# Patient Record
Sex: Female | Born: 1950
Health system: Southern US, Community
[De-identification: ages and names within clinical notes are randomized; demographics above are authoritative.]

## PROBLEM LIST (undated history)

## (undated) DIAGNOSIS — G542 Cervical root disorders, not elsewhere classified: Secondary | ICD-10-CM

## (undated) DIAGNOSIS — M940 Chondrocostal junction syndrome [Tietze]: Secondary | ICD-10-CM

## (undated) DIAGNOSIS — M810 Age-related osteoporosis without current pathological fracture: Secondary | ICD-10-CM

## (undated) DIAGNOSIS — M5090 Cervical disc disorder, unspecified, unspecified cervical region: Secondary | ICD-10-CM

## (undated) DIAGNOSIS — K219 Gastro-esophageal reflux disease without esophagitis: Secondary | ICD-10-CM

## (undated) HISTORY — PX: TOOTH EXTRACTION: SUR596

## (undated) HISTORY — DX: Cervical disc disorder, unspecified, unspecified cervical region: M50.90

## (undated) HISTORY — DX: Cervical root disorders, not elsewhere classified: G54.2

## (undated) HISTORY — DX: Chondrocostal junction syndrome (tietze): M94.0

## (undated) HISTORY — DX: Age-related osteoporosis without current pathological fracture: M81.0

## (undated) HISTORY — PX: ESOPHAGOGASTRODUODENOSCOPY (EGD) WITH PROPOFOL: SHX5813

## (undated) HISTORY — PX: GUM SURGERY: SHX658

---

## 2001-10-05 ENCOUNTER — Encounter: Admission: RE | Admit: 2001-10-05 | Discharge: 2001-10-05 | Payer: Self-pay | Admitting: Otolaryngology

## 2001-10-05 ENCOUNTER — Encounter: Payer: Self-pay | Admitting: Otolaryngology

## 2004-11-14 ENCOUNTER — Ambulatory Visit: Payer: Self-pay | Admitting: Gastroenterology

## 2006-01-12 ENCOUNTER — Ambulatory Visit: Payer: Self-pay

## 2007-01-14 ENCOUNTER — Ambulatory Visit: Payer: Self-pay

## 2007-06-23 ENCOUNTER — Emergency Department: Payer: Self-pay | Admitting: Emergency Medicine

## 2007-06-25 ENCOUNTER — Emergency Department: Payer: Self-pay | Admitting: Emergency Medicine

## 2007-10-12 ENCOUNTER — Ambulatory Visit: Payer: Self-pay | Admitting: Family Medicine

## 2009-01-25 ENCOUNTER — Ambulatory Visit: Payer: Self-pay

## 2010-02-26 ENCOUNTER — Ambulatory Visit: Payer: Self-pay | Admitting: Family Medicine

## 2010-04-01 ENCOUNTER — Ambulatory Visit: Payer: Self-pay | Admitting: Gastroenterology

## 2010-07-30 ENCOUNTER — Ambulatory Visit: Payer: Self-pay

## 2012-02-04 ENCOUNTER — Ambulatory Visit: Payer: Self-pay

## 2013-03-24 ENCOUNTER — Ambulatory Visit: Payer: Self-pay | Admitting: Family Medicine

## 2013-12-05 ENCOUNTER — Ambulatory Visit: Payer: Self-pay | Admitting: Family Medicine

## 2014-06-20 ENCOUNTER — Ambulatory Visit: Payer: Self-pay | Admitting: Family Medicine

## 2014-06-30 ENCOUNTER — Ambulatory Visit: Payer: Self-pay | Admitting: Family Medicine

## 2014-08-18 ENCOUNTER — Ambulatory Visit: Payer: Self-pay | Admitting: Family Medicine

## 2015-05-17 ENCOUNTER — Ambulatory Visit: Payer: Self-pay | Admitting: Family Medicine

## 2015-06-04 ENCOUNTER — Ambulatory Visit: Payer: Self-pay | Admitting: Family Medicine

## 2015-06-11 ENCOUNTER — Ambulatory Visit (INDEPENDENT_AMBULATORY_CARE_PROVIDER_SITE_OTHER): Payer: BLUE CROSS/BLUE SHIELD | Admitting: Family Medicine

## 2015-06-11 ENCOUNTER — Encounter: Payer: Self-pay | Admitting: Family Medicine

## 2015-06-11 VITALS — BP 122/78 | HR 86 | Temp 98.0°F | Resp 16 | Ht 68.0 in | Wt 148.1 lb

## 2015-06-11 DIAGNOSIS — M8949 Other hypertrophic osteoarthropathy, multiple sites: Secondary | ICD-10-CM

## 2015-06-11 DIAGNOSIS — M67432 Ganglion, left wrist: Secondary | ICD-10-CM | POA: Diagnosis not present

## 2015-06-11 DIAGNOSIS — M159 Polyosteoarthritis, unspecified: Secondary | ICD-10-CM

## 2015-06-11 DIAGNOSIS — M150 Primary generalized (osteo)arthritis: Secondary | ICD-10-CM | POA: Diagnosis not present

## 2015-06-11 DIAGNOSIS — M71332 Other bursal cyst, left wrist: Secondary | ICD-10-CM

## 2015-06-11 DIAGNOSIS — M71339 Other bursal cyst, unspecified wrist: Secondary | ICD-10-CM | POA: Insufficient documentation

## 2015-06-11 MED ORDER — DICLOFENAC SODIUM 75 MG PO TBEC: 75.0000 mg | DELAYED_RELEASE_TABLET | Freq: Two times a day (BID) | ORAL | Status: DC

## 2015-06-11 NOTE — Patient Instructions (Addendum)
Osteoarthritis Osteoarthritis is a disease that causes soreness and inflammation of a joint. It occurs when the cartilage at the affected joint wears down. Cartilage acts as a cushion, covering the ends of bones where they meet to form a joint. Osteoarthritis is the most common form of arthritis. It often occurs in older people. The joints affected most often by this condition include those in the:  Ends of the fingers.  Thumbs.  Neck.  Lower back.  Knees.  Hips. CAUSES  Over time, the cartilage that covers the ends of bones begins to wear away. This causes bone to rub on bone, producing pain and stiffness in the affected joints.  RISK FACTORS Certain factors can increase your chances of having osteoarthritis, including:  Older age.  Excessive body weight.  Overuse of joints.  Previous joint injury. SIGNS AND SYMPTOMS   Pain, swelling, and stiffness in the joint.  Over time, the joint may lose its normal shape.  Small deposits of bone (osteophytes) may grow on the edges of the joint.  Bits of bone or cartilage can break off and float inside the joint space. This may cause more pain and damage. DIAGNOSIS  Your health care provider will do a physical exam and ask about your symptoms. Various tests may be ordered, such as:  X-rays of the affected joint.  An MRI scan.  Blood tests to rule out other types of arthritis.  Joint fluid tests. This involves using a needle to draw fluid from the joint and examining the fluid under a microscope. TREATMENT  Goals of treatment are to control pain and improve joint function. Treatment plans may include:  A prescribed exercise program that allows for rest and joint relief.  A weight control plan.  Pain relief techniques, such as:  Properly applied heat and cold.  Electric pulses delivered to nerve endings under the skin (transcutaneous electrical nerve stimulation [TENS]).  Massage.  Certain nutritional  supplements.  Medicines to control pain, such as:  Acetaminophen.  Nonsteroidal anti-inflammatory drugs (NSAIDs), such as naproxen.  Narcotic or central-acting agents, such as tramadol.  Corticosteroids. These can be given orally or as an injection.  Surgery to reposition the bones and relieve pain (osteotomy) or to remove loose pieces of bone and cartilage. Joint replacement may be needed in advanced states of osteoarthritis. HOME CARE INSTRUCTIONS   Take medicines only as directed by your health care provider.  Maintain a healthy weight. Follow your health care provider's instructions for weight control. This may include dietary instructions.  Exercise as directed. Your health care provider can recommend specific types of exercise. These may include:  Strengthening exercises. These are done to strengthen the muscles that support joints affected by arthritis. They can be performed with weights or with exercise bands to add resistance.  Aerobic activities. These are exercises, such as brisk walking or low-impact aerobics, that get your heart pumping.  Range-of-motion activities. These keep your joints limber.  Balance and agility exercises. These help you maintain daily living skills.  Rest your affected joints as directed by your health care provider.  Keep all follow-up visits as directed by your health care provider. SEEK MEDICAL CARE IF:   Your skin turns red.  You develop a rash in addition to your joint pain.  You have worsening joint pain.  You have a fever along with joint or muscle aches. SEEK IMMEDIATE MEDICAL CARE IF:  You have a significant loss of weight or appetite.  You have night sweats. FOR MORE  [place] of Arthritis and Musculoskeletal and Skin Diseases: www.niams.SouthExposed.es  [employer] on Aging: http://kim-miller.com/  American College of Rheumatology: www.rheumatology.org Document Released: 11/03/2005 Document Revised:  03/20/2014 Document Reviewed: 07/11/2013 [facility] Patient Information 2015 [place], [place]. This information is not intended to replace advice given to you by your health care provider. Make sure you discuss any questions you have with your health care provider. Ganglion Cyst A ganglion cyst is a noncancerous, fluid-filled lump that occurs near joints or tendons. The ganglion cyst grows out of a joint or the lining of a tendon. It most often develops in the hand or wrist but can also develop in the shoulder, elbow, hip, knee, ankle, or foot. The round or oval ganglion can be pea sized or larger than a grape. Increased activity may enlarge the size of the cyst because more fluid starts to build up.  CAUSES  It is not completely known what causes a ganglion cyst to grow. However, it may be related to:  Inflammation or irritation around the joint.  An injury.  Repetitive movements or overuse.  Arthritis. SYMPTOMS  A lump most often appears in the hand or wrist, but can occur in other areas of the body. Generally, the lump is painless without other symptoms. However, sometimes pain can be felt during activity or when pressure is applied to the lump. The lump may even be tender to the touch. Tingling, pain, numbness, or muscle weakness can occur if the ganglion cyst presses on a nerve. Your grip may be weak and you may have less movement in your joints.  DIAGNOSIS  Ganglion cysts are most often diagnosed based on a physical exam, noting where the cyst is and how it looks. Your caregiver will feel the lump and may shine a light alongside it. If it is a ganglion, a light often shines through it. Your caregiver may order an X-ray, ultrasound, or MRI to rule out other conditions. TREATMENT  Ganglions usually go away on their own without treatment. If pain or other symptoms are involved, treatment may be needed. Treatment is also needed if the ganglion limits your movement or if it gets infected. Treatment  options include:  Wearing a wrist or finger brace or splint.  Taking anti-inflammatory medicine.  Draining fluid from the lump with a needle (aspiration).  Injecting a steroid into the joint.  Surgery to remove the ganglion cyst and its stalk that is attached to the joint or tendon. However, ganglion cysts can grow back. HOME CARE INSTRUCTIONS   Do not press on the ganglion, poke it with a needle, or hit it with a heavy object. You may rub the lump gently and often. Sometimes fluid moves out of the cyst.  Only take medicines as directed by your caregiver.  Wear your brace or splint as directed by your caregiver. SEEK MEDICAL CARE IF:   Your ganglion becomes larger or more painful.  You have increased redness, red streaks, or swelling.  You have pus coming from the lump.  You have weakness or numbness in the affected area. MAKE SURE YOU:   Understand these instructions.  Will watch your condition.  Will get help right away if you are not doing well or get worse. Document Released: 10/31/2000 Document Revised: 07/28/2012 Document Reviewed: 12/28/2007 [facility] Patient Information 2015 [place], [place]. This information is not intended to replace advice given to you by your health care provider. Make sure you discuss any questions you have with your health care provider.

## 2015-06-11 NOTE — Progress Notes (Signed)
Name: Denise Perez   MRN: [id number]    DOB: 1951-08-22   Date:06/11/2015       Progress Note  Subjective  Chief Complaint  Chief Complaint  Patient presents with  . Mass    Pt has lump on her left hand. Noticed 6 weeks ago.    Hand Pain  There was no injury mechanism. The pain is present in the left wrist and left fingers. The quality of the pain is described as aching. The pain does not radiate. The pain is mild. The pain has been fluctuating since the incident. Pertinent negatives include no chest pain or tingling. The symptoms are aggravated by lifting. She has tried nothing for the symptoms. The treatment provided no relief.   there are  and osteophytic changes of the left fifth DIP joint  Synovial cyst  Patient complains of a knot on the dorsum of her left wrist which she usually only notes when she is flexing the wrist. It is not painful. Objective there is a 2 cm synovial cyst on the dorsum of the left wrist. There is no pain to palpation. There is no joint inflammation noted there.  Past Medical History  Diagnosis Date  . Osteoporosis     History  Substance Use Topics  . Smoking status: Former Research scientist (life sciences)  . Smokeless tobacco: Former Systems developer    Quit date: 06/10/1986  . Alcohol Use: No     Current outpatient prescriptions:  .  calcium-vitamin D (OSCAL WITH D) 500-200 MG-UNIT per tablet, Take 1 tablet by mouth., Disp: , Rfl:   No Known Allergies  Review of Systems  Constitutional: Negative for fever, chills and weight loss.  HENT: Negative for congestion, hearing loss, sore throat and tinnitus.   Eyes: Negative for blurred vision, double vision and redness.  Respiratory: Negative for cough, hemoptysis and shortness of breath.   Cardiovascular: Negative for chest pain, palpitations, orthopnea, claudication and leg swelling.  Gastrointestinal: Negative for heartburn, nausea, vomiting, diarrhea, constipation and blood in stool.  Genitourinary: Negative for dysuria,  urgency, frequency and hematuria.  Musculoskeletal: Positive for joint pain. Negative for myalgias, falls and neck pain.       Synovial cyst on the dorsum of the left wrist measuring about 2 cm it is nontender to palpation  Skin: Negative for itching.  Neurological: Negative for dizziness, tingling, tremors, focal weakness, seizures, loss of consciousness, weakness and headaches.  Endo/Heme/Allergies: Does not bruise/bleed easily.  Psychiatric/Behavioral: Negative for depression and substance abuse. The patient is not nervous/anxious and does not have insomnia.      Objective  Filed Vitals:   06/11/15 0846  BP: 122/78  Pulse: 86  Temp: 98 F (36.7 C)  Resp: 16  Height: 5\' 8"  (1.727 m)  Weight: 148 lb 2 oz (67.189 kg)  SpO2: 96%     Physical Exam  Constitutional: She is oriented to person, place, and time and well-developed, well-nourished, and in no distress.  HENT:  Head: Normocephalic.  Eyes: EOM are normal. Pupils are equal, round, and reactive to light.  Neck: Normal range of motion. No thyromegaly present.  Cardiovascular: Normal rate, regular rhythm and normal heart sounds.   No murmur heard. Pulmonary/Chest: Effort normal and breath sounds normal.  Abdominal: Soft. Bowel sounds are normal.  Musculoskeletal: Normal range of motion. She exhibits tenderness. She exhibits no edema.  2 cm synovial cyst on the dorsum of the left wrist and also the left fifth MTP joint shows arthritic changes with hypertrophy and  MILD ANGULATION  Neurological: She is alert and oriented to person, place, and time. No cranial nerve deficit. Gait normal.  Skin: Skin is warm and dry. No rash noted.  Psychiatric: Memory and affect normal.      Assessment & Plan  1. Primary osteoarthritis involving multiple joints HANDOUT  2. Synovial cyst of wrist, left HANDOUT

## 2015-08-13 ENCOUNTER — Encounter: Payer: Self-pay | Admitting: *Deleted

## 2015-08-13 ENCOUNTER — Ambulatory Visit: Payer: BLUE CROSS/BLUE SHIELD | Admitting: Anesthesiology

## 2015-08-13 ENCOUNTER — Encounter
Admission: RE | Disposition: A | Discharge status: Home / Self Care | Payer: Self-pay | Source: Ambulatory Visit | Attending: Gastroenterology

## 2015-08-13 ENCOUNTER — Ambulatory Visit
Admission: RE | Admit: 2015-08-13 | Discharge: 2015-08-13 | Disposition: A | Discharge status: Home / Self Care | Payer: BLUE CROSS/BLUE SHIELD | Source: Ambulatory Visit | Attending: Gastroenterology | Admitting: Gastroenterology

## 2015-08-13 DIAGNOSIS — Z87891 Personal history of nicotine dependence: Secondary | ICD-10-CM | POA: Insufficient documentation

## 2015-08-13 DIAGNOSIS — Z800 Family history of malignant neoplasm of digestive organs: Secondary | ICD-10-CM | POA: Insufficient documentation

## 2015-08-13 DIAGNOSIS — Z1211 Encounter for screening for malignant neoplasm of colon: Secondary | ICD-10-CM | POA: Insufficient documentation

## 2015-08-13 DIAGNOSIS — M810 Age-related osteoporosis without current pathological fracture: Secondary | ICD-10-CM | POA: Diagnosis not present

## 2015-08-13 DIAGNOSIS — Z79899 Other long term (current) drug therapy: Secondary | ICD-10-CM | POA: Diagnosis not present

## 2015-08-13 HISTORY — PX: COLONOSCOPY WITH PROPOFOL: SHX5780

## 2015-08-13 SURGERY — COLONOSCOPY WITH PROPOFOL
Anesthesia: General

## 2015-08-13 MED ORDER — SODIUM CHLORIDE 0.9 % IV SOLN: INTRAVENOUS | Status: DC

## 2015-08-13 MED ORDER — PROPOFOL 10 MG/ML IV BOLUS
INTRAVENOUS | Status: DC | PRN
  Administered 2015-08-13: 80 mg via INTRAVENOUS

## 2015-08-13 MED ORDER — LIDOCAINE HCL (CARDIAC) 20 MG/ML IV SOLN
INTRAVENOUS | Status: DC | PRN
  Administered 2015-08-13: 30 mg via INTRAVENOUS

## 2015-08-13 MED ORDER — PROPOFOL 500 MG/50ML IV EMUL
INTRAVENOUS | Status: DC | PRN
  Administered 2015-08-13: 125 ug/kg/min via INTRAVENOUS

## 2015-08-13 MED ORDER — SODIUM CHLORIDE 0.9 % IV SOLN
INTRAVENOUS | Status: DC
  Administered 2015-08-13: 1000 mL via INTRAVENOUS

## 2015-08-13 NOTE — H&P (Signed)
    Primary Care Physician:  Ashok Norris, MD Primary Gastroenterologist:  Dr. Candace Cruise  Pre-Procedure History & Physical: HPI:  Denise Perez is a 64 y.o. female is here for an colonoscopy.   Past Medical History  Diagnosis Date  . Osteoporosis     Past Surgical History  Procedure Laterality Date  . Gum surgery      Prior to Admission medications   Medication Sig Start Date End Date Taking? Authorizing Provider  calcium-vitamin D (OSCAL WITH D) 500-200 MG-UNIT per tablet Take 1 tablet by mouth.    Historical Provider, MD  diclofenac (VOLTAREN) 75 MG EC tablet Take 1 tablet (75 mg total) by mouth 2 (two) times daily. Patient not taking: Reported on 08/13/2015 06/11/15   Ashok Norris, MD    Allergies as of 07/03/2015  . (No Known Allergies)    Family History  Problem Relation Age of Onset  . Cancer Father     Social History   Social History  . Marital Status: Married    Spouse Name: N/A  . Number of Children: N/A  . Years of Education: N/A   Occupational History  . Not on file.   Social History Main Topics  . Smoking status: Former Research scientist (life sciences)  . Smokeless tobacco: Former Systems developer    Quit date: 06/10/1986  . Alcohol Use: No  . Drug Use: No  . Sexual Activity: Not on file   Other Topics Concern  . Not on file   Social History Narrative    Review of Systems: See HPI, otherwise negative ROS  Physical Exam: BP 127/63 mmHg  Pulse 75  Temp(Src) 97.6 F (36.4 C) (Oral)  Resp 18  Ht 5\' 6"  (1.676 m)  Wt 65.772 kg (145 lb)  BMI 23.41 kg/m2  SpO2 97% General:   Alert,  pleasant and cooperative in NAD Head:  Normocephalic and atraumatic. Neck:  Supple; no masses or thyromegaly. Lungs:  Clear throughout to auscultation.    Heart:  Regular rate and rhythm. Abdomen:  Soft, nontender and nondistended. Normal bowel sounds, without guarding, and without rebound.   Neurologic:  Alert and  oriented x4;  grossly normal neurologically.  Impression/Plan: Denise Perez is here for an colonoscopy to be performed for family hx of colon cancer.  Risks, benefits, limitations, and alternatives regarding  colonoscopy have been reviewed with the patient.  Questions have been answered.  All parties agreeable.   Shealeigh Dunstan, Lupita Dawn, MD  08/13/2015, 7:49 AM

## 2015-08-13 NOTE — Anesthesia Preprocedure Evaluation (Signed)
Anesthesia Evaluation  Patient identified by MRN, date of birth, ID band Patient awake    Reviewed: Allergy & Precautions, H&P , NPO status , Patient's Chart, lab work & pertinent test results, reviewed documented beta blocker date and time   History of Anesthesia Complications Negative for: history of anesthetic complications  Airway Mallampati: II  TM Distance: >3 FB Neck ROM: full    Dental no notable dental hx. (+) Partial Upper, Teeth Intact   Pulmonary neg pulmonary ROS, former smoker,    Pulmonary exam normal breath sounds clear to auscultation       Cardiovascular Exercise Tolerance: Good negative cardio ROS Normal cardiovascular exam Rhythm:regular Rate:Normal     Neuro/Psych negative neurological ROS  negative psych ROS   GI/Hepatic negative GI ROS, Neg liver ROS,   Endo/Other  negative endocrine ROS  Renal/GU negative Renal ROS  negative genitourinary   Musculoskeletal   Abdominal   Peds  Hematology negative hematology ROS (+)   Anesthesia Other Findings Past Medical History:   Osteoporosis                                                 Reproductive/Obstetrics negative OB ROS                             Anesthesia Physical Anesthesia Plan  ASA: II  Anesthesia Plan: General   Post-op Pain Management:    Induction:   Airway Management Planned:   Additional Equipment:   Intra-op Plan:   Post-operative Plan:   Informed Consent: I have reviewed the patients History and Physical, chart, labs and discussed the procedure including the risks, benefits and alternatives for the proposed anesthesia with the patient or authorized representative who has indicated his/her understanding and acceptance.   Dental Advisory Given  Plan Discussed with: Anesthesiologist, CRNA and Surgeon  Anesthesia Plan Comments:         Anesthesia Quick Evaluation

## 2015-08-13 NOTE — Transfer of Care (Signed)
Immediate Anesthesia Transfer of Care Note  Patient: Denise Perez Tine  Procedure(s) Performed: Procedure(s): COLONOSCOPY WITH PROPOFOL (N/A)  Patient Location: Endoscopy Unit  Anesthesia Type:General  Level of Consciousness: sedated  Airway & Oxygen Therapy: Patient Spontanous Breathing  Post-op Assessment: Report given to RN and Post -op Vital signs reviewed and stable  Post vital signs: Reviewed and stable  Last Vitals:  Filed Vitals:   08/13/15 0729  BP: 127/63  Pulse: 75  Temp: 36.4 C  Resp: 18    Complications: No apparent anesthesia complications

## 2015-08-13 NOTE — Op Note (Signed)
[facility] Gastroenterology Patient Name: Denise Perez Tine Procedure Date: 08/13/2015 7:55 AM MRN: [id number] Account #: [id number] Date of Birth: [birth year] Admit Type: Outpatient Age: 64 Room: [facility] ENDO ROOM 4 Gender: Female Note Status: Finalized Procedure:         Colonoscopy Indications:       Screening in patient at increased risk: Family history of                     1st-degree relative with colorectal cancer Providers:         Lupita Dawn. Candace Cruise, MD Referring MD:      Ashok Norris, MD (Referring MD) Medicines:         Monitored Anesthesia Care Complications:     No immediate complications. Procedure:         Pre-Anesthesia Assessment:                    - Prior to the procedure, a History and Physical was                     performed, and patient medications, allergies and                     sensitivities were reviewed. The patient's tolerance of                     previous anesthesia was reviewed.                    - The risks and benefits of the procedure and the sedation                     options and risks were discussed with the patient. All                     questions were answered and informed consent was obtained.                    - After reviewing the risks and benefits, the patient was                     deemed in satisfactory condition to undergo the procedure.                    After obtaining informed consent, the colonoscope was                     passed under direct vision. Throughout the procedure, the                     patient's blood pressure, pulse, and oxygen saturations                     were monitored continuously. The Colonoscope was                     introduced through the anus and advanced to the the cecum,                     identified by appendiceal orifice and ileocecal valve. The                     colonoscopy was performed without difficulty. The patient  tolerated the procedure well. The  quality of the bowel                     preparation was good. Findings:      The colon (entire examined portion) appeared normal. Impression:        - The entire examined colon is normal.                    - No specimens collected. Recommendation:    - Discharge patient to home.                    - Repeat colonoscopy in 5 years for surveillance.                    - The findings and recommendations were discussed with the                     patient. Procedure Code(s): --- Professional ---                    [phone], Colonoscopy, flexible; diagnostic, including                     collection of specimen(s) by brushing or washing, when                     performed (separate procedure) Diagnosis Code(s): --- Professional ---                    Z80.0, Family history of malignant neoplasm of digestive                     organs CPT copyright 2014 American Medical Association. All rights reserved. The codes documented in this report are preliminary and upon coder review may  be revised to meet current compliance requirements. Hulen Luster, MD 08/13/2015 8:27:40 AM This report has been signed electronically. Number of Addenda: 0 Note Initiated On: 08/13/2015 7:55 AM Scope Withdrawal Time: 0 hours 4 minutes 49 seconds  Total Procedure Duration: 0 hours 11 minutes 42 seconds       [facility]

## 2015-08-14 ENCOUNTER — Encounter: Payer: Self-pay | Admitting: Gastroenterology

## 2015-08-15 NOTE — Anesthesia Postprocedure Evaluation (Signed)
  Anesthesia Post-op Note  Patient: Denise Perez  Procedure(s) Performed: Procedure(s): COLONOSCOPY WITH PROPOFOL (N/A)  Anesthesia type:General  Patient location: PACU  Post pain: Pain level controlled  Post assessment: Post-op Vital signs reviewed, Patient's Cardiovascular Status Stable, Respiratory Function Stable, Patent Airway and No signs of Nausea or vomiting  Post vital signs: Reviewed and stable  Last Vitals:  Filed Vitals:   08/13/15 0900  BP: 124/77  Pulse: 54  Temp:   Resp: 16    Level of consciousness: awake, alert  and patient cooperative  Complications: No apparent anesthesia complications

## 2015-10-15 ENCOUNTER — Encounter: Payer: Self-pay | Admitting: Family Medicine

## 2015-10-15 ENCOUNTER — Ambulatory Visit (INDEPENDENT_AMBULATORY_CARE_PROVIDER_SITE_OTHER): Payer: PPO | Admitting: Family Medicine

## 2015-10-15 VITALS — BP 110/60 | HR 96 | Temp 99.0°F | Resp 16 | Ht 66.0 in | Wt 148.6 lb

## 2015-10-15 DIAGNOSIS — T732XXA Exhaustion due to exposure, initial encounter: Secondary | ICD-10-CM

## 2015-10-15 DIAGNOSIS — J0100 Acute maxillary sinusitis, unspecified: Secondary | ICD-10-CM | POA: Diagnosis not present

## 2015-10-15 DIAGNOSIS — J40 Bronchitis, not specified as acute or chronic: Secondary | ICD-10-CM

## 2015-10-15 MED ORDER — AMOXICILLIN-POT CLAVULANATE 875-125 MG PO TABS: 1.0000 | ORAL_TABLET | Freq: Two times a day (BID) | ORAL | Status: DC

## 2015-10-15 NOTE — Progress Notes (Signed)
Name: Denise Perez   MRN: [id number]    DOB: 1951-01-11   Date:10/15/2015       Progress Note  Subjective  Chief Complaint  Chief Complaint  Patient presents with  . URI    cough, congested, weak for 10 days  . Sore Throat    HPI  Bronchitis  Patient presents with a greater than 1 week history of cough productive of purulent sputum. The cough is irritating and keep the patient awake at night. There has no fever or chills.  Over-the-counter meds And completely effective.  Sinusitis  Patient presents with greater than 7 day history of nasal congestion and drainage which is purulent in color. There is tenderness over the sinuses. There has been fever to 99 along with some associated chills on occasion. Usage of over-the-counter medications is not been affected. There is also accompanying cough productive of purulent sputum.  Fatigue  Patient complains of fatigue for the past several days. She describes almost overwhelming fatigue which is present most every day. There is no history of any chest pain or shortness of breath. There is no history of any thyroid disease. On further questioning is discovered the patient has taken over-the-counter cough and cold preparation which contains an bromide containing antihistamine Past Medical History  Diagnosis Date  . Osteoporosis     Social History  Substance Use Topics  . Smoking status: Former Research scientist (life sciences)  . Smokeless tobacco: Former Systems developer    Quit date: 06/10/1986  . Alcohol Use: No     Current outpatient prescriptions:  .  calcium-vitamin D (OSCAL WITH D) 500-200 MG-UNIT per tablet, Take 1 tablet by mouth., Disp: , Rfl:  .  diclofenac (VOLTAREN) 75 MG EC tablet, Take 1 tablet (75 mg total) by mouth 2 (two) times daily. (Patient not taking: Reported on 08/13/2015), Disp: 30 tablet, Rfl: 0  No Known Allergies  Review of Systems  Constitutional: Positive for fever, chills and malaise/fatigue. Negative for weight loss.  HENT: Positive  for congestion. Negative for hearing loss, sore throat and tinnitus.   Eyes: Negative for blurred vision, double vision and redness.  Respiratory: Positive for cough. Negative for hemoptysis, sputum production, shortness of breath and wheezing.   Cardiovascular: Negative for chest pain, palpitations, orthopnea, claudication and leg swelling.  Gastrointestinal: Negative for heartburn, nausea, vomiting, diarrhea, constipation and blood in stool.  Genitourinary: Negative for dysuria, urgency, frequency and hematuria.  Musculoskeletal: Negative for myalgias, back pain, joint pain, falls and neck pain.  Skin: Negative for itching.  Neurological: Negative for dizziness, tingling, tremors, focal weakness, seizures, loss of consciousness, weakness and headaches.  Endo/Heme/Allergies: Does not bruise/bleed easily.  Psychiatric/Behavioral: Negative for depression and substance abuse. The patient is not nervous/anxious and does not have insomnia.        Increased somnolence     Objective  Filed Vitals:   10/15/15 1449  BP: 110/60  Pulse: 96  Temp: 99 F (37.2 C)  TempSrc: Oral  Resp: 16  Height: 5\' 6"  (1.676 m)  Weight: 148 lb 9.6 oz (67.405 kg)  SpO2: 94%     Physical Exam  Constitutional: She is oriented to person, place, and time and well-developed, well-nourished, and in no distress.  HENT:  Nasal turbinates are swollen with purulent discharge and tenderness over the maxillary sinuses  Eyes: EOM are normal. Pupils are equal, round, and reactive to light.  Neck: Normal range of motion. No thyromegaly present.  Cardiovascular: Normal rate, regular rhythm and normal heart sounds.  No murmur heard. Pulmonary/Chest: Effort normal and breath sounds normal.  Musculoskeletal: Normal range of motion. She exhibits no edema.  Neurological: She is alert and oriented to person, place, and time. No cranial nerve deficit. Gait normal.  Skin: Skin is warm and dry. No rash noted.  Psychiatric:  Memory and affect normal.      Assessment & Plan  1. Acute maxillary sinusitis, recurrence not specified Over-the-counter Flonase if she can tolerate  - amoxicillin-clavulanate (AUGMENTIN) 875-125 MG tablet; Take 1 tablet by mouth 2 (two) times daily.  Dispense: 20 tablet; Refill: 0  2. Bronchitis OTC Delsym - amoxicillin-clavulanate (AUGMENTIN) 875-125 MG tablet; Take 1 tablet by mouth 2 (two) times daily.  Dispense: 20 tablet; Refill: 0  3. Fatigue due to exposure, initial encounter Discontinue her current antihistamine as seroma containing antihistamine cough preparations are well known to cause significant sedation if it persists after discontinuation to come back for consideration for CBC and TSH

## 2015-10-16 ENCOUNTER — Ambulatory Visit: Payer: BLUE CROSS/BLUE SHIELD | Admitting: Family Medicine

## 2015-11-06 ENCOUNTER — Ambulatory Visit
Admission: EM | Admit: 2015-11-06 | Discharge: 2015-11-06 | Disposition: A | Discharge status: Home / Self Care | Payer: PPO | Attending: Family Medicine | Admitting: Family Medicine

## 2015-11-06 ENCOUNTER — Encounter: Payer: Self-pay | Admitting: *Deleted

## 2015-11-06 DIAGNOSIS — J069 Acute upper respiratory infection, unspecified: Secondary | ICD-10-CM | POA: Diagnosis not present

## 2015-11-06 MED ORDER — FEXOFENADINE-PSEUDOEPHED ER 60-120 MG PO TB12: 1.0000 | ORAL_TABLET | Freq: Two times a day (BID) | ORAL | Status: DC

## 2015-11-06 NOTE — ED Provider Notes (Signed)
CSN: [id number]     Arrival date & time 11/06/15  [id number] History   First MD Initiated Contact with Patient 11/06/15 1058    Nurses notes were reviewed. Chief Complaint  Patient presents with  . Nasal Congestion  . Cough  . Facial Pain   She is a business congestion cough facial pain. She is started having some symptoms similar to when she was treated with Augmentin recently. She denies any fever and symptoms is only started about a day ago.   (Consider location/radiation/quality/duration/timing/severity/associated sxs/prior Treatment) Patient is a 64 y.o. female presenting with cough.  Cough   Past Medical History  Diagnosis Date  . Osteoporosis    Past Surgical History  Procedure Laterality Date  . Gum surgery    . Colonoscopy with propofol N/A 08/13/2015    Procedure: COLONOSCOPY WITH PROPOFOL;  Surgeon: Hulen Luster, MD;  Location: [facility] ENDOSCOPY;  Service: Gastroenterology;  Laterality: N/A;   Family History  Problem Relation Age of Onset  . Cancer Father    Social History  Substance Use Topics  . Smoking status: Former Research scientist (life sciences)  . Smokeless tobacco: Former Systems developer    Quit date: 06/10/1986  . Alcohol Use: No   OB History    No data available     Review of Systems  Respiratory: Positive for cough.   All other systems reviewed and are negative.   Allergies  Review of patient's allergies indicates no known allergies.  Home Medications   Prior to Admission medications   Medication Sig Start Date End Date Taking? Authorizing Provider  calcium-vitamin D (OSCAL WITH D) 500-200 MG-UNIT per tablet Take 1 tablet by mouth.   Yes Historical Provider, MD  diclofenac (VOLTAREN) 75 MG EC tablet Take 1 tablet (75 mg total) by mouth 2 (two) times daily. Patient not taking: Reported on 08/13/2015 06/11/15   Ashok Norris, MD  fexofenadine-pseudoephedrine (ALLEGRA-D) 60-120 MG 12 hr tablet Take 1 tablet by mouth every 12 (twelve) hours. 11/06/15   Virginia Crews, MD   Meds  Ordered and Administered this Visit  Medications - No data to display  BP 135/55 mmHg  Pulse 76  Temp(Src) 97.9 F (36.6 C) (Oral)  Resp 20  Ht 5\' 6"  (1.676 m)  Wt 145 lb (65.772 kg)  BMI 23.41 kg/m2  SpO2 98% No data found.   Physical Exam  Constitutional: She is oriented to person, place, and time. She appears well-developed.  HENT:  Head: Normocephalic and atraumatic.  Eyes: Conjunctivae are normal. Pupils are equal, round, and reactive to light.  Musculoskeletal: Normal range of motion.  Neurological: She is alert and oriented to person, place, and time.  Skin: Skin is warm.  Psychiatric: She has a normal mood and affect.  Vitals reviewed.   ED Course  Procedures (including critical care time)  Labs Review Labs Reviewed - No data to display  Imaging Review No results found.   Visual Acuity Review  Right Eye Distance:   Left Eye Distance:   Bilateral Distance:    Right Eye Near:   Left Eye Near:    Bilateral Near:         MDM   1. Viral URI    This case with Dr. the who agrees the patient appears to be a viral) presentation. If she is not better in a few days to please return or contact PCP.      Frederich Cha, MD 11/06/15 2125

## 2015-11-06 NOTE — ED Provider Notes (Signed)
CSN: [id number]     Arrival date & time 11/06/15  [id number] History   First MD Initiated Contact with Patient 11/06/15 1058     Chief Complaint  Patient presents with  . Nasal Congestion  . Cough  . Facial Pain   (Consider location/radiation/quality/duration/timing/severity/associated sxs/prior Treatment) HPI   Denise Perez is a 64 y.o. female presenting for 3 days of nasal congestion.  She was recently treated for sinusitis and bronchitis with augmentin about 3 weeks ago.  She was given 10 day course of antibiotics, but reports she was told that she could stop after 5 or 7 days if symptoms resolved.  She took 7 of 10 day course.  She has been feeling well until 3 days ago, when she started getting clear rhinorrhea, intermittent dry cough, postnasal drip, and intermittent bilateral nasal congestion with associated facial pain.  She reports symptoms are much more mild than they were when she was previously treated for sinusitis and bronchitis.  Thinks cough is related to postnasal drip.  Past Medical History  Diagnosis Date  . Osteoporosis    Past Surgical History  Procedure Laterality Date  . Gum surgery    . Colonoscopy with propofol N/A 08/13/2015    Procedure: COLONOSCOPY WITH PROPOFOL;  Surgeon: Hulen Luster, MD;  Location: [facility] ENDOSCOPY;  Service: Gastroenterology;  Laterality: N/A;   Family History  Problem Relation Age of Onset  . Cancer Father    Social History  Substance Use Topics  . Smoking status: Former Research scientist (life sciences)  . Smokeless tobacco: Former Systems developer    Quit date: 06/10/1986  . Alcohol Use: No   OB History    No data available     Review of Systems  Constitutional: Negative for fever, chills, activity change and fatigue.  HENT: Positive for congestion, postnasal drip and rhinorrhea. Negative for ear pain, facial swelling, sore throat and trouble swallowing.   Eyes: Negative for pain, redness and itching.  Respiratory: Positive for cough. Negative for shortness of breath  and wheezing.   Cardiovascular: Negative for chest pain and leg swelling.  All other systems reviewed and are negative.   Allergies  Review of patient's allergies indicates no known allergies.  Home Medications   Prior to Admission medications   Medication Sig Start Date End Date Taking? Authorizing Provider  amoxicillin-clavulanate (AUGMENTIN) 875-125 MG tablet Take 1 tablet by mouth 2 (two) times daily. 10/15/15  Yes Ashok Norris, MD  calcium-vitamin D (OSCAL WITH D) 500-200 MG-UNIT per tablet Take 1 tablet by mouth.   Yes Historical Provider, MD  diclofenac (VOLTAREN) 75 MG EC tablet Take 1 tablet (75 mg total) by mouth 2 (two) times daily. Patient not taking: Reported on 08/13/2015 06/11/15   Ashok Norris, MD   Meds Ordered and Administered this Visit  Medications - No data to display  BP 135/55 mmHg  Pulse 76  Temp(Src) 97.9 F (36.6 C) (Oral)  Resp 20  Ht 5\' 6"  (1.676 m)  Wt 145 lb (65.772 kg)  BMI 23.41 kg/m2  SpO2 98% No data found.   Physical Exam  Constitutional: She is oriented to person, place, and time. She appears well-developed and well-nourished. No distress.  HENT:  Head: Normocephalic and atraumatic.  Mouth/Throat: Oropharynx is clear and moist. No oropharyngeal exudate.  TMs clear b/l Nasal turbinates boggy, no purulent discharge No facial tenderness to palpation  Eyes: Conjunctivae are normal. Pupils are equal, round, and reactive to light. No scleral icterus.  Neck: Normal range of motion.  Neck supple.  Cardiovascular: Normal rate, regular rhythm, normal heart sounds and intact distal pulses.   No murmur heard. Pulmonary/Chest: Effort normal and breath sounds normal. No respiratory distress. She has no wheezes.  Musculoskeletal: She exhibits no edema or tenderness.  Lymphadenopathy:    She has no cervical adenopathy.  Neurological: She is alert and oriented to person, place, and time.  Skin: Skin is warm and dry. No rash noted.  Psychiatric:  She has a normal mood and affect. Her behavior is normal.    ED Course  Procedures (including critical care time)  Labs Review Labs Reviewed - No data to display  Imaging Review No results found.   Visual Acuity Review  Right Eye Distance:   Left Eye Distance:   Bilateral Distance:    Right Eye Near:   Left Eye Near:    Bilateral Near:         MDM   1. Viral URI    Given short duration of symptoms, afebrile, and lack of purulent drainage, doubt acute bacterial sinusitis.  Symptoms more consistent with viral URI.  Explained expected course, return precautions.  Will prescribe Allegra D for decongestant.  Virginia Crews, MD, MPH PGY-2,  [facility] 11/06/2015 11:37 AM   Virginia Crews, MD 11/06/15 [phone]

## 2015-11-06 NOTE — ED Notes (Signed)
Patient started having nasal congestion and sinus pain 1 week ago. Color of mucus in mostly clear with the occasional green yellow coloring. Patient has recently been treated for sinus and chest congestion.

## 2015-11-06 NOTE — Discharge Instructions (Signed)
Upper Respiratory Infection, Adult Most upper respiratory infections (URIs) are a viral infection of the air passages leading to the lungs. A URI affects the nose, throat, and upper air passages. The most common type of URI is nasopharyngitis and is typically referred to as "the common cold." URIs run their course and usually go away on their own. Most of the time, a URI does not require medical attention, but sometimes a bacterial infection in the upper airways can follow a viral infection. This is called a secondary infection. Sinus and middle ear infections are common types of secondary upper respiratory infections. Bacterial pneumonia can also complicate a URI. A URI can worsen asthma and chronic obstructive pulmonary disease (COPD). Sometimes, these complications can require emergency medical care and may be life threatening.  CAUSES Almost all URIs are caused by viruses. A virus is a type of germ and can spread from one person to another.  RISKS FACTORS You may be at risk for a URI if:   You smoke.   You have chronic heart or lung disease.  You have a weakened defense (immune) system.   You are very young or very old.   You have nasal allergies or asthma.  You work in crowded or poorly ventilated areas.  You work in health care facilities or schools. SIGNS AND SYMPTOMS  Symptoms typically develop 2-3 days after you come in contact with a cold virus. Most viral URIs last 7-10 days. However, viral URIs from the influenza virus (flu virus) can last 14-18 days and are typically more severe. Symptoms may include:   Runny or stuffy (congested) nose.   Sneezing.   Cough.   Sore throat.   Headache.   Fatigue.   Fever.   Loss of appetite.   Pain in your forehead, behind your eyes, and over your cheekbones (sinus pain).  Muscle aches.  DIAGNOSIS  Your health care provider may diagnose a URI by:  Physical exam.  Tests to check that your symptoms are not due to  another condition such as:  Strep throat.  Sinusitis.  Pneumonia.  Asthma. TREATMENT  A URI goes away on its own with time. It cannot be cured with medicines, but medicines may be prescribed or recommended to relieve symptoms. Medicines may help:  Reduce your fever.  Reduce your cough.  Relieve nasal congestion. HOME CARE INSTRUCTIONS   Take medicines only as directed by your health care provider.   Gargle warm saltwater or take cough drops to comfort your throat as directed by your health care provider.  Use a warm mist humidifier or inhale steam from a shower to increase air moisture. This may make it easier to breathe.  Drink enough fluid to keep your urine clear or pale yellow.   Eat soups and other clear broths and maintain good nutrition.   Rest as needed.   Return to work when your temperature has returned to normal or as your health care provider advises. You may need to stay home longer to avoid infecting others. You can also use a face mask and careful hand washing to prevent spread of the virus.  Increase the usage of your inhaler if you have asthma.   Do not use any tobacco products, including cigarettes, chewing tobacco, or electronic cigarettes. If you need help quitting, ask your health care provider. PREVENTION  The best way to protect yourself from getting a cold is to practice good hygiene.   Avoid oral or hand contact with people with cold   symptoms.   Wash your hands often if contact occurs.  There is no clear evidence that vitamin C, vitamin E, echinacea, or exercise reduces the chance of developing a cold. However, it is always recommended to get plenty of rest, exercise, and practice good nutrition.  SEEK MEDICAL CARE IF:   You are getting worse rather than better.   Your symptoms are not controlled by medicine.   You have chills.  You have worsening shortness of breath.  You have brown or red mucus.  You have yellow or brown nasal  discharge.  You have pain in your face, especially when you bend forward.  You have a fever.  You have swollen neck glands.  You have pain while swallowing.  You have white areas in the back of your throat. SEEK IMMEDIATE MEDICAL CARE IF:   You have severe or persistent:  Headache.  Ear pain.  Sinus pain.  Chest pain.  You have chronic lung disease and any of the following:  Wheezing.  Prolonged cough.  Coughing up blood.  A change in your usual mucus.  You have a stiff neck.  You have changes in your:  Vision.  Hearing.  Thinking.  Mood. MAKE SURE YOU:   Understand these instructions.  Will watch your condition.  Will get help right away if you are not doing well or get worse.   This information is not intended to replace advice given to you by your health care provider. Make sure you discuss any questions you have with your health care provider.   Document Released: 04/29/2001 Document Revised: 03/20/2015 Document Reviewed: 02/08/2014 Elsevier Interactive Patient Education 2016 [employer] Inc[employer]  

## 2015-11-22 DIAGNOSIS — H43811 Vitreous degeneration, right eye: Secondary | ICD-10-CM | POA: Diagnosis not present

## 2015-11-22 DIAGNOSIS — H00029 Hordeolum internum unspecified eye, unspecified eyelid: Secondary | ICD-10-CM | POA: Diagnosis not present

## 2015-11-28 DIAGNOSIS — Z1151 Encounter for screening for human papillomavirus (HPV): Secondary | ICD-10-CM | POA: Diagnosis not present

## 2015-11-28 DIAGNOSIS — Z01419 Encounter for gynecological examination (general) (routine) without abnormal findings: Secondary | ICD-10-CM | POA: Diagnosis not present

## 2015-11-28 DIAGNOSIS — Z1231 Encounter for screening mammogram for malignant neoplasm of breast: Secondary | ICD-10-CM | POA: Diagnosis not present

## 2015-11-28 LAB — HM MAMMOGRAPHY: HM Mammogram: NEGATIVE

## 2016-01-11 DIAGNOSIS — M818 Other osteoporosis without current pathological fracture: Secondary | ICD-10-CM | POA: Diagnosis not present

## 2016-01-11 DIAGNOSIS — E782 Mixed hyperlipidemia: Secondary | ICD-10-CM | POA: Diagnosis not present

## 2016-01-11 DIAGNOSIS — Z6824 Body mass index (BMI) 24.0-24.9, adult: Secondary | ICD-10-CM | POA: Diagnosis not present

## 2016-01-11 DIAGNOSIS — Z87891 Personal history of nicotine dependence: Secondary | ICD-10-CM | POA: Diagnosis not present

## 2016-01-11 DIAGNOSIS — M1990 Unspecified osteoarthritis, unspecified site: Secondary | ICD-10-CM | POA: Diagnosis not present

## 2016-01-11 DIAGNOSIS — Z0000 Encounter for general adult medical examination without abnormal findings: Secondary | ICD-10-CM | POA: Diagnosis not present

## 2016-01-28 ENCOUNTER — Encounter: Payer: Self-pay | Admitting: Family Medicine

## 2016-03-21 DIAGNOSIS — H43812 Vitreous degeneration, left eye: Secondary | ICD-10-CM | POA: Diagnosis not present

## 2016-04-22 DIAGNOSIS — H43812 Vitreous degeneration, left eye: Secondary | ICD-10-CM | POA: Diagnosis not present

## 2016-05-28 ENCOUNTER — Encounter: Payer: Self-pay | Admitting: Family Medicine

## 2016-05-28 ENCOUNTER — Ambulatory Visit (INDEPENDENT_AMBULATORY_CARE_PROVIDER_SITE_OTHER): Payer: PPO | Admitting: Family Medicine

## 2016-05-28 VITALS — BP 108/76 | HR 69 | Temp 97.5°F | Resp 16 | Ht 66.0 in | Wt 147.1 lb

## 2016-05-28 DIAGNOSIS — Z1159 Encounter for screening for other viral diseases: Secondary | ICD-10-CM | POA: Diagnosis not present

## 2016-05-28 DIAGNOSIS — R21 Rash and other nonspecific skin eruption: Secondary | ICD-10-CM | POA: Diagnosis not present

## 2016-05-28 DIAGNOSIS — M8949 Other hypertrophic osteoarthropathy, multiple sites: Secondary | ICD-10-CM

## 2016-05-28 DIAGNOSIS — Z23 Encounter for immunization: Secondary | ICD-10-CM | POA: Diagnosis not present

## 2016-05-28 DIAGNOSIS — M150 Primary generalized (osteo)arthritis: Secondary | ICD-10-CM

## 2016-05-28 DIAGNOSIS — M810 Age-related osteoporosis without current pathological fracture: Secondary | ICD-10-CM | POA: Diagnosis not present

## 2016-05-28 DIAGNOSIS — M159 Polyosteoarthritis, unspecified: Secondary | ICD-10-CM

## 2016-05-28 DIAGNOSIS — M5090 Cervical disc disorder, unspecified, unspecified cervical region: Secondary | ICD-10-CM | POA: Insufficient documentation

## 2016-05-28 DIAGNOSIS — E785 Hyperlipidemia, unspecified: Secondary | ICD-10-CM

## 2016-05-28 LAB — LIPID PANEL
Cholesterol: 211 mg/dL — ABNORMAL HIGH (ref 125–200)
HDL: 85 mg/dL (ref >=46)
LDL CALC: 113 mg/dL (ref <130)
TRIGLYCERIDES: 66 mg/dL (ref <150)
Total CHOL/HDL Ratio: 2.5 Ratio (ref <=5.0)
VLDL: 13 mg/dL (ref <30)

## 2016-05-28 MED ORDER — TRIAMCINOLONE ACETONIDE 0.1 % EX CREA: 1.0000 "application " | TOPICAL_CREAM | Freq: Two times a day (BID) | CUTANEOUS | Status: DC

## 2016-05-28 MED ORDER — CALCIUM CARBONATE-VITAMIN D 500-200 MG-UNIT PO TABS
1.0000 | ORAL_TABLET | Freq: Two times a day (BID) | ORAL | Status: DC
Start: 2016-05-28 — End: 2017-02-27

## 2016-05-28 NOTE — Progress Notes (Signed)
Name: Denise Perez   MRN: [id number]    DOB: Jan 22, 1951   Date:05/28/2016       Progress Note  Subjective  Chief Complaint  Chief Complaint  Patient presents with  . Follow-up    Health Screening from Work on 01/11/16-what foods to eat to help bring down Cholesterol     HPI  Osteoporosis: she was diagnosed in 2015 she states she read about the therapy and refuses to have any type of medication. She has been exercising, and increase calcium in her diet, she does not want to repeat studies but is willing to have vitamin D checked. History of fractures - multiple toe fracture, ankle fracture, wrist, knee and left arm  Dyslipidemia: done at a health screening in January, reviewed labs with her and we will recheck labs. She denies chest pain or decrease in exercise tolerance.   OA: both 5th fingers, not taking medication, pain is aching and intermittent. No effusion or erythema   Patient Active Problem List   Diagnosis Date Noted  . Dyslipidemia 05/28/2016  . Osteoporosis 05/28/2016  . Cervical disc disease 05/28/2016  . Primary osteoarthritis involving multiple joints 06/11/2015  . Synovial cyst of wrist 06/11/2015    Past Surgical History  Procedure Laterality Date  . Gum surgery    . Colonoscopy with propofol N/A 08/13/2015    Procedure: COLONOSCOPY WITH PROPOFOL;  Surgeon: Hulen Luster, MD;  Location: [facility] ENDOSCOPY;  Service: Gastroenterology;  Laterality: N/A;    Family History  Problem Relation Age of Onset  . Cancer Father     Colon    Social History   Social History  . Marital Status: Married    Spouse Name: N/A  . Number of Children: N/A  . Years of Education: N/A   Occupational History  . Not on file.   Social History Main Topics  . Smoking status: Former Smoker -- 15 years    Types: Cigarettes    Start date: 11/17/1970    Quit date: 11/17/1985  . Smokeless tobacco: Never Used  . Alcohol Use: No  . Drug Use: No  . Sexual Activity:    Partners: Male    Other Topics Concern  . Not on file   Social History Narrative     Current outpatient prescriptions:  .  calcium-vitamin D (OSCAL WITH D) 500-200 MG-UNIT tablet, Take 1 tablet by mouth 2 (two) times daily., Disp: 60 tablet, Rfl: 0  No Known Allergies   ROS  Constitutional: Negative for fever or weight change.  Respiratory: positive  for a productive cough in am's ( used to smoke but quit 30 years ago ) no shortness of breath.   Cardiovascular: Negative for chest pain or palpitations.  Gastrointestinal: Negative for abdominal pain, no bowel changes.  Musculoskeletal: Negative for gait problem or joint swelling.  Skin: positive  for rash - right hand, slightly itchy for months.  Neurological: Negative for dizziness or headache.  No other specific complaints in a complete review of systems (except as listed in HPI above).  Objective  Filed Vitals:   05/28/16 0832  BP: 108/76  Pulse: 69  Temp: 97.5 F (36.4 C)  TempSrc: Oral  Resp: 16  Height: 5\' 6"  (1.676 m)  Weight: 147 lb 1.6 oz (66.724 kg)  SpO2: 96%    Body mass index is 23.75 kg/(m^2).  Physical Exam  Constitutional: Patient appears well-developed and well-nourished. No distress.  HEENT: head atraumatic, normocephalic, pupils equal and reactive to light,neck supple,  throat within normal limits Cardiovascular: Normal rate, regular rhythm and normal heart sounds.  No murmur heard. No BLE edema. Pulmonary/Chest: Effort normal and breath sounds normal. No respiratory distress. Abdominal: Soft.  There is no tenderness. Psychiatric: Patient has a normal mood and affect. behavior is normal. Judgment and thought content normal. Skin: dry red, scaly rash on right palmar hand  PHQ2/9: Depression screen [facility] 2/9 05/28/2016 10/15/2015 06/11/2015 06/11/2015  Decreased Interest 0 0 0 0  Down, Depressed, Hopeless 0 0 0 0  PHQ - 2 Score 0 0 0 0     Fall Risk: Fall Risk  05/28/2016 10/15/2015 06/11/2015 06/11/2015  Falls in  the past year? No No Yes No  Number falls in past yr: - - 1 -  Injury with Fall? - - No -     Functional Status Survey: Is the patient deaf or have difficulty hearing?: No Does the patient have difficulty seeing, even when wearing glasses/contacts?: No Does the patient have difficulty concentrating, remembering, or making decisions?: No Does the patient have difficulty walking or climbing stairs?: No Does the patient have difficulty dressing or bathing?: No Does the patient have difficulty doing errands alone such as visiting a doctor's office or shopping?: No    Assessment & Plan  1. Primary osteoarthritis involving multiple joints  Doing well at this time  2. Dyslipidemia  - Lipid panel  3. Osteoporosis  - calcium-vitamin D (OSCAL WITH D) 500-200 MG-UNIT tablet; Take 1 tablet by mouth 2 (two) times daily.  Dispense: 60 tablet; Refill: 0 - Vitamin D (25 hydroxy) - TSH - Comp panel   4. Need for hepatitis C screening test  - Hepatitis C antibody  5. Need for pneumococcal vaccination  - Pneumococcal polysaccharide vaccine 23-valent greater than or equal to 2yo subcutaneous/IM   6. Rash  Right hand, we will try topical steroid , return if no resolution  - triamcinolone cream (KENALOG) 0.1 %; Apply 1 application topically 2 (two) times daily.  Dispense: 30 g; Refill: 0

## 2016-05-29 LAB — VITAMIN D 25 HYDROXY (VIT D DEFICIENCY, FRACTURES): Vit D, 25-Hydroxy: 28 ng/mL — ABNORMAL LOW (ref 30–100)

## 2016-05-29 LAB — HEPATITIS C ANTIBODY: HCV AB: NEGATIVE

## 2016-05-29 LAB — TSH: TSH: 0.69 mIU/L

## 2016-05-30 LAB — COMPREHENSIVE METABOLIC PANEL
ALK PHOS: 91 U/L (ref 33–130)
ALT: 18 U/L (ref 6–29)
AST: 24 U/L (ref 10–35)
Albumin: 4 g/dL (ref 3.6–5.1)
BILIRUBIN TOTAL: 0.4 mg/dL (ref 0.2–1.2)
BUN: 11 mg/dL (ref 7–25)
CHLORIDE: 105 mmol/L (ref 98–110)
CO2: 24 mmol/L (ref 20–31)
CREATININE: 0.92 mg/dL (ref 0.50–0.99)
Calcium: 8.9 mg/dL (ref 8.6–10.4)
GLUCOSE: 83 mg/dL (ref 65–99)
POTASSIUM: 4.7 mmol/L (ref 3.5–5.3)
SODIUM: 143 mmol/L (ref 135–146)
Total Protein: 6.1 g/dL (ref 6.1–8.1)

## 2016-11-27 DIAGNOSIS — H04123 Dry eye syndrome of bilateral lacrimal glands: Secondary | ICD-10-CM | POA: Diagnosis not present

## 2016-11-27 DIAGNOSIS — H43813 Vitreous degeneration, bilateral: Secondary | ICD-10-CM | POA: Diagnosis not present

## 2017-02-27 ENCOUNTER — Ambulatory Visit: Payer: PPO | Admitting: Family Medicine

## 2017-02-27 ENCOUNTER — Encounter: Payer: Self-pay | Admitting: Family Medicine

## 2017-02-27 ENCOUNTER — Ambulatory Visit (INDEPENDENT_AMBULATORY_CARE_PROVIDER_SITE_OTHER): Payer: PPO | Admitting: Family Medicine

## 2017-02-27 VITALS — BP 120/70 | HR 87 | Temp 98.6°F | Resp 16 | Ht 66.0 in | Wt 147.1 lb

## 2017-02-27 DIAGNOSIS — Z0000 Encounter for general adult medical examination without abnormal findings: Secondary | ICD-10-CM

## 2017-02-27 MED ORDER — CALCIUM CARBONATE-VITAMIN D 500-200 MG-UNIT PO TABS: 1.0000 | ORAL_TABLET | Freq: Two times a day (BID) | ORAL | 0 refills | Status: DC

## 2017-02-27 NOTE — Patient Instructions (Addendum)
Preventive Care 65 Years and Older, Female Preventive care refers to lifestyle choices and visits with your health care provider that can promote health and wellness. What does preventive care include?  A yearly physical exam. This is also called an annual well check.  Dental exams once or twice a year.  Routine eye exams. Ask your health care provider how often you should have your eyes checked.  Personal lifestyle choices, including:  Daily care of your teeth and gums.  Regular physical activity.  Eating a healthy diet.  Avoiding tobacco and drug use.  Limiting alcohol use.  Practicing safe sex.  Taking low-dose aspirin every day.  Taking vitamin and mineral supplements as recommended by your health care provider. What happens during an annual well check? The services and screenings done by your health care provider during your annual well check will depend on your age, overall health, lifestyle risk factors, and family history of disease. Counseling  Your health care provider may ask you questions about your:  Alcohol use.  Tobacco use.  Drug use.  Emotional well-being.  Home and relationship well-being.  Sexual activity.  Eating habits.  History of falls.  Memory and ability to understand (cognition).  Work and work environment.  Reproductive health. Screening  You may have the following tests or measurements:  Height, weight, and BMI.  Blood pressure.  Lipid and cholesterol levels. These may be checked every 5 years, or more frequently if you are over 50 years old.  Skin check.  Lung cancer screening. You may have this screening every year starting at age 55 if you have a 30-pack-year history of smoking and currently smoke or have quit within the past 15 years.  Fecal occult blood test (FOBT) of the stool. You may have this test every year starting at age 50.  Flexible sigmoidoscopy or colonoscopy. You may have a sigmoidoscopy every 5 years or  a colonoscopy every 10 years starting at age 50.  Hepatitis C blood test.  Hepatitis B blood test.  Sexually transmitted disease (STD) testing.  Diabetes screening. This is done by checking your blood sugar (glucose) after you have not eaten for a while (fasting). You may have this done every 1-3 years.  Bone density scan. This is done to screen for osteoporosis. You may have this done starting at age 67.  Mammogram. This may be done every 1-2 years. Talk to your health care provider about how often you should have regular mammograms. Talk with your health care provider about your test results, treatment options, and if necessary, the need for more tests. Vaccines  Your health care provider may recommend certain vaccines, such as:  Influenza vaccine. This is recommended every year.  Tetanus, diphtheria, and acellular pertussis (Tdap, Td) vaccine. You may need a Td booster every 10 years.  Varicella vaccine. You may need this if you have not been vaccinated.  Zoster vaccine. You may need this after age 60.  Measles, mumps, and rubella (MMR) vaccine. You may need at least one dose of MMR if you were born in 1957 or later. You may also need a second dose.  Pneumococcal 13-valent conjugate (PCV13) vaccine. One dose is recommended after age 67.  Pneumococcal polysaccharide (PPSV23) vaccine. One dose is recommended after age 67.  Meningococcal vaccine. You may need this if you have certain conditions.  Hepatitis A vaccine. You may need this if you have certain conditions or if you travel or work in places where you may be exposed to   hepatitis A.  Hepatitis B vaccine. You may need this if you have certain conditions or if you travel or work in places where you may be exposed to hepatitis B.  Haemophilus influenzae type b (Hib) vaccine. You may need this if you have certain conditions. Talk to your health care provider about which screenings and vaccines you need and how often you need  them. This information is not intended to replace advice given to you by your health care provider. Make sure you discuss any questions you have with your health care provider. Document Released: 11/30/2015 Document Revised: 07/23/2016 Document Reviewed: 09/04/2015 Elsevier Interactive Patient Education  2017 [employer].  Kegel Exercises Kegel exercises help strengthen the muscles that support the rectum, vagina, small intestine, bladder, and uterus. Doing Kegel exercises can help:  Improve bladder and bowel control.  Improve sexual response.  Reduce problems and discomfort during pregnancy. Kegel exercises involve squeezing your pelvic floor muscles, which are the same muscles you squeeze when you try to stop the flow of urine. The exercises can be done while sitting, standing, or lying down, but it is best to vary your position. Phase 1 exercises 1. Squeeze your pelvic floor muscles tight. You should feel a tight lift in your rectal area. If you are a female, you should also feel a tightness in your vaginal area. Keep your stomach, buttocks, and legs relaxed. 2. Hold the muscles tight for up to 10 seconds. 3. Relax your muscles. Repeat this exercise 50 times a day or as many times as told by your health care provider. Continue to do this exercise for at least 4-6 weeks or for as long as told by your health care provider. This information is not intended to replace advice given to you by your health care provider. Make sure you discuss any questions you have with your health care provider. Document Released: 10/20/2012 Document Revised: 06/28/2016 Document Reviewed: 09/23/2015 Elsevier Interactive Patient Education  2017 [employer].

## 2017-02-27 NOTE — Progress Notes (Signed)
Name: Denise Perez   MRN: [id number]    DOB: May 25, 1951   Date:02/27/2017       Progress Note  Subjective  Chief Complaint  Chief Complaint  Patient presents with  . Annual Exam    HPI Patient presents for annual medicare wellness examination. She has been doing very well and has no complaints today.  Functional ability/safety issues: No Issues. Hearing issues: Addressed - none noted by patients Activities of daily living: Discussed - Patient able to perform ADL's -Patient does not have grab bars in bathroom, she and her husband are planning to downsize at some point and will implement grab bars when they move to a new home.  Currently uses second floor bedroom/bathroom, but has a guest room and bath on the first floor if needed. Home safety issues: No Issues; rubberized mat in front of sink only that is non-slip, otherwise no rugs, cords, or other trip/fall hazards.  End Of Life Planning: Offered verbal information regarding advanced directives, healthcare power of attorney. Patient has will, living will, and POA documentation at home that she has established with her husband.  Copy for our records is requested.  Preventative care, Health maintenance, Preventative health measures discussed.  Preventative screenings discussed today: lab work, colonoscopy,  mammogram, DEXA: -Patient's last DEXA scan was 2014, not due again until 2019. She states continues to take calcium-Vitamin D supplement most days, but has declined Rx's to improve bone density in the past and again today. -Pt plans to schedule an appointment with [facility] to have mammogram and Pap performed. - Colonoscopy: had in 2016 and was told to follow up in 5 years (2021) due to father having Colorectal cancer -Lab work: Last performed in 05/2016 and looked excellent. Will repeat in 05/2017.  Low Dose CT Chest recommended if Age 67-80 years, 30 pack-year currently smoking OR have quit w/in 15years. - Patient was  66.5 pack year history; She was a smoker for 15 years, smoking 1.5ppd- quit in 1987; not recommended for low dose CT chest based on guidelines. -She had sonogram of aorta on 01/11/2016 and aorta diameter was <2.5cm/WNL  Lifestyle risk factor issued reviewed: Diet, exercise, weight management, advised patient smoking is not healthy, nutrition/diet. -Patient goes to [employer] MWF and walks 1.5-2.33miles; T/F she goes to water aerobics class at the [facility] -Patient reports eating cereal for breakfast, lunch usually soup and half of a sandwich and apple with peanut butter, dinner usually consists of a meat and vegetables; she does like to eat dark chocolate 2 servings daily.  She eats a wide variety of fresh fruits and vegetables daily. - 22.5 pack year history, quit in 1987. -Does not drink alcohol. -Weight management: Patient would like to focus more on toning her muscles rather than losing weight. She has set a goal to begin using hand weights at home 1-2x's per week.  Preventative health measures discussed (5-10 year plan).  Reviewed and recommended vaccinations: - Pneumovax - 05/28/2016 - Prevnar - Due July 2018. - Annual Influenza - UTD - Zostavax - UTD - Tdap - UTD  Depression screening: Done and negative for depression Fall risk screening: Done and negative Discuss ADLs/IADLs: Done - See above, no issues.  Current medical providers: Dr. Rick Duff added to care team - patient's optometrist. Patient to re-establish care with [facility] and will notify of new provider once re-established.  Other health risk factors identified this visit: No other issues  Cognitive impairment issues: None identified  6CIT Screen 02/27/2017  What Year? 0 points  What month? 0 points  What time? 0 points  Count back from 20 0 points  Months in reverse 0 points  Repeat phrase 0 points  Total Score 0   All above discussed with patient. Appropriate education, counseling and referral will be made  based upon the above.   Patient Active Problem List   Diagnosis Date Noted  . Dyslipidemia 05/28/2016  . Osteoporosis 05/28/2016  . Cervical disc disease 05/28/2016  . Primary osteoarthritis involving multiple joints 06/11/2015  . Synovial cyst of wrist 06/11/2015   Past Surgical History:  Procedure Laterality Date  . COLONOSCOPY WITH PROPOFOL N/A 08/13/2015   Procedure: COLONOSCOPY WITH PROPOFOL;  Surgeon: Hulen Luster, MD;  Location: [facility] ENDOSCOPY;  Service: Gastroenterology;  Laterality: N/A;  . GUM SURGERY     Family History  Problem Relation Age of Onset  . Cancer Father     Colon   Social History   Social History  . Marital status: Married    Spouse name: N/A  . Number of children: N/A  . Years of education: N/A   Occupational History  . Not on file.   Social History Main Topics  . Smoking status: Former Smoker    Years: 15.00    Types: Cigarettes    Start date: 11/17/1970    Quit date: 11/17/1985  . Smokeless tobacco: Never Used  . Alcohol use No  . Drug use: No  . Sexual activity: Yes    Partners: Male   Other Topics Concern  . Not on file   Social History Narrative  . No narrative on file    Current Outpatient Prescriptions:  .  calcium-vitamin D (OSCAL WITH D) 500-200 MG-UNIT tablet, Take 1 tablet by mouth 2 (two) times daily., Disp: 60 tablet, Rfl: 0  No Known Allergies  ROS  Constitutional: Negative for fever or weight change.  Respiratory: Negative shortness of breath; positive for cough in the morning without production of sputum.   Cardiovascular: Negative for chest pain or palpitations.  Gastrointestinal: Negative for abdominal pain, no bowel changes.  Musculoskeletal: Negative for gait problem or joint swelling.  Skin: Negative for rash.  Patient notes she has appointment to see a dermatologist for annual skin screening Apr 06, 2017.  Neurological: Negative for dizziness or headache.  No other specific complaints in a complete review of  systems (except as listed in HPI above).  Objective  Vitals:   02/27/17 0838  BP: 120/70  Pulse: 87  Resp: 16  Temp: 98.6 F (37 C)  TempSrc: Oral  SpO2: 96%  Weight: 147 lb 1.6 oz (66.7 kg)  Height: 5\' 6"  (1.676 m)    Body mass index is 23.74 kg/m.  Physical Exam Constitutional: Patient appears well-developed and well-nourished. No distress.  HENT: Head: Normocephalic and atraumatic. Oropharynx is clear and moist. No oropharyngeal exudate.  Eyes: Conjunctivae are normal. Pupils are equal, round, and reactive to light. Cardiovascular: Normal rate, regular rhythm and normal heart sounds.  No murmur heard. No BLE edema. Pulmonary/Chest: Effort normal and breath sounds normal. No respiratory distress. Abdominal: Soft. Bowel sounds are normal, no distension. There is no tenderness. no masses Musculoskeletal: Normal range of motion, no joint effusions. No gross deformities Neurological: he is alert and oriented to person, place, and time. Coordination, balance, strength, speech and gait are normal.  Skin: Skin is warm and dry. No rash noted. No erythema.  Psychiatric: Patient has a normal mood and affect. behavior is normal. Judgment  and thought content normal.   No results found for this or any previous visit (from the past 2160 hour(s)).  PHQ2/9: Depression screen [facility] 2/9 05/28/2016 10/15/2015 06/11/2015 06/11/2015  Decreased Interest 0 0 0 0  Down, Depressed, Hopeless 0 0 0 0  PHQ - 2 Score 0 0 0 0    Fall Risk: Fall Risk  05/28/2016 10/15/2015 06/11/2015 06/11/2015  Falls in the past year? No No Yes No  Number falls in past yr: - - 1 -  Injury with Fall? - - No -   Functional Status Survey: Is the patient deaf or have difficulty hearing?: No Does the patient have difficulty seeing, even when wearing glasses/contacts?: No (Wears glasses only for driving; does not need readers yet) Does the patient have difficulty concentrating, remembering, or making decisions?: No Does the  patient have difficulty walking or climbing stairs?: No Does the patient have difficulty dressing or bathing?: No Does the patient have difficulty doing errands alone such as visiting a doctor's office or shopping?: No  Assessment & Plan  1. Medicare annual wellness visit, subsequent -Continue with healthy lifestyle; work towards goal of increasing toning exercises to 1-2 times a week. -Schedule appointment to re-establish care with [facility] for annual physical and mammogram. -Preventive guidelines and kegel exercise handouts provided in AVS. -Return in about 1 year (around 02/27/2018) for Annual Medicare Wellness.  I have reviewed this encounter including the documentation in this note and/or discussed this patient with the Johney Maine, FNP, NP-C. I am certifying that I agree with the content of this note as supervising physician.  Steele Sizer, MD [facility] 02/27/2017, 1:35 PM

## 2017-03-11 ENCOUNTER — Ambulatory Visit (INDEPENDENT_AMBULATORY_CARE_PROVIDER_SITE_OTHER): Payer: PPO | Admitting: Advanced Practice Midwife

## 2017-03-11 ENCOUNTER — Encounter: Payer: Self-pay | Admitting: Advanced Practice Midwife

## 2017-03-11 VITALS — BP 120/70 | HR 65 | Ht 66.0 in | Wt 147.0 lb

## 2017-03-11 DIAGNOSIS — Z01419 Encounter for gynecological examination (general) (routine) without abnormal findings: Secondary | ICD-10-CM

## 2017-03-11 NOTE — Progress Notes (Signed)
Patient ID: Denise Perez, female   DOB: [birth year], 66 y.o.   MRN: [id number]     Gynecology Annual Exam  PCP: Loistine Chance, MD  Chief Complaint:  Chief Complaint  Patient presents with  . Gynecologic Exam    History of Present Illness:Patient is a 65 y.o. G1P0010 presents for annual exam. The patient has no complaints today. She denies breast or gyn concerns. She admits to healthy lifestyle diet and exercise. She takes no medication. She takes calcium and vit D supplements and does weight bearing exercise to decrease her chance of breaking a bone due to her osteoporosis. She is current on all age appropriate screenings per her PCP.   LMP: No LMP recorded. Patient is postmenopausal. She has not had any postmenopausal bleeding.     The patient is sexually active. She denies dyspareunia.  The patient does not perform self breast exams.  There is no notable family history of breast or ovarian cancer in her family.  The patient wears seatbelts: yes.   The patient has regular exercise: yes.    The patient denies current symptoms of depression.     Review of Systems: Review of Systems  Constitutional: Negative.   HENT: Positive for sore throat.   Eyes: Negative.   Respiratory: Negative.   Cardiovascular: Negative.   Gastrointestinal: Negative.   Genitourinary: Negative.   Musculoskeletal: Negative.   Skin: Negative.   Neurological: Negative.   Endo/Heme/Allergies: Negative.   Psychiatric/Behavioral: Negative.     Past Medical History:  Past Medical History:  Diagnosis Date  . Cervical disc disease   . Cervical neuropathy   . Osteoporosis   . Tietze's disease     Past Surgical History:  Past Surgical History:  Procedure Laterality Date  . COLONOSCOPY WITH PROPOFOL N/A 08/13/2015   Procedure: COLONOSCOPY WITH PROPOFOL;  Surgeon: Hulen Luster, MD;  Location: [facility] ENDOSCOPY;  Service: Gastroenterology;  Laterality: N/A;  . GUM SURGERY      Gynecologic History:    No LMP recorded. Patient is postmenopausal. Last Pap: Results were: no abnormalities  Last mammogram: January 2017 negative  Obstetric History: G1P0010  Family History:  Family History  Problem Relation Age of Onset  . Cancer Father     Colon    Social History:  Social History   Social History  . Marital status: Married    Spouse name: N/A  . Number of children: N/A  . Years of education: N/A   Occupational History  . Not on file.   Social History Main Topics  . Smoking status: Former Smoker    Years: 15.00    Types: Cigarettes    Start date: 11/17/1970    Quit date: 11/17/1985  . Smokeless tobacco: Never Used  . Alcohol use No  . Drug use: No  . Sexual activity: Yes    Partners: Male   Other Topics Concern  . Not on file   Social History Narrative  . No narrative on file    Allergies:  No Known Allergies  Medications: Prior to Admission medications   Medication Sig Start Date End Date Taking? Authorizing Provider  calcium-vitamin D (OSCAL WITH D) 500-200 MG-UNIT tablet Take 1 tablet by mouth 2 (two) times daily. 02/27/17   Hubbard Hartshorn, FNP    Physical Exam Vitals: Blood pressure 120/70, pulse 65, height 5\' 6"  (1.676 m), weight 147 lb (66.7 kg).  General: NAD HEENT: normocephalic, anicteric Thyroid: no enlargement, no palpable nodules Pulmonary: No  increased work of breathing, CTAB Cardiovascular: RRR, distal pulses 2+ Breast: Breast symmetrical, no tenderness, no palpable nodules or masses, no skin or nipple retraction present, no nipple discharge.  No axillary or supraclavicular lymphadenopathy. Abdomen: NABS, soft, non-tender, non-distended.  Umbilicus without lesions.  No hepatomegaly, splenomegaly or masses palpable. No evidence of hernia  Genitourinary:  External: Normal external female genitalia.  Normal urethral meatus, normal  Bartholin's and Skene's glands.    Vagina: Normal vaginal mucosa, no evidence of prolapse.    Cervix: Grossly normal in  appearance, no bleeding  Uterus: Non-enlarged, mobile, normal contour.  No CMT  Adnexa: ovaries non-enlarged, no adnexal masses  Rectal: deferred  Lymphatic: no evidence of inguinal lymphadenopathy Extremities: no edema, erythema, or tenderness Neurologic: Grossly intact Psychiatric: mood appropriate, affect full  Female chaperone present for pelvic and breast  portions of the physical exam    Assessment: 66 y.o. G1P0010 No problem-specific Assessment & Plan notes found for this encounter.   Plan: Problem List Items Addressed This Visit    None    Visit Diagnoses    Well woman exam with routine gynecological exam    -  Primary      1) Mammogram - recommend yearly screening mammogram.  Mammogram pt will self schedule after today's visit  2) Continue healthy lifestyle diet, exercise, calcium/vit D supplements  3) ASCCP guidelines and rational discussed.  Patient opts for every other year screening interval  4) Osteoporosis  - per USPTF routine screening DEXA at age 79 Pt is due for Bone Density scan next year  5) Routine healthcare maintenance including cholesterol, diabetes screening discussed managed by PCP  6) Colonoscopy next due in 2021  7) Follow up 1 year for routine annual   Rod Can, [place]

## 2017-03-12 ENCOUNTER — Other Ambulatory Visit: Payer: Self-pay | Admitting: Advanced Practice Midwife

## 2017-03-12 DIAGNOSIS — Z1231 Encounter for screening mammogram for malignant neoplasm of breast: Secondary | ICD-10-CM

## 2017-04-03 ENCOUNTER — Ambulatory Visit
Admission: RE | Admit: 2017-04-03 | Discharge: 2017-04-03 | Disposition: A | Discharge status: Home / Self Care | Payer: PPO | Source: Ambulatory Visit | Attending: Advanced Practice Midwife | Admitting: Advanced Practice Midwife

## 2017-04-03 DIAGNOSIS — Z1231 Encounter for screening mammogram for malignant neoplasm of breast: Secondary | ICD-10-CM

## 2017-04-30 DIAGNOSIS — H00024 Hordeolum internum left upper eyelid: Secondary | ICD-10-CM | POA: Diagnosis not present

## 2017-06-03 ENCOUNTER — Encounter: Payer: Self-pay | Admitting: Family Medicine

## 2017-06-03 ENCOUNTER — Ambulatory Visit
Admission: RE | Admit: 2017-06-03 | Discharge: 2017-06-03 | Disposition: A | Discharge status: Home / Self Care | Payer: PPO | Source: Ambulatory Visit | Attending: Family Medicine | Admitting: Family Medicine

## 2017-06-03 ENCOUNTER — Ambulatory Visit (INDEPENDENT_AMBULATORY_CARE_PROVIDER_SITE_OTHER): Payer: PPO | Admitting: Family Medicine

## 2017-06-03 VITALS — BP 120/82 | HR 82 | Temp 98.7°F | Resp 16 | Ht 66.0 in | Wt 146.6 lb

## 2017-06-03 DIAGNOSIS — R05 Cough: Secondary | ICD-10-CM

## 2017-06-03 DIAGNOSIS — R0989 Other specified symptoms and signs involving the circulatory and respiratory systems: Secondary | ICD-10-CM | POA: Diagnosis not present

## 2017-06-03 DIAGNOSIS — K3189 Other diseases of stomach and duodenum: Secondary | ICD-10-CM | POA: Diagnosis not present

## 2017-06-03 DIAGNOSIS — R059 Cough, unspecified: Secondary | ICD-10-CM

## 2017-06-03 DIAGNOSIS — J9801 Acute bronchospasm: Secondary | ICD-10-CM

## 2017-06-03 DIAGNOSIS — J984 Other disorders of lung: Secondary | ICD-10-CM | POA: Insufficient documentation

## 2017-06-03 MED ORDER — ALBUTEROL SULFATE HFA 108 (90 BASE) MCG/ACT IN AERS: 2.0000 | INHALATION_SPRAY | Freq: Four times a day (QID) | RESPIRATORY_TRACT | 2 refills | Status: DC | PRN

## 2017-06-03 NOTE — Patient Instructions (Addendum)
Bronchospasm, Adult Bronchospasm is when airways in the lungs get smaller. When this happens, it can be hard to breathe. You may cough. You may also make a whistling sound when you breathe (wheeze). Follow these instructions at home: Medicines  Take over-the-counter and prescription medicines only as told by your doctor.  If you need to use an inhaler or nebulizer to take your medicine, ask your doctor how to use it.  If you were given a spacer, always use it with your inhaler. Lifestyle  Change your heating and air conditioning filter. Do this at least once a month.  Try not to use fireplaces and wood stoves.  Do not  smoke. Do not  allow smoking in your home.  Try not to use things that have a strong smell, like perfume.  Get rid of pests (such as roaches and mice) and their poop.  Remove any mold from your home.  Keep your house clean. Get rid of dust.  Use cleaning products that have no smell.  Replace carpet with wood, tile, or vinyl flooring.  Use allergy-proof pillows, mattress covers, and box spring covers.  Wash bed sheets and blankets every week. Use hot water. Dry them in a dryer.  Use blankets that are made of polyester or cotton.  Wash your hands often.  Keep pets out of your bedroom.  When you exercise, try not to breathe in cold air. General instructions  Have a plan for getting medical care. Know these things: ? When to call your doctor. ? When to call local emergency services (911 in the [place] ? Where to go in an emergency.  Stay up to date on your shots (immunizations).  When you have an episode: ? Stay calm. ? Relax. ? Breathe slowly. Contact a doctor if:  Your muscles ache.  Your chest hurts.  The color of the mucus you cough up (sputum) changes from clear or white to yellow, green, gray, or bloody.  The mucus you cough up gets thicker.  You have a fever. Get help right away if:  The whistling sound gets worse, even after you  take your medicines.  Your coughing gets worse.  You find it even harder to breathe.  Your chest hurts very much. Summary  Bronchospasm is when airways in the lungs get smaller.  When this happens, it can be hard to breathe. You may cough. You may also make a whistling sound when you breathe.  Stay away from things that cause you to have episodes. These include smoke or dust. This information is not intended to replace advice given to you by your health care provider. Make sure you discuss any questions you have with your health care provider. Document Released: 08/31/2009 Document Revised: 11/06/2016 Document Reviewed: 11/06/2016 Elsevier Interactive Patient Education  2017 [place] A cool mist vaporizer is a device that releases a cool mist into the air. If you have a cough or a cold, using a vaporizer may help relieve your symptoms. The mist adds moisture to the air, which may help thin your mucus and make it less sticky. When your mucus is thin and less sticky, it easier for you to breathe and to cough up secretions. Do not use a vaporizer if you are allergic to mold. Follow these instructions at home:  Follow the instructions that come with the vaporizer.  Do not use anything other than distilled water in the vaporizer.  Do not run the vaporizer all of the time. Doing  that can cause mold or bacteria to grow in the vaporizer.  Clean the vaporizer after each time that you use it.  Clean and dry the vaporizer well before storing it.  Stop using the vaporizer if your breathing symptoms get worse. This information is not intended to replace advice given to you by your health care provider. Make sure you discuss any questions you have with your health care provider. Document Released: 07/31/2004 Document Revised: 05/23/2016 Document Reviewed: 02/02/2016 Elsevier Interactive Patient Education  [employer].

## 2017-06-03 NOTE — Progress Notes (Addendum)
Name: Denise Perez   MRN: [id number]    DOB: Aug 12, 1951   Date:06/03/2017       Progress Note  Subjective  Chief Complaint  Chief Complaint  Patient presents with  . Cough    congested for 1 week    HPI  Pt presents with c/o cough and chest congestion - has not been sleeping, is having muscle soreness in upper abdomen from coughing so much.  Has been taking cough medication the last 3 days and this helps her to sleep, but she is still having significant bronchospasm and coughing throughout the day. No NVD, fevers or chills, nasal congestion, ear pain/pressure, or sore throat.  Pt unable to take prednisone due to previous poor reaction.  Patient Active Problem List   Diagnosis Date Noted  . Dyslipidemia 05/28/2016  . Osteoporosis 05/28/2016  . Cervical disc disease 05/28/2016  . Primary osteoarthritis involving multiple joints 06/11/2015  . Synovial cyst of wrist 06/11/2015    Social History  Substance Use Topics  . Smoking status: Former Smoker    Years: 15.00    Types: Cigarettes    Start date: 11/17/1970    Quit date: 11/17/1985  . Smokeless tobacco: Never Used  . Alcohol use No     Current Outpatient Prescriptions:  .  calcium-vitamin D (OSCAL WITH D) 500-200 MG-UNIT tablet, Take 1 tablet by mouth 2 (two) times daily., Disp: 60 tablet, Rfl: 0  No Known Allergies  ROS  Ten systems reviewed and is negative except as mentioned in HPI  Objective  Vitals:   06/03/17 1115  BP: 120/82  Pulse: 82  Resp: 16  Temp: 98.7 F (37.1 C)  TempSrc: Oral  SpO2: 95%  Weight: 146 lb 9.6 oz (66.5 kg)  Height: 5\' 6"  (1.676 m)   Body mass index is 23.66 kg/m.  Nursing Note and Vital Signs reviewed.  Physical Exam  Constitutional: Patient appears well-developed and well-nourished. Obese No distress.  HEENT: head atraumatic, normocephalic, pupils equal and reactive to light, EOM's intact, TM's without erythema or bulging, no maxillary or frontal sinus pain on  palpation, neck supple without lymphadenopathy, oropharynx pink and moist without exudate Cardiovascular: Normal rate, regular rhythm, S1/S2 present.  No murmur or rub heard. No BLE edema. Pulmonary/Chest: Effort normal and breath sounds clear - no wheezing, rhonchi, or crackles. No respiratory distress or retractions. Psychiatric: Patient has a normal mood and affect. behavior is normal. Judgment and thought content normal.  No results found for this or any previous visit (from the past 2160 hour(s)).   Assessment & Plan  1. Bronchospasm - albuterol (PROVENTIL HFA;VENTOLIN HFA) 108 (90 Base) MCG/ACT inhaler; Inhale 2 puffs into the lungs every 6 (six) hours as needed for wheezing or shortness of breath.  Dispense: 1 Inhaler; Refill: 2  2. Cough - albuterol (PROVENTIL HFA;VENTOLIN HFA) 108 (90 Base) MCG/ACT inhaler; Inhale 2 puffs into the lungs every 6 (six) hours as needed for wheezing or shortness of breath.  Dispense: 1 Inhaler; Refill: 2 - DG Chest 2 View; Future - Declines prednisone due to previous reaction to medication.  Declines Tessalon or Tussionex, wants to continue her cough syrup she has been using at home.  3. Chest congestion - DG Chest 2 View; Future - Negative for Pneumonia  Return if symptoms worsen or fail to improve, for 5-7 days if not improving. -Red flags and when to present for emergency care or RTC including fever >101.25F, chest pain, shortness of breath unrelieved by inhaler, new/worsening/un-resolving  symptoms, reviewed with patient at time of visit. Follow up and care instructions discussed and provided in AVS.  I have reviewed this encounter including the documentation in this note and/or discussed this patient with the Johney Maine, FNP, NP-C. I am certifying that I agree with the content of this note as supervising physician.  Steele Sizer, MD [facility] 06/04/2017, 1:04 PM

## 2017-06-03 NOTE — Progress Notes (Signed)
Negative Chest Xray - please advise to continue course that we discussed and return if not feeling better. Thank you!

## 2017-11-25 ENCOUNTER — Ambulatory Visit: Payer: PPO | Admitting: Family Medicine

## 2017-11-25 ENCOUNTER — Encounter: Payer: Self-pay | Admitting: Family Medicine

## 2017-11-25 VITALS — BP 118/68 | HR 85 | Temp 97.9°F | Resp 16 | Ht 66.0 in | Wt 146.7 lb

## 2017-11-25 DIAGNOSIS — J069 Acute upper respiratory infection, unspecified: Secondary | ICD-10-CM | POA: Diagnosis not present

## 2017-11-25 DIAGNOSIS — R059 Cough, unspecified: Secondary | ICD-10-CM

## 2017-11-25 DIAGNOSIS — M79671 Pain in right foot: Secondary | ICD-10-CM

## 2017-11-25 DIAGNOSIS — R05 Cough: Secondary | ICD-10-CM | POA: Diagnosis not present

## 2017-11-25 MED ORDER — HYDROCOD POLST-CPM POLST ER 10-8 MG/5ML PO SUER: 5.0000 mL | Freq: Every evening | ORAL | 0 refills | Status: DC | PRN

## 2017-11-25 MED ORDER — BENZONATATE 100 MG PO CAPS: 100.0000 mg | ORAL_CAPSULE | Freq: Three times a day (TID) | ORAL | 0 refills | Status: DC | PRN

## 2017-11-25 NOTE — Progress Notes (Signed)
Name: Denise Perez   MRN: [id number]    DOB: 09/02/1951   Date:11/25/2017       Progress Note  Subjective  Chief Complaint  Chief Complaint  Patient presents with  . Scratchy Throat    Onset-Last Thursday, unchanged, dry cough, nasal congestion-clear mucus. Has taken a aspirin to help relieve symptoms  . Pain    Onset- 4 weeks ago, Right Foot, Patient will walk on the treadmill for 2 miles and then go home and eat breakfast then after sitting down she will having shooting pain on the top of her right foot that will go away once she start walking again.      HPI  URI: she is very active and has a busy life, she is very concerned about getting sicker. She states symptoms started 6 days ago, initially with nasal congestion, post-nasal drainage, clear rhinorrhea, and scratchy throat. She also had chills, but no fever. She states sore throat was a little worse a couple of days ago, but worse symptom now is a cough that has been affecting her sleep. She denies wheezing or SOB, able to go to the gym this morning.   Right foot pain: she states she can use the elliptical machine at home or go for a walk without problems, but sometimes when she gets up from the sofa she has a sharp and intense pain on dorsal aspect of right foot that shoots towards her ankle, symptoms can last seconds to up to 30 minutes, no redness or swelling.    Patient Active Problem List   Diagnosis Date Noted  . Dyslipidemia 05/28/2016  . Osteoporosis 05/28/2016  . Cervical disc disease 05/28/2016  . Primary osteoarthritis involving multiple joints 06/11/2015  . Synovial cyst of wrist 06/11/2015    Past Surgical History:  Procedure Laterality Date  . COLONOSCOPY WITH PROPOFOL N/A 08/13/2015   Procedure: COLONOSCOPY WITH PROPOFOL;  Surgeon: Hulen Luster, MD;  Location[facility] ENDOSCOPY;  Service: Gastroenterology;  Laterality: N/A;  . GUM SURGERY      Family History  Problem Relation Age of Onset  . Cancer Father      Colon    Social History   Socioeconomic History  . Marital status: Married    Spouse name: Not on file  . Number of children: Not on file  . Years of education: Not on file  . Highest education level: Not on file  Social Needs  . Financial resource strain: Not on file  . Food insecurity - worry: Not on file  . Food insecurity - inability: Not on file  . Transportation needs - medical: Not on file  . Transportation needs - non-medical: Not on file  Occupational History  . Not on file  Tobacco Use  . Smoking status: Former Smoker    Years: 15.00    Types: Cigarettes    Start date: 11/17/1970    Last attempt to quit: 11/17/1985    Years since quitting: 32.0  . Smokeless tobacco: Never Used  Substance and Sexual Activity  . Alcohol use: No    Alcohol/week: 0.0 oz  . Drug use: No  . Sexual activity: Yes    Partners: Male  Other Topics Concern  . Not on file  Social History Narrative  . Not on file     Current Outpatient Medications:  .  calcium-vitamin D (OSCAL WITH D) 500-200 MG-UNIT tablet, Take 1 tablet by mouth 2 (two) times daily., Disp: 60 tablet, Rfl: 0 .  benzonatate (TESSALON) 100 MG capsule, Take 1-2 capsules (100-200 mg total) by mouth 3 (three) times daily as needed., Disp: 40 capsule, Rfl: 0 .  chlorpheniramine-HYDROcodone (TUSSIONEX PENNKINETIC ER) 10-8 MG/5ML SUER, Take 5 mLs by mouth at bedtime as needed., Disp: 140 mL, Rfl: 0  Allergies  Allergen Reactions  . Prednisone Other (See Comments)    Shaking     ROS  Constitutional: Negative for fever or weight change.  Respiratory: Negative for cough and shortness of breath.   Cardiovascular: Negative for chest pain or palpitations.  Gastrointestinal: Negative for abdominal pain, no bowel changes.  Musculoskeletal: Negative for gait problem or joint swelling.  Skin: Negative for rash.  Neurological: Negative for dizziness or headache.  No other specific complaints in a complete review of systems  (except as listed in HPI above).  Objective  Vitals:   11/25/17 1426  BP: 118/68  Pulse: 85  Resp: 16  Temp: 97.9 F (36.6 C)  TempSrc: Oral  SpO2: 96%  Weight: 146 lb 11.2 oz (66.5 kg)  Height: 5\' 6"  (1.676 m)    Body mass index is 23.68 kg/m.  Physical Exam  Constitutional: Patient appears well-developed and well-nourished.  No distress.  HEENT: head atraumatic, normocephalic, pupils equal and reactive to light, ears normal , neck supple, throat within normal limits Cardiovascular: Normal rate, regular rhythm and normal heart sounds.  No murmur heard. No BLE edema. Pulmonary/Chest: Effort normal and breath sounds normal. No respiratory distress. Abdominal: Soft.  There is no tenderness. Psychiatric: Patient has a normal mood and affect. behavior is normal. Judgment and thought content normal. Muscular skeletal: normal foot exam   PHQ2/9: Depression screen [facility] 2/9 11/25/2017 05/28/2016 10/15/2015 06/11/2015 06/11/2015  Decreased Interest 0 0 0 0 0  Down, Depressed, Hopeless 0 0 0 0 0  PHQ - 2 Score 0 0 0 0 0     Fall Risk: Fall Risk  11/25/2017 05/28/2016 10/15/2015 06/11/2015 06/11/2015  Falls in the past year? No No No Yes No  Number falls in past yr: - - - 1 -  Injury with Fall? - - - No -     Functional Status Survey: Is the patient deaf or have difficulty hearing?: No Does the patient have difficulty seeing, even when wearing glasses/contacts?: No Does the patient have difficulty concentrating, remembering, or making decisions?: No Does the patient have difficulty walking or climbing stairs?: No Does the patient have difficulty dressing or bathing?: No Does the patient have difficulty doing errands alone such as visiting a doctor's office or shopping?: No    Assessment & Plan  1. Acute URI  - benzonatate (TESSALON) 100 MG capsule; Take 1-2 capsules (100-200 mg total) by mouth 3 (three) times daily as needed.  Dispense: 40 capsule; Refill: 0 -  chlorpheniramine-HYDROcodone (TUSSIONEX PENNKINETIC ER) 10-8 MG/5ML SUER; Take 5 mLs by mouth at bedtime as needed.  Dispense: 140 mL; Refill: 0  2. Cough  - benzonatate (TESSALON) 100 MG capsule; Take 1-2 capsules (100-200 mg total) by mouth 3 (three) times daily as needed.  Dispense: 40 capsule; Refill: 0 - chlorpheniramine-HYDROcodone (TUSSIONEX PENNKINETIC ER) 10-8 MG/5ML SUER; Take 5 mLs by mouth at bedtime as needed.  Dispense: 140 mL; Refill: 0  3. Acute foot pain, right  - Ambulatory referral to Podiatry ( she would like to see Percell Miller and Noemi Chapel if possible - she saw them back in 2014)

## 2017-11-27 DIAGNOSIS — H5213 Myopia, bilateral: Secondary | ICD-10-CM | POA: Diagnosis not present

## 2017-11-27 DIAGNOSIS — H2513 Age-related nuclear cataract, bilateral: Secondary | ICD-10-CM | POA: Diagnosis not present

## 2017-11-27 DIAGNOSIS — H524 Presbyopia: Secondary | ICD-10-CM | POA: Diagnosis not present

## 2017-12-02 DIAGNOSIS — M19071 Primary osteoarthritis, right ankle and foot: Secondary | ICD-10-CM | POA: Diagnosis not present

## 2017-12-09 ENCOUNTER — Encounter: Payer: Self-pay | Admitting: Family Medicine

## 2017-12-14 ENCOUNTER — Ambulatory Visit (INDEPENDENT_AMBULATORY_CARE_PROVIDER_SITE_OTHER): Payer: PPO

## 2017-12-14 ENCOUNTER — Encounter: Payer: Self-pay | Admitting: Podiatry

## 2017-12-14 ENCOUNTER — Ambulatory Visit: Payer: PPO | Admitting: Podiatry

## 2017-12-14 VITALS — BP 112/73 | HR 66 | Resp 16

## 2017-12-14 DIAGNOSIS — M19071 Primary osteoarthritis, right ankle and foot: Secondary | ICD-10-CM | POA: Diagnosis not present

## 2017-12-14 DIAGNOSIS — M778 Other enthesopathies, not elsewhere classified: Secondary | ICD-10-CM

## 2017-12-14 DIAGNOSIS — M7751 Other enthesopathy of right foot: Secondary | ICD-10-CM | POA: Diagnosis not present

## 2017-12-14 DIAGNOSIS — M779 Enthesopathy, unspecified: Principal | ICD-10-CM

## 2017-12-14 NOTE — Progress Notes (Signed)
  Subjective:  Patient ID: Denise Perez, female    DOB: [birth year],  MRN: [id number] HPI Chief Complaint  Patient presents with  . Foot Pain    Dorsal midfoot and anterior ankle right - aching, some sharp pains x 6 months, worsened around Christmas, active at gym including water aerobics, no treatment, referred by Dr. Noemi Chapel stating she had arthritis    67 y.o. female presents with the above complaint.     Past Medical History:  Diagnosis Date  . Cervical disc disease   . Cervical neuropathy   . Osteoporosis   . Tietze's disease    Past Surgical History:  Procedure Laterality Date  . COLONOSCOPY WITH PROPOFOL N/A 08/13/2015   Procedure: COLONOSCOPY WITH PROPOFOL;  Surgeon: Hulen Luster, MD;  Location: [facility] ENDOSCOPY;  Service: Gastroenterology;  Laterality: N/A;  . GUM SURGERY      Current Outpatient Medications:  .  calcium-vitamin D (OSCAL WITH D) 500-200 MG-UNIT tablet, Take 1 tablet by mouth 2 (two) times daily., Disp: 60 tablet, Rfl: 0  Allergies  Allergen Reactions  . Prednisone Other (See Comments)    Shaking   Review of Systems  Musculoskeletal: Positive for arthralgias.  All other systems reviewed and are negative.  Objective:   Vitals:   12/14/17 1004  BP: 112/73  Pulse: 66  Resp: 16    General: Well developed, nourished, in no acute distress, alert and oriented x3   Dermatological: Skin is warm, dry and supple bilateral. Nails x 10 are well maintained; remaining integument appears unremarkable at this time. There are no open sores, no preulcerative lesions, no rash or signs of infection present.  Vascular: Dorsalis Pedis artery and Posterior Tibial artery pedal pulses are 2/4 bilateral with immedate capillary fill time. Pedal hair growth present. No varicosities and no lower extremity edema present bilateral.   Neruologic: Grossly intact via light touch bilateral. Vibratory intact via tuning fork bilateral. Protective threshold with Semmes Wienstein  monofilament intact to all pedal sites bilateral. Patellar and Achilles deep tendon reflexes 2+ bilateral. No Babinski or clonus noted bilateral.   Musculoskeletal: No gross boney pedal deformities bilateral. No pain, crepitus, or limitation noted with foot and ankle range of motion bilateral. Muscular strength 5/5 in all groups tested bilateral.  No reproducible findings today on physical exam.  Gait: Unassisted, Nonantalgic.    Radiographs:  3 views of the right foot and ankle taken today demonstrating no major osseous abnormalities.  No acute findings are noted.  She does have some chronic changes consistent with osteoarthritis of the midfoot particularly around the second metatarsal intermediate cuneiform area.  Assessment & Plan:   Assessment: Probable capsulitis and osteoarthritis of the right foot.  Plan: We discussed appropriate shoe gear and other options such as anti-inflammatories.  She will follow-up with me if this becomes more consistent.     Vermelle Cammarata T. [place], [place]

## 2018-01-11 ENCOUNTER — Encounter: Payer: Self-pay | Admitting: Family Medicine

## 2018-01-11 ENCOUNTER — Ambulatory Visit (INDEPENDENT_AMBULATORY_CARE_PROVIDER_SITE_OTHER): Payer: PPO | Admitting: Family Medicine

## 2018-01-11 VITALS — BP 108/70 | HR 98 | Temp 99.1°F | Resp 16 | Ht 66.0 in | Wt 143.4 lb

## 2018-01-11 DIAGNOSIS — J309 Allergic rhinitis, unspecified: Secondary | ICD-10-CM

## 2018-01-11 DIAGNOSIS — J069 Acute upper respiratory infection, unspecified: Secondary | ICD-10-CM | POA: Diagnosis not present

## 2018-01-11 MED ORDER — LORATADINE 10 MG PO TABS: 10.0000 mg | ORAL_TABLET | Freq: Every day | ORAL | 0 refills | Status: DC

## 2018-01-11 NOTE — Progress Notes (Signed)
Name: Denise Perez   MRN: [id number]    DOB: 1951/04/01   Date:01/11/2018       Progress Note  Subjective  Chief Complaint  Chief Complaint  Patient presents with  . Sinusitis    sinus burning, sputum light green, sratchy throat and drainage at night for 5 days    HPI  Pt presents with concern for sinus issue for 5 days of nasal congestion, sinus burning, green nasal discharge, some scratchy throat.  This mornign she noted some blood-tinged nasal drainage.  Denies fevers or chills at home, NVD, chest pain, or shortness of breath, or fatigue.   She is concerned because she has had multiple upper respiratory issues over the last 6-7 months, and she is typically healthy without having these symptoms.  We discussed referrals to allergy vs ENT - she would like to wait until her April 2019 wellness examination to decide if she wants this referral or not.  Patient Active Problem List   Diagnosis Date Noted  . Dyslipidemia 05/28/2016  . Osteoporosis 05/28/2016  . Cervical disc disease 05/28/2016  . Primary osteoarthritis involving multiple joints 06/11/2015  . Synovial cyst of wrist 06/11/2015    Social History   Tobacco Use  . Smoking status: Former Smoker    Years: 15.00    Types: Cigarettes    Start date: 11/17/1970    Last attempt to quit: 11/17/1985    Years since quitting: 32.1  . Smokeless tobacco: Never Used  Substance Use Topics  . Alcohol use: No    Alcohol/week: 0.0 oz     Current Outpatient Medications:  .  calcium-vitamin D (OSCAL WITH D) 500-200 MG-UNIT tablet, Take 1 tablet by mouth 2 (two) times daily., Disp: 60 tablet, Rfl: 0  Allergies  Allergen Reactions  . Prednisone Other (See Comments)    Shaking    ROS  Constitutional: Negative for fever or weight change.  Respiratory: Negative for shortness of breath.   Cardiovascular: Negative for chest pain or palpitations.  Gastrointestinal: Negative for abdominal pain, no bowel changes.  Musculoskeletal:  Negative for gait problem or joint swelling.  Skin: Negative for rash.  Neurological: Negative for dizziness or headache.  No other specific complaints in a complete review of systems (except as listed in HPI above).  Objective  Vitals:   01/11/18 1431  BP: 108/70  Pulse: 98  Resp: 16  Temp: 99.1 F (37.3 C)  TempSrc: Oral  SpO2: 92%  Weight: 143 lb 6.4 oz (65 kg)  Height: 5\' 6"  (1.676 m)   Body mass index is 23.15 kg/m.  Nursing Note and Vital Signs reviewed.  Physical Exam  Constitutional: Patient appears well-developed and well-nourished. Obese. No distress.  HEENT: head atraumatic, normocephalic, pupils equal and reactive to light, EOM's intact, TM's without erythema or bulging, no maxillary or frontal sinus tenderness, neck supple without lymphadenopathy, oropharynx pink and moist without exudate - cobblestoning present.  Nares appear mildly friable. Cardiovascular: Normal rate, regular rhythm, S1/S2 present.  No murmur or rub heard. No BLE edema. Pulmonary/Chest: Effort normal and breath sounds clear. No respiratory distress or retractions. Psychiatric: Patient has a normal mood and affect. behavior is normal. Judgment and thought content normal.  No results found for this or any previous visit (from the past 72 hour(s)).  Assessment & Plan  1. Upper respiratory tract infection, unspecified type - Drink plenty of fluids, sleep with humidifier, place vaseline in nostrils before bed to help with any dryness. - She declines flonase/nasal  spray - loratadine (CLARITIN) 10 MG tablet; Take 1 tablet (10 mg total) by mouth daily.  Dispense: 30 tablet; Refill: 0  2. Allergic rhinitis, unspecified seasonality, unspecified trigger - She declines flonase/nasal spray - loratadine (CLARITIN) 10 MG tablet; Take 1 tablet (10 mg total) by mouth daily.  Dispense: 30 tablet; Refill: 0  -Red flags and when to present for emergency care or RTC including fever >101.92F, chest pain,  shortness of breath, new/worsening/un-resolving symptoms, reviewed with patient at time of visit. Follow up and care instructions discussed and provided in AVS.

## 2018-01-11 NOTE — Patient Instructions (Addendum)
Vaseline in nostrils at night. Advil as needed for fevers or pain.  Cool Mist Vaporizer A cool mist vaporizer is a device that releases a cool mist into the air. If you have a cough or a cold, using a vaporizer may help relieve your symptoms. The mist adds moisture to the air, which may help thin your mucus and make it less sticky. When your mucus is thin and less sticky, it easier for you to breathe and to cough up secretions. Do not use a vaporizer if you are allergic to mold. Follow these instructions at home:  Follow the instructions that come with the vaporizer.  Do not use anything other than distilled water in the vaporizer.  Do not run the vaporizer all of the time. Doing that can cause mold or bacteria to grow in the vaporizer.  Clean the vaporizer after each time that you use it.  Clean and dry the vaporizer well before storing it.  Stop using the vaporizer if your breathing symptoms get worse. This information is not intended to replace advice given to you by your health care provider. Make sure you discuss any questions you have with your health care provider. Document Released: 07/31/2004 Document Revised: 05/23/2016 Document Reviewed: 02/02/2016 Elsevier Interactive Patient Education  2018 [employer].   Upper Respiratory Infection, Adult Most upper respiratory infections (URIs) are caused by a virus. A URI affects the nose, throat, and upper air passages. The most common type of URI is often called "the common cold." Follow these instructions at home:  Take medicines only as told by your doctor.  Gargle warm saltwater or take cough drops to comfort your throat as told by your doctor.  Use a warm mist humidifier or inhale steam from a shower to increase air moisture. This may make it easier to breathe.  Drink enough fluid to keep your pee (urine) clear or pale yellow.  Eat soups and other clear broths.  Have a healthy diet.  Rest as needed.  Go back to work  when your fever is gone or your doctor says it is okay. ? You may need to stay home longer to avoid giving your URI to others. ? You can also wear a face mask and wash your hands often to prevent spread of the virus.  Use your inhaler more if you have asthma.  Do not use any tobacco products, including cigarettes, chewing tobacco, or electronic cigarettes. If you need help quitting, ask your doctor. Contact a doctor if:  You are getting worse, not better.  Your symptoms are not helped by medicine.  You have chills.  You are getting more short of breath.  You have brown or red mucus.  You have yellow or brown discharge from your nose.  You have pain in your face, especially when you bend forward.  You have a fever.  You have puffy (swollen) neck glands.  You have pain while swallowing.  You have white areas in the back of your throat. Get help right away if:  You have very bad or constant: ? Headache. ? Ear pain. ? Pain in your forehead, behind your eyes, and over your cheekbones (sinus pain). ? Chest pain.  You have long-lasting (chronic) lung disease and any of the following: ? Wheezing. ? Long-lasting cough. ? Coughing up blood. ? A change in your usual mucus.  You have a stiff neck.  You have changes in your: ? Vision. ? Hearing. ? Thinking. ? Mood. This information is not  intended to replace advice given to you by your health care provider. Make sure you discuss any questions you have with your health care provider. Document Released: 04/21/2008 Document Revised: 07/06/2016 Document Reviewed: 02/08/2014 Elsevier Interactive Patient Education  2018 [employer].

## 2018-02-07 ENCOUNTER — Other Ambulatory Visit: Payer: Self-pay | Admitting: Family Medicine

## 2018-02-07 DIAGNOSIS — J309 Allergic rhinitis, unspecified: Secondary | ICD-10-CM

## 2018-02-07 DIAGNOSIS — J069 Acute upper respiratory infection, unspecified: Secondary | ICD-10-CM

## 2018-03-05 ENCOUNTER — Encounter: Payer: PPO | Admitting: Family Medicine

## 2018-04-22 ENCOUNTER — Ambulatory Visit (INDEPENDENT_AMBULATORY_CARE_PROVIDER_SITE_OTHER): Payer: PPO

## 2018-04-22 VITALS — BP 102/60 | HR 61 | Temp 97.4°F | Resp 12 | Ht 66.0 in | Wt 142.5 lb

## 2018-04-22 DIAGNOSIS — Z23 Encounter for immunization: Secondary | ICD-10-CM | POA: Diagnosis not present

## 2018-04-22 DIAGNOSIS — Z1231 Encounter for screening mammogram for malignant neoplasm of breast: Secondary | ICD-10-CM | POA: Diagnosis not present

## 2018-04-22 DIAGNOSIS — Z0000 Encounter for general adult medical examination without abnormal findings: Secondary | ICD-10-CM

## 2018-04-22 DIAGNOSIS — Z1239 Encounter for other screening for malignant neoplasm of breast: Secondary | ICD-10-CM

## 2018-04-22 NOTE — Progress Notes (Addendum)
Subjective:   Denise Perez is a 67 y.o. female who presents for Medicare Annual (Subsequent) preventive examination.  Review of Systems:  N/A Cardiac Risk Factors include: dyslipidemia;advanced age (>29men, >7 women)     Objective:     Vitals: BP 102/60 (BP Location: Left Arm, Patient Position: Sitting, Cuff Size: Normal)   Pulse 61   Temp (!) 97.4 F (36.3 C) (Oral)   Resp 12   Ht 5\' 6"  (1.676 m)   Wt 142 lb 8 oz (64.6 kg)   SpO2 95%   BMI 23.00 kg/m   Body mass index is 23 kg/m.  Advanced Directives 04/22/2018 06/03/2017 02/27/2017 05/28/2016 10/15/2015 06/11/2015  Does Patient Have a Medical Advance Directive? Yes No No;Yes Yes Yes Yes  Type of Paramedic of [place];Living will - [place];Living will [place];Living will [place];Living will [place];Living will  Does patient want to make changes to medical advance directive? - - - No - Patient declined - No - Patient declined  Copy of [place] in Chart? No - copy requested - No - copy requested No - copy requested - No - copy requested    Tobacco Social History   Tobacco Use  Smoking Status Former Smoker  . Packs/day: 1.50  . Years: 15.00  . Pack years: 22.50  . Types: Cigarettes  . Start date: 11/17/1970  . Last attempt to quit: 11/17/1985  . Years since quitting: 32.4  Smokeless Tobacco Never Used  Tobacco Comment   smoking cessation materials not required     Counseling given: No Comment: smoking cessation materials not required  Clinical Intake:  Pre-visit preparation completed: Yes  Pain : No/denies pain   BMI - recorded: 23 Nutritional Status: BMI of 19-24  Normal Nutritional Risks: None Diabetes: No  How often do you need to have someone help you when you read instructions, pamphlets, or other written materials from your doctor or pharmacy?: 1 - Never  Interpreter Needed?:  No  Information entered by :: AEversole, LPN  Hospitalizations/ED visits and surgeries occurring within the previous 12 months:  Within the previous 12 months, pt has not underwent any surgical procedures, has not been hospitalized for any conditions and has not been treated by an emergency room clinician.  Past Medical History:  Diagnosis Date  . Cervical disc disease   . Cervical neuropathy   . Osteoporosis   . Tietze's disease    Past Surgical History:  Procedure Laterality Date  . COLONOSCOPY WITH PROPOFOL N/A 08/13/2015   Procedure: COLONOSCOPY WITH PROPOFOL;  Surgeon: Hulen Luster, MD;  Location: [facility] ENDOSCOPY;  Service: Gastroenterology;  Laterality: N/A;  . GUM SURGERY     Family History  Problem Relation Age of Onset  . Cancer Father        Colon   Social History   Socioeconomic History  . Marital status: Married    Spouse name: Glendell Docker  . Number of children: 0  . Years of education: Not on file  . Highest education level: Bachelor's degree (e.g., BA, AB, BS)  Occupational History  . Occupation: Retired  Scientific laboratory technician  . Financial resource strain: Not hard at all  . Food insecurity:    Worry: Never true    Inability: Never true  . Transportation needs:    Medical: No    Non-medical: No  Tobacco Use  . Smoking status: Former Smoker  Packs/day: 1.50    Years: 15.00    Pack years: 22.50    Types: Cigarettes    Start date: 11/17/1970    Last attempt to quit: 11/17/1985    Years since quitting: 32.4  . Smokeless tobacco: Never Used  . Tobacco comment: smoking cessation materials not required  Substance and Sexual Activity  . Alcohol use: No    Alcohol/week: 0.0 oz    Comment: occassional  . Drug use: No  . Sexual activity: Yes    Partners: Male  Lifestyle  . Physical activity:    Days per week: 5 days    Minutes per session: 60 min  . Stress: Not at all  Relationships  . Social connections:    Talks on phone: Patient refused    Gets together: Patient  refused    Attends religious service: Patient refused    Active member of club or organization: Patient refused    Attends meetings of clubs or organizations: Patient refused    Relationship status: Married  Other Topics Concern  . Not on file  Social History Narrative  . Not on file    Outpatient Encounter Medications as of 04/22/2018  Medication Sig  . calcium-vitamin D (OSCAL WITH D) 500-200 MG-UNIT tablet Take 1 tablet by mouth 2 (two) times daily.  Marland Kitchen loratadine (CLARITIN) 10 MG tablet TAKE 1 TABLET BY MOUTH EVERY DAY   No facility-administered encounter medications on file as of 04/22/2018.     Activities of Daily Living In your present state of health, do you have any difficulty performing the following activities: 04/22/2018 11/25/2017  Hearing? N N  Comment denies hearing aids -  Vision? N N  Comment wears eyeglasses -  Difficulty concentrating or making decisions? N N  Walking or climbing stairs? N N  Dressing or bathing? N N  Doing errands, shopping? N N  Preparing Food and eating ? N -  Comment partial upper dentures -  Using the Toilet? N -  In the past six months, have you accidently leaked urine? N -  Do you have problems with loss of bowel control? N -  Managing your Medications? N -  Managing your Finances? N -  Housekeeping or managing your Housekeeping? N -  Some recent data might be hidden    Patient Care Team: Steele Sizer, MD as PCP - General (Family Medicine) Thelma Comp, [place] (Optometry) Winfield Rast, DC as Consulting Physician (Chiropractic Medicine)    Assessment:   This is a routine wellness examination for Denise Perez.  Exercise Activities and Dietary recommendations Current Exercise Habits: Structured exercise class, Type of exercise: strength training/weights;treadmill, Time (Minutes): 60, Frequency (Times/Week): 5, Weekly Exercise (Minutes/Week): 300, Intensity: Mild, Exercise limited by: None identified  Goals    . DIET - INCREASE WATER  INTAKE     Recommend to drink at least 6-8 8oz glasses of water per day.       Fall Risk Fall Risk  04/22/2018 11/25/2017 05/28/2016 10/15/2015 06/11/2015  Falls in the past year? No No No No Yes  Number falls in past yr: - - - - 1  Injury with Fall? - - - - No  Risk for fall due to : History of fall(s);Impaired vision - - - -  Risk for fall due to: Comment wears eyeglasses - - - -   FALL RISK PREVENTION PERTAINING TO HOME: Is your home free of loose throw rugs in walkways, pet beds, electrical cords, etc? Yes Is there adequate lighting  in your home to reduce risk of falls?  Yes Are there stairs in or around your home WITH handrails? Yes  ASSISTIVE DEVICES UTILIZED TO PREVENT FALLS: Use of a cane, walker or w/c? No Grab bars in the bathroom? No  Shower chair or a place to sit while bathing? No An elevated toilet seat or a handicapped toilet? No  Timed Get Up and Go Performed: Yes. Pt ambulated 10 feet within 5 sec. Gait stead-fast and without the use of an assistive device. No intervention required at this time. Fall risk prevention has been discussed.  Community Resource Referral:  Pt declined my offer to send [employer] Referral to Care Guide for installation of grab bars in the shower, shower chair or an elevated toilet seat.  Depression Screen PHQ 2/9 Scores 04/22/2018 11/25/2017 05/28/2016 10/15/2015  PHQ - 2 Score 0 0 0 0  PHQ- 9 Score 0 - - -     Cognitive Function     6CIT Screen 04/22/2018 02/27/2017  What Year? 0 points 0 points  What month? 0 points 0 points  What time? 0 points 0 points  Count back from 20 0 points 0 points  Months in reverse 0 points 0 points  Repeat phrase 0 points 0 points  Total Score 0 0    Immunization History  Administered Date(s) Administered  . Influenza, High Dose Seasonal PF 08/14/2017  . Influenza-Unspecified 08/08/2014, 08/17/2017  . Pneumococcal Conjugate-13 04/22/2018  . Pneumococcal Polysaccharide-23 05/28/2016  . Tdap  03/08/2013  . Zoster 03/08/2013    Qualifies for Shingles Vaccine? Yes. Zostavax completed 03/08/13. Due for Shingrix. Education has been provided regarding the importance of this vaccine. Pt has been advised to call her insurance company to determine her out of pocket expense. Advised she may also receive this vaccine at her local pharmacy or Health Dept. Verbalized acceptance and understanding.  Screening Tests Health Maintenance  Topic Date Due  . MAMMOGRAM  04/03/2018  . INFLUENZA VACCINE  06/17/2018  . COLONOSCOPY  08/12/2020  . TETANUS/TDAP  03/09/2023  . DEXA SCAN  Completed  . Hepatitis C Screening  Completed  . PNA vac Low Risk Adult  Completed    Cancer Screenings: Lung: Low Dose CT Chest recommended if Age 43-80 years, 30 pack-year currently smoking OR have quit w/in 15years. Patient does not qualify. Breast:  Up to date on Mammogram? No. Completed 04/03/17. Ordered today. Pt provided with contact information and advised to schedule appt.   Up to date of Bone Density/Dexa? Yes. Completed 03/24/13. Osteoporotic screening no longer required Colorectal: Completed 08/13/15. Repeat every 5 years  Additional Screenings: Hepatitis C Screening: Completed 05/28/16    Plan:  I have personally reviewed and addressed the Medicare Annual Wellness questionnaire and have noted the following in the patient's chart:  A. Medical and social history B. Use of alcohol, tobacco or illicit drugs  C. Current medications and supplements D. Functional ability and status E.  Nutritional status F.  Physical activity G. Advance directives H. List of other physicians I.  Hospitalizations, surgeries, and ER visits in previous 12 months J.  [place] such as hearing and vision if needed, cognitive and depression L. Referrals and appointments  In addition, I have reviewed and discussed with patient certain preventive protocols, quality metrics, and best practice recommendations. A written  personalized care plan for preventive services as well as general preventive health recommendations were provided to patient.  See attached scanned questionnaire for additional information.   Signed,  Aleatha Borer, LPN Nurse Health Advisor   I have reviewed this encounter including the documentation in this note and/or discussed this patient with the provider, Aleatha Borer, LPN. I am certifying that I agree with the content of this note as supervising physician.  Steele Sizer, MD [facility] 04/23/2018, 1:38 PM

## 2018-04-22 NOTE — Patient Instructions (Signed)
Denise Perez , Thank you for taking time to come for your Medicare Wellness Visit. I appreciate your ongoing commitment to your health goals. Please review the following plan we discussed and let me know if I can assist you in the future.   Screening recommendations/referrals: Colorectal Screening: Up to date Mammogram: Please schedule your appointment to complete your mammogram Bone Density: Up to date Lung Cancer Screening: You do not qualify for this screening Hepatitis C Screening: Up to date  Vision and Dental Exams: Recommended annual ophthalmology exams for early detection of glaucoma and other disorders of the eye Recommended annual dental exams for proper oral hygiene  Vaccinations: Influenza vaccine: Up to date Pneumococcal vaccine: Completed today Tdap vaccine: Up to date Shingles vaccine: Please call your insurance company to determine your out of pocket expense for the Shingrix vaccine. You may also receive this vaccine at your local pharmacy or Health Dept.  Advanced directives: Please bring a copy of your POA (Power of Attorney) and/or Living Will to your next appointment.  Conditions/risks identified: Recommend to drink at least 6-8 8oz glasses of water per day.  Next appointment: Please schedule your Annual Wellness Visit with your Nurse Health Advisor in one year.  Preventive Care 68 Years and Older, Female Preventive care refers to lifestyle choices and visits with your health care provider that can promote health and wellness. What does preventive care include?  A yearly physical exam. This is also called an annual well check.  Dental exams once or twice a year.  Routine eye exams. Ask your health care provider how often you should have your eyes checked.  Personal lifestyle choices, including:  Daily care of your teeth and gums.  Regular physical activity.  Eating a healthy diet.  Avoiding tobacco and drug use.  Limiting alcohol use.  Practicing  safe sex.  Taking low-dose aspirin every day.  Taking vitamin and mineral supplements as recommended by your health care provider. What happens during an annual well check? The services and screenings done by your health care provider during your annual well check will depend on your age, overall health, lifestyle risk factors, and family history of disease. Counseling  Your health care provider may ask you questions about your:  Alcohol use.  Tobacco use.  Drug use.  Emotional well-being.  Home and relationship well-being.  Sexual activity.  Eating habits.  History of falls.  Memory and ability to understand (cognition).  Work and work Statistician.  Reproductive health. Screening  You may have the following tests or measurements:  Height, weight, and BMI.  Blood pressure.  Lipid and cholesterol levels. These may be checked every 5 years, or more frequently if you are over 49 years old.  Skin check.  Lung cancer screening. You may have this screening every year starting at age 68 if you have a 30-pack-year history of smoking and currently smoke or have quit within the past 15 years.  Fecal occult blood test (FOBT) of the stool. You may have this test every year starting at age 68.  Flexible sigmoidoscopy or colonoscopy. You may have a sigmoidoscopy every 5 years or a colonoscopy every 10 years starting at age 68.  Hepatitis C blood test.  Hepatitis B blood test.  Sexually transmitted disease (STD) testing.  Diabetes screening. This is done by checking your blood sugar (glucose) after you have not eaten for a while (fasting). You may have this done every 1-3 years.  Bone density scan. This is done to  screen for osteoporosis. You may have this done starting at age 68.  Mammogram. This may be done every 1-2 years. Talk to your health care provider about how often you should have regular mammograms. Talk with your health care provider about your test results,  treatment options, and if necessary, the need for more tests. Vaccines  Your health care provider may recommend certain vaccines, such as:  Influenza vaccine. This is recommended every year.  Tetanus, diphtheria, and acellular pertussis (Tdap, Td) vaccine. You may need a Td booster every 10 years.  Zoster vaccine. You may need this after age 68.  Pneumococcal 13-valent conjugate (PCV13) vaccine. One dose is recommended after age 68.  Pneumococcal polysaccharide (PPSV23) vaccine. One dose is recommended after age 68. Talk to your health care provider about which screenings and vaccines you need and how often you need them. This information is not intended to replace advice given to you by your health care provider. Make sure you discuss any questions you have with your health care provider. Document Released: 11/30/2015 Document Revised: 07/23/2016 Document Reviewed: 09/04/2015 Elsevier Interactive Patient Education  2017 [employer] Prevention in the Home Falls can cause injuries. They can happen to people of all ages. There are many things you can do to make your home safe and to help prevent falls. What can I do on the outside of my home?  Regularly fix the edges of walkways and driveways and fix any cracks.  Remove anything that might make you trip as you walk through a door, such as a raised step or threshold.  Trim any bushes or trees on the path to your home.  Use bright outdoor lighting.  Clear any walking paths of anything that might make someone trip, such as rocks or tools.  Regularly check to see if handrails are loose or broken. Make sure that both sides of any steps have handrails.  Any raised decks and porches should have guardrails on the edges.  Have any leaves, snow, or ice cleared regularly.  Use sand or salt on walking paths during winter.  Clean up any spills in your garage right away. This includes oil or grease spills. What can I do in the  bathroom?  Use night lights.  Install grab bars by the toilet and in the tub and shower. Do not use towel bars as grab bars.  Use non-skid mats or decals in the tub or shower.  If you need to sit down in the shower, use a plastic, non-slip stool.  Keep the floor dry. Clean up any water that spills on the floor as soon as it happens.  Remove soap buildup in the tub or shower regularly.  Attach bath mats securely with double-sided non-slip rug tape.  Do not have throw rugs and other things on the floor that can make you trip. What can I do in the bedroom?  Use night lights.  Make sure that you have a light by your bed that is easy to reach.  Do not use any sheets or blankets that are too big for your bed. They should not hang down onto the floor.  Have a firm chair that has side arms. You can use this for support while you get dressed.  Do not have throw rugs and other things on the floor that can make you trip. What can I do in the kitchen?  Clean up any spills right away.  Avoid walking on wet floors.  Keep items that  you use a lot in easy-to-reach places.  If you need to reach something above you, use a strong step stool that has a grab bar.  Keep electrical cords out of the way.  Do not use floor polish or wax that makes floors slippery. If you must use wax, use non-skid floor wax.  Do not have throw rugs and other things on the floor that can make you trip. What can I do with my stairs?  Do not leave any items on the stairs.  Make sure that there are handrails on both sides of the stairs and use them. Fix handrails that are broken or loose. Make sure that handrails are as long as the stairways.  Check any carpeting to make sure that it is firmly attached to the stairs. Fix any carpet that is loose or worn.  Avoid having throw rugs at the top or bottom of the stairs. If you do have throw rugs, attach them to the floor with carpet tape.  Make sure that you have a  light switch at the top of the stairs and the bottom of the stairs. If you do not have them, ask someone to add them for you. What else can I do to help prevent falls?  Wear shoes that:  Do not have high heels.  Have rubber bottoms.  Are comfortable and fit you well.  Are closed at the toe. Do not wear sandals.  If you use a stepladder:  Make sure that it is fully opened. Do not climb a closed stepladder.  Make sure that both sides of the stepladder are locked into place.  Ask someone to hold it for you, if possible.  Clearly mark and make sure that you can see:  Any grab bars or handrails.  First and last steps.  Where the edge of each step is.  Use tools that help you move around (mobility aids) if they are needed. These include:  Canes.  Walkers.  Scooters.  Crutches.  Turn on the lights when you go into a dark area. Replace any light bulbs as soon as they burn out.  Set up your furniture so you have a clear path. Avoid moving your furniture around.  If any of your floors are uneven, fix them.  If there are any pets around you, be aware of where they are.  Review your medicines with your doctor. Some medicines can make you feel dizzy. This can increase your chance of falling. Ask your doctor what other things that you can do to help prevent falls. This information is not intended to replace advice given to you by your health care provider. Make sure you discuss any questions you have with your health care provider. Document Released: 08/30/2009 Document Revised: 04/10/2016 Document Reviewed: 12/08/2014 Elsevier Interactive Patient Education  2017 [employer].

## 2018-05-13 ENCOUNTER — Ambulatory Visit (INDEPENDENT_AMBULATORY_CARE_PROVIDER_SITE_OTHER): Payer: PPO | Admitting: Family Medicine

## 2018-05-13 ENCOUNTER — Encounter: Payer: Self-pay | Admitting: Family Medicine

## 2018-05-13 VITALS — BP 118/64 | HR 75 | Temp 97.8°F | Resp 16 | Ht 66.0 in | Wt 142.1 lb

## 2018-05-13 DIAGNOSIS — Z0001 Encounter for general adult medical examination with abnormal findings: Secondary | ICD-10-CM

## 2018-05-13 DIAGNOSIS — Z0000 Encounter for general adult medical examination without abnormal findings: Secondary | ICD-10-CM

## 2018-05-13 DIAGNOSIS — Z1239 Encounter for other screening for malignant neoplasm of breast: Secondary | ICD-10-CM

## 2018-05-13 DIAGNOSIS — Z1231 Encounter for screening mammogram for malignant neoplasm of breast: Secondary | ICD-10-CM

## 2018-05-13 DIAGNOSIS — E785 Hyperlipidemia, unspecified: Secondary | ICD-10-CM

## 2018-05-13 DIAGNOSIS — M810 Age-related osteoporosis without current pathological fracture: Secondary | ICD-10-CM | POA: Diagnosis not present

## 2018-05-13 NOTE — Progress Notes (Addendum)
Name: Denise Perez   MRN: [id number]    DOB: Mar 29, 1951   Date:05/13/2018       Progress Note  Subjective  Chief Complaint  Chief Complaint  Patient presents with  . Annual Exam    HPI  Patient presents for annual CPE and osteoporosis  Osteoporosis: she refuses therapy, took Actonel in the past, for 5 years and stopped in 2011. We will repeat bone density, discussed Forteo and Prolia   Dyslipidemia: on life style modification only   Diet: healthy diet  Exercise: she has been physically active  USPSTF grade A and B recommendations  Depression:  Depression screen [facility] 2/9 05/13/2018 04/22/2018 11/25/2017 05/28/2016 10/15/2015  Decreased Interest 0 0 0 0 0  Down, Depressed, Hopeless 0 0 0 0 0  PHQ - 2 Score 0 0 0 0 0  Altered sleeping 0 0 - - -  Tired, decreased energy 0 0 - - -  Change in appetite 0 0 - - -  Feeling bad or failure about yourself  0 0 - - -  Trouble concentrating 0 0 - - -  Moving slowly or fidgety/restless 0 0 - - -  Suicidal thoughts 0 0 - - -  PHQ-9 Score 0 0 - - -  Difficult doing work/chores Not difficult at all Not difficult at all - - -   Hypertension: BP Readings from Last 3 Encounters:  05/13/18 118/64  04/22/18 102/60  01/11/18 108/70   Obesity: Wt Readings from Last 3 Encounters:  05/13/18 142 lb 1.6 oz (64.5 kg)  04/22/18 142 lb 8 oz (64.6 kg)  01/11/18 143 lb 6.4 oz (65 kg)   BMI Readings from Last 3 Encounters:  05/13/18 22.94 kg/m  04/22/18 23.00 kg/m  01/11/18 23.15 kg/m     HIV, hep B, hep C: N/A STD testing and prevention (chl/gon/syphilis): N/A Intimate partner violence: negative screen  Sexual History/Pain during Intercourse: no pain  Menstrual History/LMP/Abnormal Bleeding: post-menopausal  Incontinence Symptoms: none   Advanced Care Planning: A voluntary discussion about advance care planning including the explanation and discussion of advance directives.  Discussed health care proxy and Living will, and the patient  was able to identify a health care proxy as husband .  Patient does not have a living will at present time. If patient does have living will, I have requested they bring this to the clinic to be scanned in to their chart.  Breast cancer:  HM Mammogram  Date Value Ref Range Status  11/28/2015 Bi-Rads Category 1: Negative, Done at [place]  Final    BRCA gene screening: N/A Cervical cancer screening: same sexual partner, no previous abnormal   Osteoporosis:  Schedule now Fall prevention/vitamin D: continue supplementation  Lipids:  Lab Results  Component Value Date   CHOL 211 (H) 05/28/2016   Lab Results  Component Value Date   HDL 85 05/28/2016   Lab Results  Component Value Date   LDLCALC 113 05/28/2016   Lab Results  Component Value Date   TRIG 66 05/28/2016   Lab Results  Component Value Date   CHOLHDL 2.5 05/28/2016   No results found for: LDLDIRECT  Glucose:  Glucose, Bld  Date Value Ref Range Status  05/28/2016 83 65 - 99 mg/dL Final    Skin cancer: gets yearly screen  Colorectal cancer: done in 2016 repeat in 5 years  Lung cancer:   Low Dose CT Chest recommended if Age 90-80 years, 30 pack-year currently smoking OR have quit  w/in 15years. Patient does not qualify.   ZGY:FVCBS    Patient Active Problem List   Diagnosis Date Noted  . Dyslipidemia 05/28/2016  . Osteoporosis 05/28/2016  . Cervical disc disease 05/28/2016  . Primary osteoarthritis involving multiple joints 06/11/2015  . Synovial cyst of wrist 06/11/2015    Past Surgical History:  Procedure Laterality Date  . COLONOSCOPY WITH PROPOFOL N/A 08/13/2015   Procedure: COLONOSCOPY WITH PROPOFOL;  Surgeon: Hulen Luster, MD;  Location: [facility];  Service: Gastroenterology;  Laterality: N/A;  . GUM SURGERY      Family History  Problem Relation Age of Onset  . Cancer Father        Colon    Social History   Socioeconomic History  . Marital status: Married    Spouse name: Glendell Docker  .  Number of children: 0  . Years of education: Not on file  . Highest education level: Bachelor's degree (e.g., BA, AB, BS)  Occupational History  . Occupation: Retired  Scientific laboratory technician  . Financial resource strain: Not hard at all  . Food insecurity:    Worry: Never true    Inability: Never true  . Transportation needs:    Medical: No    Non-medical: No  Tobacco Use  . Smoking status: Former Smoker    Packs/day: 1.50    Years: 15.00    Pack years: 22.50    Types: Cigarettes    Start date: 11/17/1970    Last attempt to quit: 11/17/1985    Years since quitting: 32.5  . Smokeless tobacco: Never Used  . Tobacco comment: smoking cessation materials not required  Substance and Sexual Activity  . Alcohol use: Yes    Alcohol/week: 0.0 oz    Comment: 3 glasses of wine a year  . Drug use: No  . Sexual activity: Not Currently    Partners: Male  Lifestyle  . Physical activity:    Days per week: 5 days    Minutes per session: 60 min  . Stress: Not at all  Relationships  . Social connections:    Talks on phone: Patient refused    Gets together: Patient refused    Attends religious service: Patient refused    Active member of club or organization: Patient refused    Attends meetings of clubs or organizations: Patient refused    Relationship status: Married  . Intimate partner violence:    Fear of current or ex partner: No    Emotionally abused: No    Physically abused: No    Forced sexual activity: No  Other Topics Concern  . Not on file  Social History Narrative  . Not on file     Current Outpatient Medications:  .  calcium-vitamin D (OSCAL WITH D) 500-200 MG-UNIT tablet, Take 1 tablet by mouth 2 (two) times daily., Disp: 60 tablet, Rfl: 0 .  loratadine (CLARITIN) 10 MG tablet, TAKE 1 TABLET BY MOUTH EVERY DAY (Patient not taking: Reported on 05/13/2018), Disp: 90 tablet, Rfl: 0  Allergies  Allergen Reactions  . Prednisone Other (See Comments)    Shaking      ROS   Constitutional: Negative for fever or weight change.  Respiratory: Negative for cough and shortness of breath.   Cardiovascular: Negative for chest pain or palpitations.  Gastrointestinal: Negative for abdominal pain, no bowel changes.  Musculoskeletal: Negative for gait problem or joint swelling.  Skin: Negative for rash.  Neurological: Negative for dizziness or headache.  No other specific complaints in a  complete review of systems (except as listed in HPI above).   Objective  Vitals:   05/13/18 0906  BP: 118/64  Pulse: 75  Resp: 16  Temp: 97.8 F (36.6 C)  TempSrc: Oral  SpO2: 94%  Weight: 142 lb 1.6 oz (64.5 kg)  Height: '5\' 6"'  (1.676 m)    Body mass index is 22.94 kg/m.  Physical Exam  Constitutional: Patient appears well-developed and well-nourished. No distress.  HENT: Head: Normocephalic and atraumatic. Ears: B TMs ok, no erythema or effusion; Nose: Nose normal. Mouth/Throat: Oropharynx is clear and moist. No oropharyngeal exudate.  Eyes: Conjunctivae and EOM are normal. Pupils are equal, round, and reactive to light. No scleral icterus.  Neck: Normal range of motion. Neck supple. No JVD present. No thyromegaly present.  Cardiovascular: Normal rate, regular rhythm and normal heart sounds.  No murmur heard. No BLE edema. Pulmonary/Chest: Effort normal and breath sounds normal. No respiratory distress. Abdominal: Soft. Bowel sounds are normal, no distension. There is no tenderness. no masses Breast: no lumps or masses, no nipple discharge or rashes FEMALE GENITALIA:  Not done  RECTAL: not done  Musculoskeletal: Normal range of motion, no joint effusions. No gross deformities Neurological: he is alert and oriented to person, place, and time. No cranial nerve deficit. Coordination, balance, strength, speech and gait are normal.  Skin: Skin is warm and dry. No rash noted. No erythema.  Psychiatric: Patient has a normal mood and affect. behavior is  normal. Judgment and thought content normal.    PHQ2/9: Depression screen [facility] 2/9 05/13/2018 04/22/2018 11/25/2017 05/28/2016 10/15/2015  Decreased Interest 0 0 0 0 0  Down, Depressed, Hopeless 0 0 0 0 0  PHQ - 2 Score 0 0 0 0 0  Altered sleeping 0 0 - - -  Tired, decreased energy 0 0 - - -  Change in appetite 0 0 - - -  Feeling bad or failure about yourself  0 0 - - -  Trouble concentrating 0 0 - - -  Moving slowly or fidgety/restless 0 0 - - -  Suicidal thoughts 0 0 - - -  PHQ-9 Score 0 0 - - -  Difficult doing work/chores Not difficult at all Not difficult at all - - -     Fall Risk: Fall Risk  05/13/2018 04/22/2018 11/25/2017 05/28/2016 10/15/2015  Falls in the past year? No No No No No  Number falls in past yr: - - - - -  Injury with Fall? - - - - -  Risk for fall due to : - History of fall(s);Impaired vision - - -  Risk for fall due to: Comment - wears eyeglasses - - -     Functional Status Survey: Is the patient deaf or have difficulty hearing?: No Does the patient have difficulty seeing, even when wearing glasses/contacts?: No Does the patient have difficulty concentrating, remembering, or making decisions?: No Does the patient have difficulty walking or climbing stairs?: No Does the patient have difficulty dressing or bathing?: No Does the patient have difficulty doing errands alone such as visiting a doctor's office or shopping?: No  Assessment & Plan  1. Encounter for Medicare annual wellness exam  Discussed importance of 150 minutes of physical activity weekly, eat two servings of fish weekly, eat one serving of tree nuts ( cashews, pistachios, pecans, almonds.Marland Kitchen) every other day, eat 6 servings of fruit/vegetables daily and drink plenty of water and avoid sweet beverages.   2. Breast cancer screening  Already scheduled  3. Dyslipidemia  - Lipid panel -EKG : reviewed with patient today   4. Age-related osteoporosis without current pathological  fracture  Discussed importance of rechecking bone density and to reconsider medications, she will research about prolia and also forteo.  - DG Bone Density; Future - CBC with Differential/Platelet - COMPLETE METABOLIC PANEL WITH GFR - TSH - VITAMIN D 25 Hydroxy (Vit-D Deficiency, Fractures) - Parathyroid hormone, intact (no Ca)

## 2018-05-13 NOTE — Patient Instructions (Signed)
Check Prolia and Forteo for osteoporosis   Preventive Care 66 Years and Older, Female Preventive care refers to lifestyle choices and visits with your health care provider that can promote health and wellness. What does preventive care include?  A yearly physical exam. This is also called an annual well check.  Dental exams once or twice a year.  Routine eye exams. Ask your health care provider how often you should have your eyes checked.  Personal lifestyle choices, including: ? Daily care of your teeth and gums. ? Regular physical activity. ? Eating a healthy diet. ? Avoiding tobacco and drug use. ? Limiting alcohol use. ? Practicing safe sex. ? Taking low-dose aspirin every day. ? Taking vitamin and mineral supplements as recommended by your health care provider. What happens during an annual well check? The services and screenings done by your health care provider during your annual well check will depend on your age, overall health, lifestyle risk factors, and family history of disease. Counseling Your health care provider may ask you questions about your:  Alcohol use.  Tobacco use.  Drug use.  Emotional well-being.  Home and relationship well-being.  Sexual activity.  Eating habits.  History of falls.  Memory and ability to understand (cognition).  Work and work Statistician.  Reproductive health.  Screening You may have the following tests or measurements:  Height, weight, and BMI.  Blood pressure.  Lipid and cholesterol levels. These may be checked every 5 years, or more frequently if you are over 54 years old.  Skin check.  Lung cancer screening. You may have this screening every year starting at age 67 if you have a 30-pack-year history of smoking and currently smoke or have quit within the past 15 years.  Fecal occult blood test (FOBT) of the stool. You may have this test every year starting at age 70.  Flexible sigmoidoscopy or colonoscopy.  You may have a sigmoidoscopy every 5 years or a colonoscopy every 10 years starting at age 19.  Hepatitis C blood test.  Hepatitis B blood test.  Sexually transmitted disease (STD) testing.  Diabetes screening. This is done by checking your blood sugar (glucose) after you have not eaten for a while (fasting). You may have this done every 1-3 years.  Bone density scan. This is done to screen for osteoporosis. You may have this done starting at age 71.  Mammogram. This may be done every 1-2 years. Talk to your health care provider about how often you should have regular mammograms.  Talk with your health care provider about your test results, treatment options, and if necessary, the need for more tests. Vaccines Your health care provider may recommend certain vaccines, such as:  Influenza vaccine. This is recommended every year.  Tetanus, diphtheria, and acellular pertussis (Tdap, Td) vaccine. You may need a Td booster every 10 years.  Varicella vaccine. You may need this if you have not been vaccinated.  Zoster vaccine. You may need this after age 68.  Measles, mumps, and rubella (MMR) vaccine. You may need at least one dose of MMR if you were born in 1957 or later. You may also need a second dose.  Pneumococcal 13-valent conjugate (PCV13) vaccine. One dose is recommended after age 43.  Pneumococcal polysaccharide (PPSV23) vaccine. One dose is recommended after age 34.  Meningococcal vaccine. You may need this if you have certain conditions.  Hepatitis A vaccine. You may need this if you have certain conditions or if you travel or work in  places where you may be exposed to hepatitis A.  Hepatitis B vaccine. You may need this if you have certain conditions or if you travel or work in places where you may be exposed to hepatitis B.  Haemophilus influenzae type b (Hib) vaccine. You may need this if you have certain conditions.  Talk to your health care provider about which  screenings and vaccines you need and how often you need them. This information is not intended to replace advice given to you by your health care provider. Make sure you discuss any questions you have with your health care provider. Document Released: 11/30/2015 Document Revised: 07/23/2016 Document Reviewed: 09/04/2015 Elsevier Interactive Patient Education  [employer].

## 2018-05-14 LAB — LIPID PANEL
CHOLESTEROL: 240 mg/dL — AB (ref <200)
HDL: 77 mg/dL (ref >50)
LDL Cholesterol (Calc): 145 mg/dL (calc) — ABNORMAL HIGH
Non-HDL Cholesterol (Calc): 163 mg/dL (calc) — ABNORMAL HIGH (ref <130)
TRIGLYCERIDES: 74 mg/dL (ref <150)
Total CHOL/HDL Ratio: 3.1 (calc) (ref <5.0)

## 2018-05-14 LAB — CBC WITH DIFFERENTIAL/PLATELET
BASOS ABS: 39 {cells}/uL (ref 0–200)
Basophils Relative: 0.7 %
EOS ABS: 73 {cells}/uL (ref 15–500)
Eosinophils Relative: 1.3 %
HEMATOCRIT: 41.6 % (ref 35.0–45.0)
HEMOGLOBIN: 14 g/dL (ref 11.7–15.5)
LYMPHS ABS: 1882 {cells}/uL (ref 850–3900)
MCH: 31.6 pg (ref 27.0–33.0)
MCHC: 33.7 g/dL (ref 32.0–36.0)
MCV: 93.9 fL (ref 80.0–100.0)
MPV: 10 fL (ref 7.5–12.5)
Monocytes Relative: 8.9 %
NEUTROS ABS: 3108 {cells}/uL (ref 1500–7800)
Neutrophils Relative %: 55.5 %
Platelets: 224 10*3/uL (ref 140–400)
RBC: 4.43 10*6/uL (ref 3.80–5.10)
RDW: 13.5 % (ref 11.0–15.0)
Total Lymphocyte: 33.6 %
WBC: 5.6 10*3/uL (ref 3.8–10.8)
WBCMIX: 498 {cells}/uL (ref 200–950)

## 2018-05-14 LAB — COMPLETE METABOLIC PANEL WITH GFR
AG Ratio: 1.6 (calc) (ref 1.0–2.5)
ALKALINE PHOSPHATASE (APISO): 94 U/L (ref 33–130)
ALT: 14 U/L (ref 6–29)
AST: 20 U/L (ref 10–35)
Albumin: 4.1 g/dL (ref 3.6–5.1)
BILIRUBIN TOTAL: 0.5 mg/dL (ref 0.2–1.2)
BUN: 12 mg/dL (ref 7–25)
CHLORIDE: 106 mmol/L (ref 98–110)
CO2: 28 mmol/L (ref 20–32)
Calcium: 9 mg/dL (ref 8.6–10.4)
Creat: 0.82 mg/dL (ref 0.50–0.99)
GFR, EST AFRICAN AMERICAN: 86 mL/min/{1.73_m2} (ref >=60)
GFR, Est Non African American: 74 mL/min/{1.73_m2} (ref >=60)
GLOBULIN: 2.6 g/dL (ref 1.9–3.7)
Glucose, Bld: 77 mg/dL (ref 65–99)
Potassium: 4.1 mmol/L (ref 3.5–5.3)
SODIUM: 142 mmol/L (ref 135–146)
TOTAL PROTEIN: 6.7 g/dL (ref 6.1–8.1)

## 2018-05-14 LAB — PARATHYROID HORMONE, INTACT (NO CA): PTH: 49 pg/mL (ref 14–64)

## 2018-05-14 LAB — TSH: TSH: 0.76 m[IU]/L (ref 0.40–4.50)

## 2018-05-14 LAB — VITAMIN D 25 HYDROXY (VIT D DEFICIENCY, FRACTURES): Vit D, 25-Hydroxy: 23 ng/mL — ABNORMAL LOW (ref 30–100)

## 2018-05-17 ENCOUNTER — Ambulatory Visit
Admission: RE | Admit: 2018-05-17 | Discharge: 2018-05-17 | Disposition: A | Discharge status: Home / Self Care | Payer: PPO | Source: Ambulatory Visit | Attending: Family Medicine | Admitting: Family Medicine

## 2018-05-17 DIAGNOSIS — Z1239 Encounter for other screening for malignant neoplasm of breast: Secondary | ICD-10-CM

## 2018-05-17 DIAGNOSIS — Z1231 Encounter for screening mammogram for malignant neoplasm of breast: Secondary | ICD-10-CM | POA: Diagnosis not present

## 2018-05-26 ENCOUNTER — Ambulatory Visit
Admission: RE | Admit: 2018-05-26 | Discharge: 2018-05-26 | Disposition: A | Discharge status: Home / Self Care | Payer: PPO | Source: Ambulatory Visit | Attending: Family Medicine | Admitting: Family Medicine

## 2018-05-26 DIAGNOSIS — M810 Age-related osteoporosis without current pathological fracture: Secondary | ICD-10-CM | POA: Insufficient documentation

## 2018-05-26 DIAGNOSIS — Z780 Asymptomatic menopausal state: Secondary | ICD-10-CM | POA: Diagnosis not present

## 2018-05-28 ENCOUNTER — Telehealth: Payer: Self-pay | Admitting: *Deleted

## 2018-05-28 ENCOUNTER — Telehealth: Payer: Self-pay

## 2018-05-28 NOTE — Telephone Encounter (Signed)
-----   Message from Steele Sizer, MD sent at 05/27/2018  2:08 PM EDT ----- Osteoporosis on bone density , I recommend return visit to discuss medication and to check labs

## 2018-05-28 NOTE — Telephone Encounter (Signed)
Result note read to patient; verbalizes understanding. Appt made with Dr. Ancil Boozer July 07, 2018 to discuss options.  Telephone encounter as result note not routed to [facility].

## 2018-05-28 NOTE — Telephone Encounter (Signed)
Patient called.  Unable to reach patient. If patient calls back please inform her of her most recent Bone Density results.

## 2018-07-07 ENCOUNTER — Encounter: Payer: Self-pay | Admitting: Family Medicine

## 2018-07-07 ENCOUNTER — Ambulatory Visit (INDEPENDENT_AMBULATORY_CARE_PROVIDER_SITE_OTHER): Payer: PPO | Admitting: Family Medicine

## 2018-07-07 VITALS — BP 116/70 | HR 86 | Temp 98.0°F | Resp 16 | Ht 66.0 in | Wt 141.6 lb

## 2018-07-07 DIAGNOSIS — E785 Hyperlipidemia, unspecified: Secondary | ICD-10-CM

## 2018-07-07 DIAGNOSIS — M810 Age-related osteoporosis without current pathological fracture: Secondary | ICD-10-CM | POA: Diagnosis not present

## 2018-07-07 DIAGNOSIS — K529 Noninfective gastroenteritis and colitis, unspecified: Secondary | ICD-10-CM

## 2018-07-07 MED ORDER — VITAMIN D 50 MCG (2000 UT) PO CAPS: 1.0000 | ORAL_CAPSULE | Freq: Every day | ORAL | 0 refills | Status: AC

## 2018-07-07 NOTE — Patient Instructions (Signed)
Prolia Forteo Reclast Boniva IV

## 2018-07-07 NOTE — Progress Notes (Signed)
Name: Denise Perez   MRN: [id number]    DOB: 12/06/1950   Date:07/07/2018       Progress Note  Subjective  Chief Complaint  Chief Complaint  Patient presents with  . Results  . GI Problem    last wednesday she ate out with friends twice and dinner at home, shortly she had diarrhea. ? food poisoning. she has had intermittent diarrhea since then. questions if it was the shingles injection, coffee or ice cream (lactose intolerant)    HPI  Age related osteoporosis: reviewed bone density test , explained that was slight progression of osteoporosis and discussed medications options, for now she prefers exercising, adding weight while walking, take vitamin D and has tried increasing calcium in her diet. She has seen endocrinologist in the past   Dyslipidemia: we went over lab results, her ASCVD and she prefers not starting aspirin at this time. Only on life style modification at this time  Gastroenteritis: she states one week ago ( she ate breakfast and lunch with friends) that evening  she developed mild abdominal pain, with frequent bowel movements in a short period of time, but only streaks of blood when she wiped, no nausea or vomiting, no fever or chills and symptoms gradually improved over a period of 3 days, however yesterday she ate a bowel of cereal and had two loose stools and today one loose stool, no mucus or blood, appetite is normal.    Patient Active Problem List   Diagnosis Date Noted  . Dyslipidemia 05/28/2016  . Osteoporosis 05/28/2016  . Cervical disc disease 05/28/2016  . Primary osteoarthritis involving multiple joints 06/11/2015  . Synovial cyst of wrist 06/11/2015    Past Surgical History:  Procedure Laterality Date  . COLONOSCOPY WITH PROPOFOL N/A 08/13/2015   Procedure: COLONOSCOPY WITH PROPOFOL;  Surgeon: Hulen Luster, MD;  Location: [facility] ENDOSCOPY;  Service: Gastroenterology;  Laterality: N/A;  . GUM SURGERY      Family History  Problem Relation Age of Onset   . Cancer Father        Colon    Social History   Socioeconomic History  . Marital status: Married    Spouse name: Glendell Docker  . Number of children: 0  . Years of education: Not on file  . Highest education level: Bachelor's degree (e.g., BA, AB, BS)  Occupational History  . Occupation: Retired  Scientific laboratory technician  . Financial resource strain: Not hard at all  . Food insecurity:    Worry: Never true    Inability: Never true  . Transportation needs:    Medical: No    Non-medical: No  Tobacco Use  . Smoking status: Former Smoker    Packs/day: 1.50    Years: 15.00    Pack years: 22.50    Types: Cigarettes    Start date: 11/17/1970    Last attempt to quit: 11/17/1985    Years since quitting: 32.6  . Smokeless tobacco: Never Used  . Tobacco comment: smoking cessation materials not required  Substance and Sexual Activity  . Alcohol use: Yes    Alcohol/week: 0.0 standard drinks    Comment: 3 glasses of wine a year  . Drug use: No  . Sexual activity: Not Currently    Partners: Male  Lifestyle  . Physical activity:    Days per week: 5 days    Minutes per session: 60 min  . Stress: Not at all  Relationships  . Social connections:    Talks on  phone: More than three times a week    Gets together: More than three times a week    Attends religious service: More than 4 times per year    Active member of club or organization: Yes    Attends meetings of clubs or organizations: More than 4 times per year    Relationship status: Married  . Intimate partner violence:    Fear of current or ex partner: No    Emotionally abused: No    Physically abused: No    Forced sexual activity: No  Other Topics Concern  . Not on file  Social History Narrative  . Not on file     Current Outpatient Medications:  .  calcium-vitamin D (OSCAL WITH D) 500-200 MG-UNIT tablet, Take 1 tablet by mouth 2 (two) times daily., Disp: 60 tablet, Rfl: 0 .  loratadine (CLARITIN) 10 MG tablet, TAKE 1 TABLET BY MOUTH  EVERY DAY, Disp: 90 tablet, Rfl: 0  Allergies  Allergen Reactions  . Prednisone Other (See Comments)    Shaking     ROS  Ten systems reviewed and is negative except as mentioned in HPI   Objective  Vitals:   07/07/18 1350  BP: 116/70  Pulse: 86  Resp: 16  Temp: 98 F (36.7 C)  TempSrc: Oral  SpO2: 97%  Weight: 141 lb 9.6 oz (64.2 kg)  Height: 5\' 6"  (1.676 m)    Body mass index is 22.85 kg/m.  Physical Exam  Constitutional: Patient appears well-developed and well-nourished.No distress.  HEENT: head atraumatic, normocephalic, pupils equal and reactive to light,  neck supple, throat within normal limits Cardiovascular: Normal rate, regular rhythm and normal heart sounds.  No murmur heard. No BLE edema. Pulmonary/Chest: Effort normal and breath sounds normal. No respiratory distress. Abdominal: Soft.  There is no tenderness., normal bowel sounds  Psychiatric: Patient has a normal mood and affect. behavior is normal. Judgment and thought content normal.  Recent Results (from the past 2160 hour(s))  CBC with Differential/Platelet     Status: None   Collection Time: 05/13/18  9:59 AM  Result Value Ref Range   WBC 5.6 3.8 - 10.8 Thousand/uL   RBC 4.43 3.80 - 5.10 Million/uL   Hemoglobin 14.0 11.7 - 15.5 g/dL   HCT 41.6 35.0 - 45.0 %   MCV 93.9 80.0 - 100.0 fL   MCH 31.6 27.0 - 33.0 pg   MCHC 33.7 32.0 - 36.0 g/dL   RDW 13.5 11.0 - 15.0 %   Platelets 224 140 - 400 Thousand/uL   MPV 10.0 7.5 - 12.5 fL   Neutro Abs 3,108 1,500 - 7,800 cells/uL   Lymphs Abs 1,882 850 - 3,900 cells/uL   WBC mixed population 498 200 - 950 cells/uL   Eosinophils Absolute 73 15 - 500 cells/uL   Basophils Absolute 39 0 - 200 cells/uL   Neutrophils Relative % 55.5 %   Total Lymphocyte 33.6 %   Monocytes Relative 8.9 %   Eosinophils Relative 1.3 %   Basophils Relative 0.7 %  COMPLETE METABOLIC PANEL WITH GFR     Status: None   Collection Time: 05/13/18  9:59 AM  Result Value Ref Range    Glucose, Bld 77 65 - 99 mg/dL    Comment: .            Fasting reference interval .    BUN 12 7 - 25 mg/dL   Creat 0.82 0.50 - 0.99 mg/dL    Comment: For patients >49 years  of age, the reference limit for Creatinine is approximately 13% higher for people identified as African-American. .    GFR, Est Non African American 74 > OR = 60 mL/min/1.55m2   GFR, Est African American 86 > OR = 60 mL/min/1.42m2   BUN/Creatinine Ratio NOT APPLICABLE 6 - 22 (calc)   Sodium 142 135 - 146 mmol/L   Potassium 4.1 3.5 - 5.3 mmol/L   Chloride 106 98 - 110 mmol/L   CO2 28 20 - 32 mmol/L   Calcium 9.0 8.6 - 10.4 mg/dL   Total Protein 6.7 6.1 - 8.1 g/dL   Albumin 4.1 3.6 - 5.1 g/dL   Globulin 2.6 1.9 - 3.7 g/dL (calc)   AG Ratio 1.6 1.0 - 2.5 (calc)   Total Bilirubin 0.5 0.2 - 1.2 mg/dL   Alkaline phosphatase (APISO) 94 33 - 130 U/L   AST 20 10 - 35 U/L   ALT 14 6 - 29 U/L  Lipid panel     Status: Abnormal   Collection Time: 05/13/18  9:59 AM  Result Value Ref Range   Cholesterol 240 (H) <200 mg/dL   HDL 77 >50 mg/dL   Triglycerides 74 <150 mg/dL   LDL Cholesterol (Calc) 145 (H) mg/dL (calc)    Comment: Reference range: <100 . Desirable range <100 mg/dL for primary prevention;   <70 mg/dL for patients with CHD or diabetic patients  with > or = 2 CHD risk factors. Marland Kitchen LDL-C is now calculated using the Martin-Hopkins  calculation, which is a validated novel method providing  better accuracy than the Friedewald equation in the  estimation of LDL-C.  Cresenciano Genre et al. Annamaria Helling. 7124;580(99): 2061-2068  (http://education.[facility].com/faq/FAQ164)    Total CHOL/HDL Ratio 3.1 <5.0 (calc)   Non-HDL Cholesterol (Calc) 163 (H) <130 mg/dL (calc)    Comment: For patients with diabetes plus 1 major ASCVD risk  factor, treating to a non-HDL-C goal of <100 mg/dL  (LDL-C of <70 mg/dL) is considered a therapeutic  option.   TSH     Status: None   Collection Time: 05/13/18  9:59 AM  Result Value  Ref Range   TSH 0.76 0.40 - 4.50 mIU/L  VITAMIN D 25 Hydroxy (Vit-D Deficiency, Fractures)     Status: Abnormal   Collection Time: 05/13/18  9:59 AM  Result Value Ref Range   Vit D, 25-Hydroxy 23 (L) 30 - 100 ng/mL    Comment: Vitamin D Status         25-OH Vitamin D: . Deficiency:                    <20 ng/mL Insufficiency:             20 - 29 ng/mL Optimal:                 > or = 30 ng/mL . For 25-OH Vitamin D testing on patients on  D2-supplementation and patients for whom quantitation  of D2 and D3 fractions is required, the [facility]ure[facility](TM) 25-OH VIT D, (D2,D3), LC/MS/MS is recommended: order  code 313 [id number] 8157 (patients >66yrs). . For more information on this test, go to: http://education.[facility].com/faq/FAQ163 (This link is being provided for  informational/educational purposes only.)   Parathyroid hormone, intact (no Ca)     Status: None   Collection Time: 05/13/18  9:59 AM  Result Value Ref Range   PTH 49 14 - 64 pg/mL    Comment: . Interpretive Guide    Intact PTH  Calcium ------------------    ----------           ------- Normal Parathyroid    Normal               Normal Hypoparathyroidism    Low or Low Normal    Low Hyperparathyroidism    Primary            Normal or High       High    Secondary          High                 Normal or Low    Tertiary           High                 High Non-Parathyroid    Hypercalcemia      Low or Low Normal    High .      PHQ2/9: Depression screen [facility] 2/9 07/07/2018 05/13/2018 04/22/2018 11/25/2017 05/28/2016  Decreased Interest 0 0 0 0 0  Down, Depressed, Hopeless 0 0 0 0 0  PHQ - 2 Score 0 0 0 0 0  Altered sleeping 0 0 0 - -  Tired, decreased energy 0 0 0 - -  Change in appetite 0 0 0 - -  Feeling bad or failure about yourself  0 0 0 - -  Trouble concentrating 0 0 0 - -  Moving slowly or fidgety/restless 0 0 0 - -  Suicidal thoughts 0 0 0 - -  PHQ-9 Score 0 0 0 - -  Difficult doing work/chores Not difficult at  all Not difficult at all Not difficult at all - -     Fall Risk: Fall Risk  07/07/2018 05/13/2018 04/22/2018 11/25/2017 05/28/2016  Falls in the past year? No No No No No  Number falls in past yr: - - - - -  Injury with Fall? - - - - -  Risk for fall due to : - - History of fall(s);Impaired vision - -  Risk for fall due to: Comment - - wears eyeglasses - -    Functional Status Survey: Is the patient deaf or have difficulty hearing?: No Does the patient have difficulty seeing, even when wearing glasses/contacts?: No Does the patient have difficulty concentrating, remembering, or making decisions?: No Does the patient have difficulty walking or climbing stairs?: No Does the patient have difficulty dressing or bathing?: No Does the patient have difficulty doing errands alone such as visiting a doctor's office or shopping?: No   Assessment & Plan  1. Age-related osteoporosis without current pathological fracture  She has seen Endocrinologist in the past and refused therapy, too afraid of side effects, she wants to continue physical activity, she will add vitamin D , high calcium diet and monitor, recheck in 2 years.  - Cholecalciferol (VITAMIN D) 2000 units CAPS; Take 1 capsule (2,000 Units total) by mouth daily.  Dispense: 30 capsule; Refill: 0  2. Dyslipidemia  On life style modification  3. Gastroenteritis, acute  Try probiotic, resume dairy slowly and call back if no resolution of symptoms in 2 weeks.

## 2018-12-03 DIAGNOSIS — H0288A Meibomian gland dysfunction right eye, upper and lower eyelids: Secondary | ICD-10-CM | POA: Diagnosis not present

## 2018-12-03 DIAGNOSIS — H2513 Age-related nuclear cataract, bilateral: Secondary | ICD-10-CM | POA: Diagnosis not present

## 2018-12-03 DIAGNOSIS — H0288B Meibomian gland dysfunction left eye, upper and lower eyelids: Secondary | ICD-10-CM | POA: Diagnosis not present

## 2018-12-03 DIAGNOSIS — H524 Presbyopia: Secondary | ICD-10-CM | POA: Diagnosis not present

## 2018-12-03 DIAGNOSIS — H5213 Myopia, bilateral: Secondary | ICD-10-CM | POA: Diagnosis not present

## 2018-12-17 ENCOUNTER — Encounter: Payer: Self-pay | Admitting: Nurse Practitioner

## 2018-12-17 ENCOUNTER — Ambulatory Visit (INDEPENDENT_AMBULATORY_CARE_PROVIDER_SITE_OTHER): Payer: PPO | Admitting: Nurse Practitioner

## 2018-12-17 VITALS — BP 118/72 | HR 95 | Temp 99.8°F | Resp 16 | Ht 66.0 in | Wt 141.2 lb

## 2018-12-17 DIAGNOSIS — R509 Fever, unspecified: Secondary | ICD-10-CM

## 2018-12-17 DIAGNOSIS — R5383 Other fatigue: Secondary | ICD-10-CM | POA: Diagnosis not present

## 2018-12-17 DIAGNOSIS — R05 Cough: Secondary | ICD-10-CM | POA: Diagnosis not present

## 2018-12-17 DIAGNOSIS — R059 Cough, unspecified: Secondary | ICD-10-CM

## 2018-12-17 LAB — POCT INFLUENZA A/B
Influenza A, POC: NEGATIVE
Influenza B, POC: NEGATIVE

## 2018-12-17 NOTE — Patient Instructions (Signed)
At this time your fever does not raise any red flags or concerns for [place]. You may have a low-grade fever as your body is fighting off an upper respiratory infection, that seems viral in nature. If your fevers continue to persist over the next few days, or you develop any new symptoms please follow up. Drink plenty of water, rest, eat good nutritious foods with vitamin C. You can take musinex-DM for your cough. Stay away from hospitals, and other public places until your symptoms resolve. Do not share drinks, food, towels with others. Cover your cough and wash your hands frequently.   If you develop shortness of breath, chest pain, nausea, vomiting, rashes or any new or concerning symptoms please seek immidiate medical care.   Fever, Adult A fever is an increase in your body's temperature. It often means a temperature of 100.19F (38C) or higher. Brief mild or moderate fevers often have no long-term effects. They often do not need treatment. Moderate or high fevers may make you feel uncomfortable. Sometimes, they can be a sign of a serious illness or disease. A fever that keeps coming back or that lasts a long time may cause you to lose water in your body (get dehydrated). You can take your temperature with a thermometer to see if you have a fever. Temperature can change with:  Age.  Time of day.  Where the thermometer is put in the body. Readings may vary when the thermometer is put: ? In the mouth (oral). ? In the butt (rectal). ? In the ear (tympanic). ? Under the arm (axillary). ? On the forehead (temporal). Follow these instructions at home: Medicines  Take over-the-counter and prescription medicines only as told by your doctor. Follow the dosing instructions carefully.  If you were prescribed an antibiotic medicine, take it as told by your doctor. Do not stop taking it even if you start to feel better. General instructions  Watch for any changes in your symptoms. Tell your doctor about  them.  Rest as needed.  Drink enough fluid to keep your pee (urine) pale yellow.  Sponge yourself or bathe with room-temperature water as needed. This helps to lower your body temperature. Do not use ice water.  Do not use too many blankets or wear clothes that are too heavy.  If your fever was caused by an infection that spreads from person to person (is contagious), such as a cold or the flu: ? You should stay home from work and public places for at least 24 hours after your fever is gone. ? Your fever should be gone for at least 24 hours without the need to use medicines. Contact a doctor if:  You throw up (vomit).  You cannot eat or drink without throwing up.  You have watery poop (diarrhea).  It hurts when you pee.  Your symptoms do not get better with treatment.  You have new symptoms.  You feel very weak. Get help right away if:  You are short of breath or have trouble breathing.  You are dizzy or you pass out (faint).  You feel mixed up (confused).  You have signs of not having enough water in your body, such as: ? Dark pee, very little pee, or no pee. ? Cracked lips. ? Dry mouth. ? Sunken eyes. ? Sleepiness. ? Weakness.  You have very bad pain in your belly (abdomen).  You keep throwing up or having watery poop.  You have a rash on your skin.  Your symptoms  get worse all of a sudden. Summary  A fever is an increase in your body's temperature. It often means a temperature of 100.24F (38C) or higher.  Watch for any changes in your symptoms. Tell your doctor about them.  Take all medicines only as told by your doctor.  Do not go to work or other public places if your fever was caused by an illness that can spread to other people.  Get help right away if you have signs that you do not have enough water in your body. This information is not intended to replace advice given to you by your health care provider. Make sure you discuss any questions you  have with your health care provider. Document Released: 08/12/2008 Document Revised: 04/19/2018 Document Reviewed: 04/19/2018 Elsevier Interactive Patient Education  2019 [employer].

## 2018-12-17 NOTE — Progress Notes (Signed)
Name: Denise Perez MRN: [id number] DOB: 1950/11/28 Date:12/17/2018 Progress Note Subjective Chief Complaint Chief Complaint  Patient presents with  . Fever  . Cough    patient's husband gave her a concoction of honey, lemon & whiskey. hot tea  & candy cane.   HPI Pt presents to the clinic with fever. Last 101F fever in the afternoon, which was elevated from 49F at 10 am earlier in the day. Pt endorses a dry, hacking, nonproductive cough. Positive for fatigue and fever. Has been visiting a sick family relative at the hospital recently. Denies chest pain and shortness of breath.   Patient Active Problem List   Diagnosis Date Noted  . Dyslipidemia 05/28/2016  . Osteoporosis 05/28/2016  . Cervical disc disease 05/28/2016  . Primary osteoarthritis involving multiple joints 06/11/2015  . Synovial cyst of wrist 06/11/2015   Past Medical History:  Diagnosis Date  . Cervical disc disease   . Cervical neuropathy   . Osteoporosis   . Tietze's disease    Past Surgical History:  Procedure Laterality Date  . COLONOSCOPY WITH PROPOFOL N/A 08/13/2015   Procedure: COLONOSCOPY WITH PROPOFOL;  Surgeon: Hulen Luster, MD;  Location: [facility] ENDOSCOPY;  Service: Gastroenterology;  Laterality: N/A;  . GUM SURGERY     Social History   Tobacco Use  . Smoking status: Former Smoker    Packs/day: 1.50    Years: 15.00    Pack years: 22.50    Types: Cigarettes    Start date: 11/17/1970    Last attempt to quit: 11/17/1985    Years since quitting: 33.1  . Smokeless tobacco: Never Used  . Tobacco comment: smoking cessation materials not required  Substance Use Topics  . Alcohol use: Yes    Alcohol/week: 0.0 standard drinks    Comment: 3 glasses of wine a year    Current Outpatient Medications:  .  Cholecalciferol (VITAMIN D) 2000 units CAPS, Take 1 capsule (2,000 Units total) by mouth daily., Disp: 30 capsule, Rfl: 0 Allergies  Allergen Reactions  . Prednisone Other (See Comments)    Shaking    Review of Systems  HENT: Negative for ear pain and sore throat.   Respiratory: Negative for wheezing.   Gastrointestinal: Negative for nausea and vomiting.  Musculoskeletal: Negative for myalgias.   No other specific complaints in a complete review of systems (except as listed in HPI above). Objective Vitals:   12/17/18 1357  BP: 118/72  Pulse: 95  Resp: 16  Temp: 99.8 F (37.7 C)  TempSrc: Oral  SpO2: 97%  Weight: 141 lb 3.2 oz (64 kg)  Height: 5\' 6"  (1.676 m)    Body mass index is 22.79 kg/m. Nursing Note and Vital Signs reviewed. Physical Exam Constitutional:      Appearance: Normal appearance.  HENT:     Head: Normocephalic.     Right Ear: Tympanic membrane normal.     Left Ear: Tympanic membrane normal.     Nose: No rhinorrhea.     Right Sinus: No maxillary sinus tenderness or frontal sinus tenderness.     Left Sinus: No maxillary sinus tenderness or frontal sinus tenderness.     Mouth/Throat:     Pharynx: Uvula midline. No posterior oropharyngeal erythema or uvula swelling.  Eyes:     General:        Right eye: No discharge.        Left eye: No discharge.  Cardiovascular:     Rate and Rhythm: Normal rate and regular rhythm.  Pulses: Normal pulses.     Heart sounds: Normal heart sounds.  Pulmonary:     Effort: Pulmonary effort is normal.     Breath sounds: Normal breath sounds. No wheezing.  Lymphadenopathy:     Head:     Right side of head: No submental or submandibular adenopathy.     Left side of head: No submental or submandibular adenopathy.     Cervical: No cervical adenopathy.     Right cervical: No deep cervical adenopathy.    Left cervical: No deep cervical adenopathy.  Neurological:     Mental Status: She is alert.    ? No results found for this or any previous visit (from the past 48 hour(s)). Assessment & Plan ? 1. Fever and chills Negative flu swab. Education about using proper hand hygiene and avoiding being in contact with  other individuals. Provided information about obtaining proper rest and hydration. Advised to take OTC ibuprofen/acetaminophen to help with fever. If fever still persists or symptom worsen, advise to return for a follow up. - POCT Influenza A/B  2. Cough Negative flu swab. Educated the importance of proper rest and hydration.  - POCT Influenza A/B  3. Fatigue, unspecified type Educated the importance of proper rest. If symptoms worsen, please return for a follow-up. - POCT Influenza A/B  -Red flags and when to present for emergency care or RTC including fever >101.27F, chest pain, shortness of breath, new/worsening/un-resolving symptoms, reviewed with patient at time of visit. Follow up and care instructions discussed and provided in AVS.

## 2019-01-13 IMAGING — MG MM DIGITAL SCREENING BILAT W/ CAD
4 series · 4 of 4 positions shown · non-contrast
Comparison: Previous exam(s).

CLINICAL DATA: Screening.

EXAM:
DIGITAL SCREENING BILATERAL MAMMOGRAM WITH CAD

[R MLO]
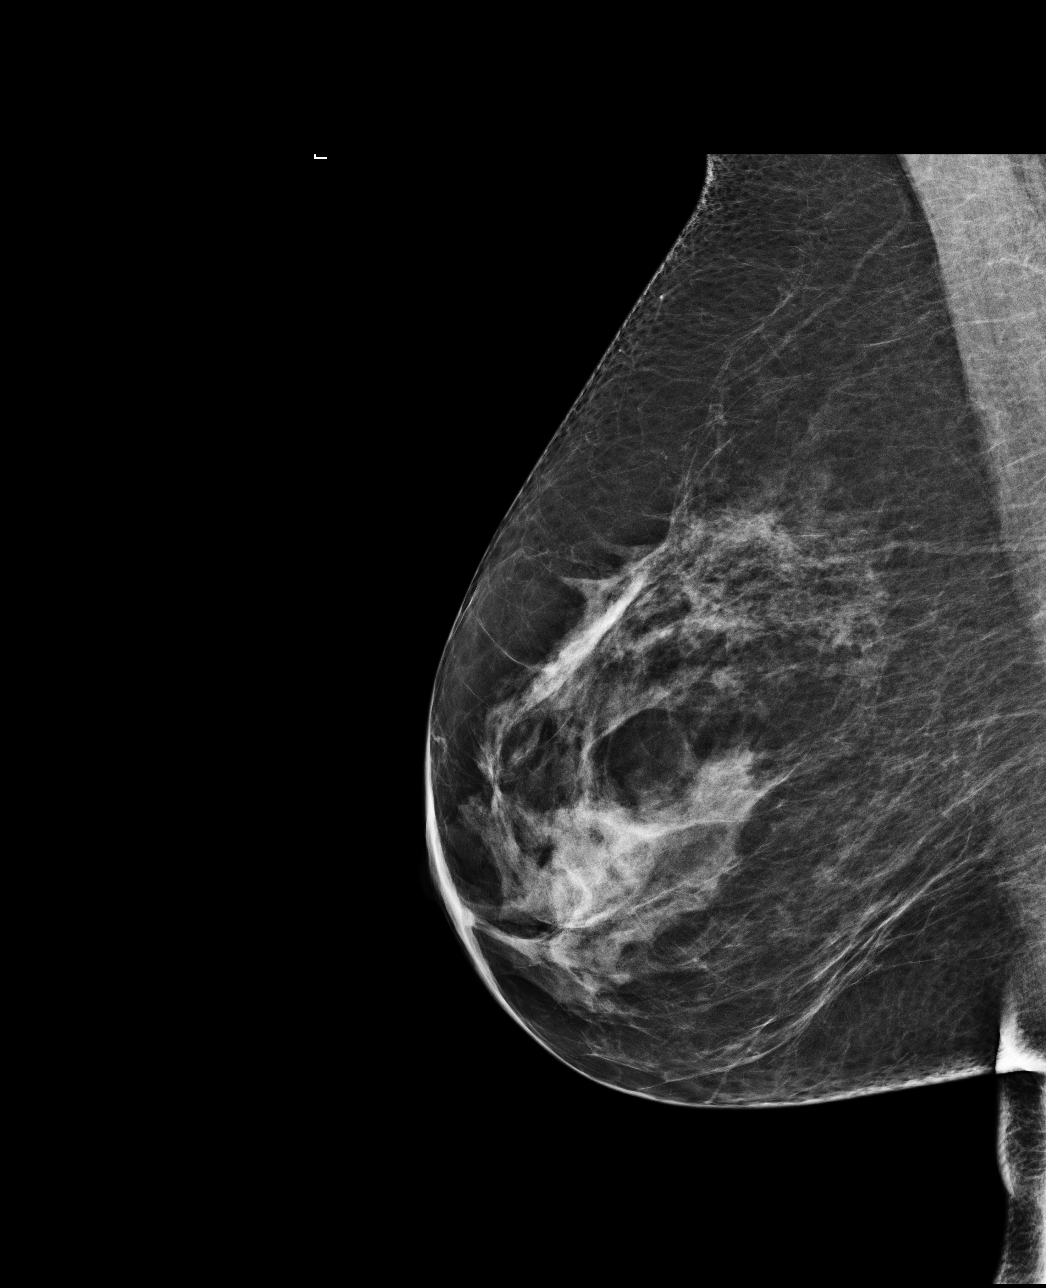

[L MLO]
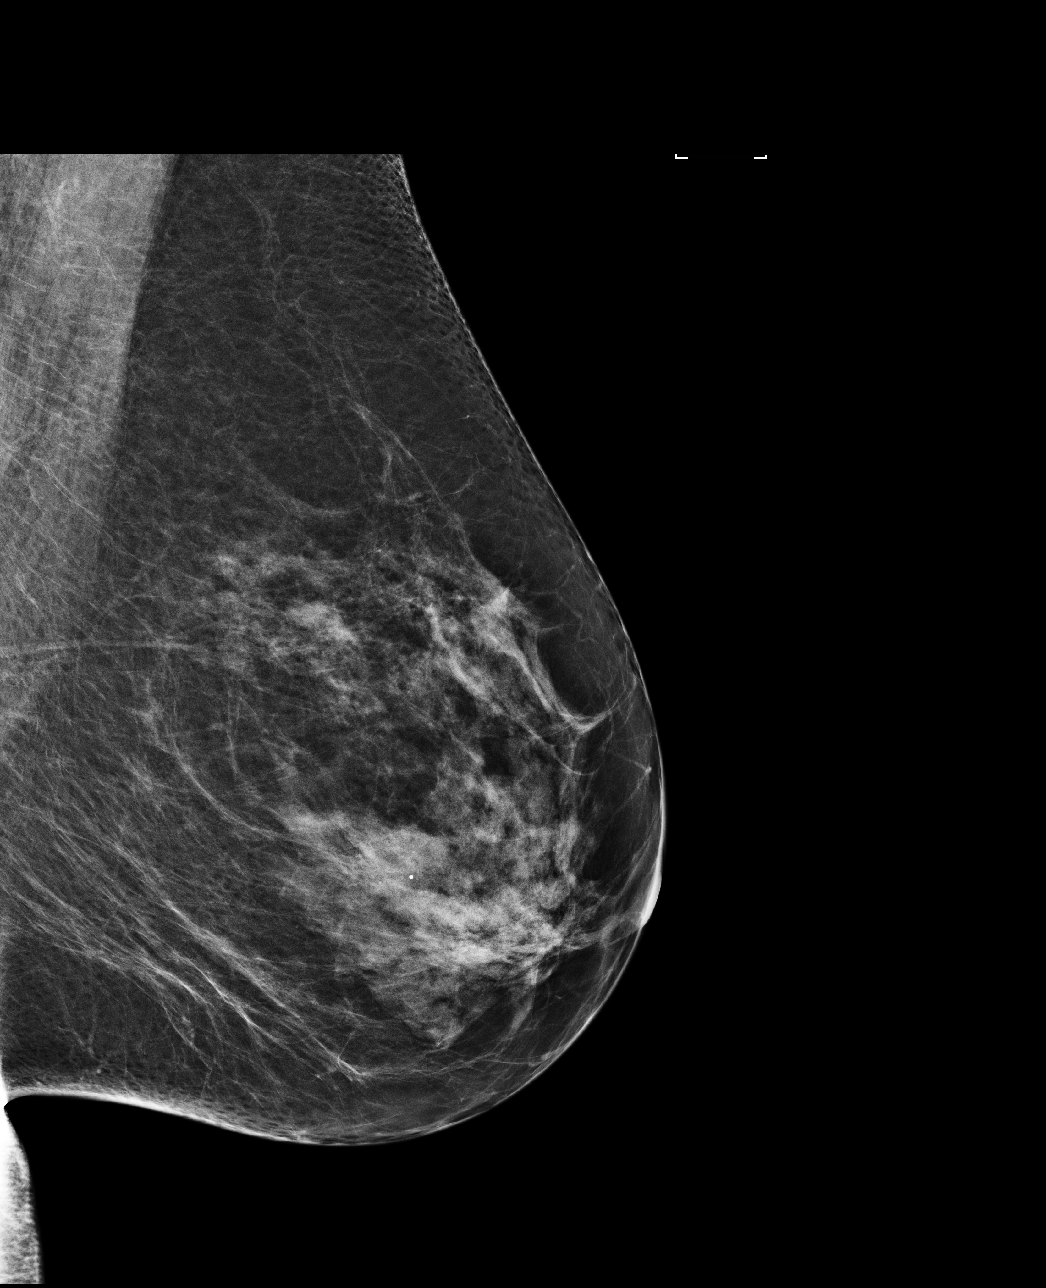

[R CC]
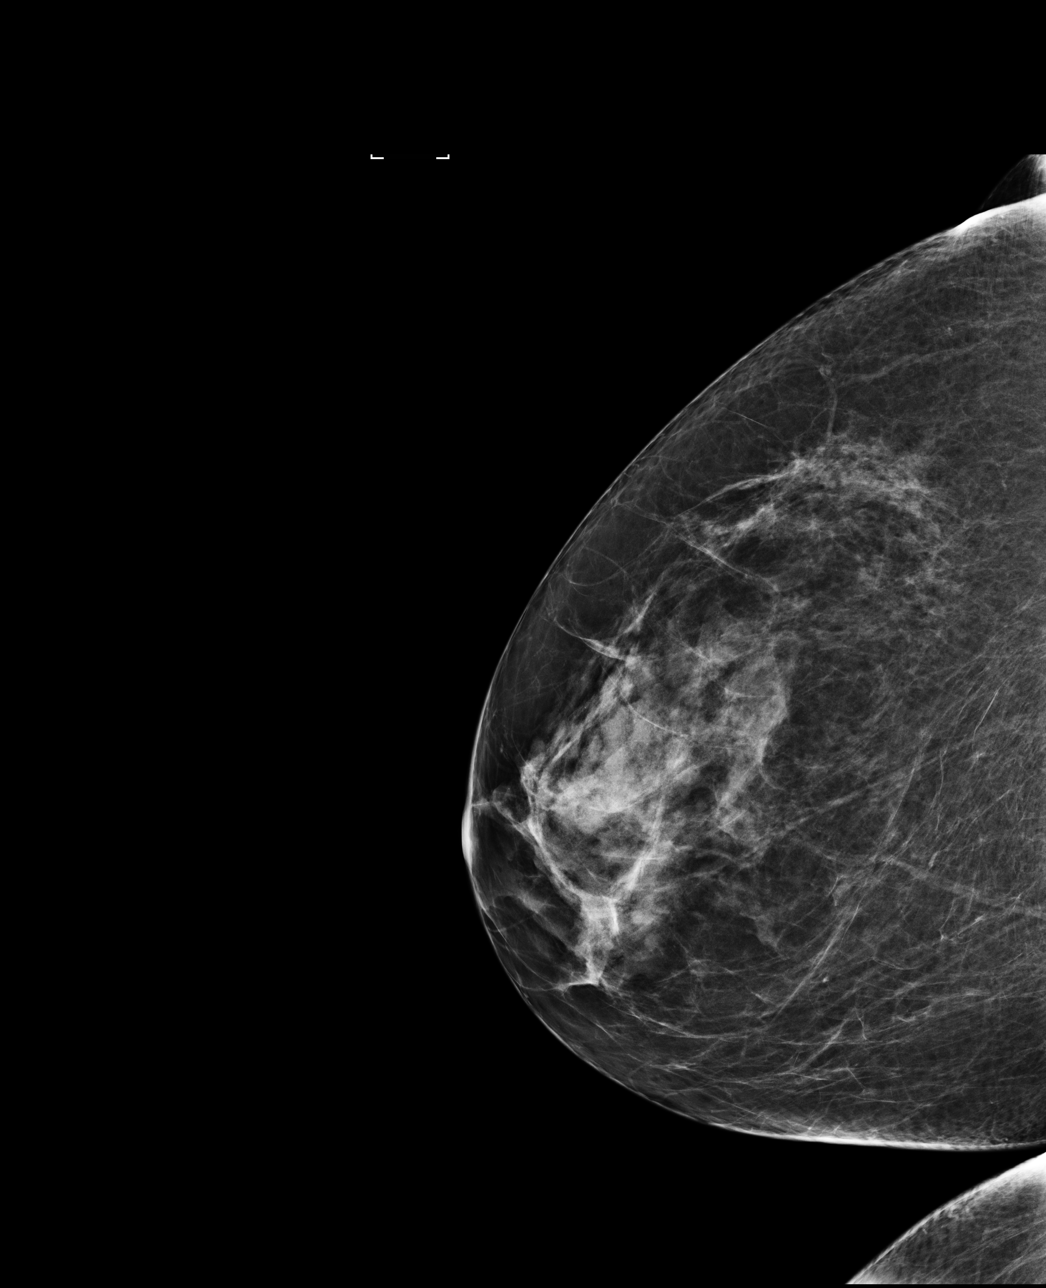

[L CC]
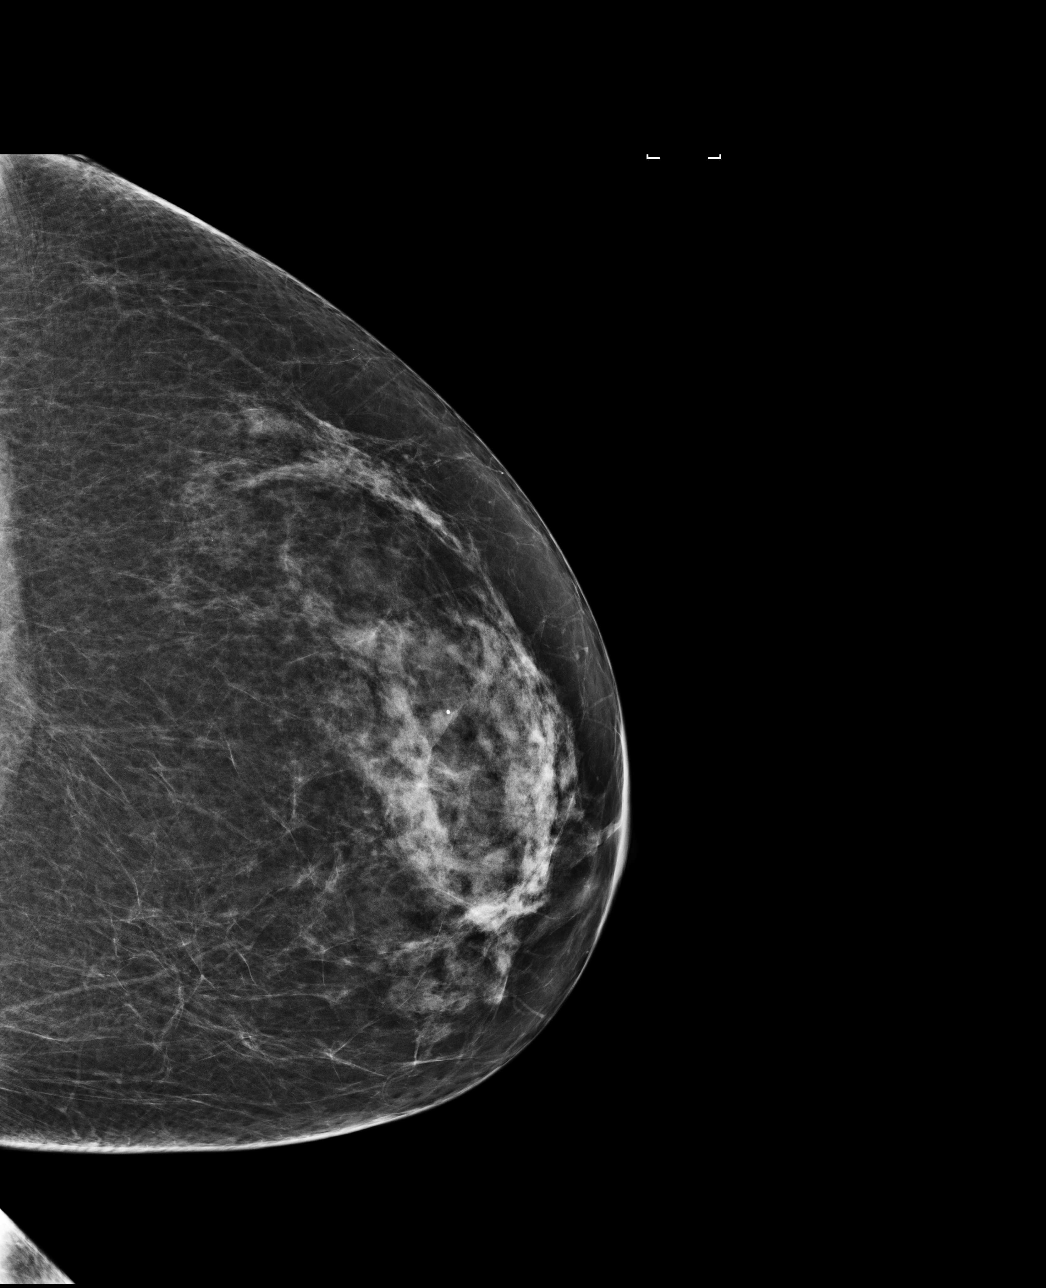

[4 of 4 positions shown; findings below may reference images not displayed]

ACR Breast Density Category b: There are scattered areas of
fibroglandular density.
FINDINGS: There are no findings suspicious for malignancy. Images were
processed with CAD.
IMPRESSION: No mammographic evidence of malignancy. A result letter of this
screening mammogram will be mailed directly to the patient.

RECOMMENDATION:
Screening mammogram in one year. (Code:3E-0-FZA)

BI-RADS CATEGORY  1: Negative.

:
Please Insert Correct Screening Template

## 2019-02-14 ENCOUNTER — Telehealth: Payer: Self-pay | Admitting: Family Medicine

## 2019-02-14 NOTE — Telephone Encounter (Addendum)
Contacted Denise Perez per her request; she states that in January 2020 she had a dry cough that lasted for  5-6 weeks; and a fever 3-4 days, she was also negative for the flu; the Denise Perez would like to know if she could have had the corona virus; explained to Denise Perez that she would had to be tested at that time; she verbalized understand"; she normally sees Dr Ancil Boozer, [facility]; will route to office for notification of this encounter.

## 2019-02-14 NOTE — Telephone Encounter (Signed)
Patient notified

## 2019-04-29 ENCOUNTER — Ambulatory Visit (INDEPENDENT_AMBULATORY_CARE_PROVIDER_SITE_OTHER): Payer: PPO

## 2019-04-29 ENCOUNTER — Other Ambulatory Visit: Payer: Self-pay

## 2019-04-29 VITALS — BP 112/82 | HR 55 | Temp 98.1°F | Resp 16 | Ht 66.0 in | Wt 141.1 lb

## 2019-04-29 DIAGNOSIS — Z0000 Encounter for general adult medical examination without abnormal findings: Secondary | ICD-10-CM | POA: Diagnosis not present

## 2019-04-29 DIAGNOSIS — Z1231 Encounter for screening mammogram for malignant neoplasm of breast: Secondary | ICD-10-CM | POA: Diagnosis not present

## 2019-04-29 NOTE — Progress Notes (Signed)
Subjective:   Denise Perez is a 68 y.o. female who presents for Medicare Annual (Subsequent) preventive examination.  Review of Systems:   Cardiac Risk Factors include: advanced age (>58men, >65 women)     Objective:     Vitals: BP 112/82 (BP Location: Left Arm, Patient Position: Sitting, Cuff Size: Normal)   Pulse (!) 55   Temp 98.1 F (36.7 C) (Oral)   Resp 16   Ht 5\' 6"  (1.676 m)   Wt 141 lb 1.6 oz (64 kg)   SpO2 98%   BMI 22.77 kg/m   Body mass index is 22.77 kg/m.  Advanced Directives 04/29/2019 04/22/2018 06/03/2017 02/27/2017 05/28/2016 10/15/2015 06/11/2015  Does Patient Have a Medical Advance Directive? Yes Yes No No;Yes Yes Yes Yes  Type of Advance Directive Living will;Healthcare Power of [place];Living will - [place];Living will [place];Living will [place];Living will [place];Living will  Does patient want to make changes to medical advance directive? - - - - No - Patient declined - No - Patient declined  Copy of [place] in Chart? No - copy requested No - copy requested - No - copy requested No - copy requested - No - copy requested    Tobacco Social History   Tobacco Use  Smoking Status Former Smoker  . Packs/day: 1.50  . Years: 15.00  . Pack years: 22.50  . Types: Cigarettes  . Start date: 11/17/1970  . Quit date: 11/17/1985  . Years since quitting: 33.4  Smokeless Tobacco Never Used  Tobacco Comment   smoking cessation materials not required     Counseling given: Not Answered Comment: smoking cessation materials not required   Clinical Intake:  Pre-visit preparation completed: Yes  Pain : No/denies pain     BMI - recorded: 22.77 Nutritional Status: BMI of 19-24  Normal Nutritional Risks: None Diabetes: No  How often do you need to have someone help you when you read instructions, pamphlets, or other written  materials from your doctor or pharmacy?: 1 - Never  Interpreter Needed?: No  Information entered by :: Clemetine Marker LPN  Past Medical History:  Diagnosis Date  . Cervical disc disease   . Cervical neuropathy   . Osteoporosis   . Tietze's disease    Past Surgical History:  Procedure Laterality Date  . COLONOSCOPY WITH PROPOFOL N/A 08/13/2015   Procedure: COLONOSCOPY WITH PROPOFOL;  Surgeon: Hulen Luster, MD;  Location[facility] END[facility]COPY;  Service: Gastroenterology;  Laterality: N/A;  . GUM SURGERY     Family History  Problem Relation Age of Onset  . Cancer Father        Colon   Social History   Socioeconomic History  . Marital status: Married    Spouse name: Denise Perez  . Number of children: 0  . Years of education: Not on file  . Highest education level: Bachelor's degree (e.g., BA, AB, BS)  Occupational History  . Occupation: Retired  Scientific laboratory technician  . Financial resource strain: Not hard at all  . Food insecurity    Worry: Never true    Inability: Never true  . Transportation needs    Medical: No    Non-medical: No  Tobacco Use  . Smoking status: Former Smoker    Packs/day: 1.50    Years: 15.00    Pack years: 22.50    Types: Cigarettes    Start date: 11/17/1970  Quit date: 11/17/1985    Years since quitting: 33.4  . Smokeless tobacco: Never Used  . Tobacco comment: smoking cessation materials not required  Substance and Sexual Activity  . Alcohol use: Yes    Alcohol/week: 0.0 standard drinks    Comment: 3 glasses of wine a year  . Drug use: No  . Sexual activity: Not Currently    Partners: Male  Lifestyle  . Physical activity    Days per week: 5 days    Minutes per session: 60 min  . Stress: Not at all  Relationships  . Social connections    Talks on phone: More than three times a week    Gets together: More than three times a week    Attends religious service: More than 4 times per year    Active member of club or organization: Yes    Attends meetings of clubs  or organizations: More than 4 times per year    Relationship status: Married  Other Topics Concern  . Not on file  Social History Narrative  . Not on file    Outpatient Encounter Medications as of 04/29/2019  Medication Sig  . Cholecalciferol (VITAMIN D) 2000 units CAPS Take 1 capsule (2,000 Units total) by mouth daily.   No facility-administered encounter medications on file as of 04/29/2019.     Activities of Daily Living In your present state of health, do you have any difficulty performing the following activities: 04/29/2019 12/17/2018  Hearing? N N  Comment declines hearing aids -  Vision? N N  Difficulty concentrating or making decisions? - N  Walking or climbing stairs? N N  Dressing or bathing? N N  Doing errands, shopping? N N  Preparing Food and eating ? N -  Using the Toilet? N -  In the past six months, have you accidently leaked urine? N -  Do you have problems with loss of bowel control? N -  Managing your Medications? N -  Managing your Finances? N -  Housekeeping or managing your Housekeeping? N -  Some recent data might be hidden    Patient Care Team: Steele Sizer, MD as PCP - General (Family Medicine) Thelma Comp, [place] (Optometry) Winfield Rast, [place] as Consulting Physician (Chiropractic Medicine)    Assessment:   This is a routine wellness examination for Denise Perez.  Exercise Activities and Dietary recommendations Current Exercise Habits: Home exercise routine, Type of exercise: walking;Other - see comments(swimming), Time (Minutes): 60, Frequency (Times/Week): 5, Weekly Exercise (Minutes/Week): 300, Intensity: Moderate, Exercise limited by: None identified  Goals    . DIET - INCREASE WATER INTAKE     Recommend to drink at least 6-8 8oz glasses of water per day.       Fall Risk Fall Risk  04/29/2019 12/17/2018 07/07/2018 05/13/2018 04/22/2018  Falls in the past year? 0 0 No No No  Number falls in past yr: 0 0 - - -  Injury with Fall? 0 0 - - -  Risk  for fall due to : - - - - History of fall(s);Impaired vision  Risk for fall due to: Comment - - - - wears eyeglasses  Follow up Falls prevention discussed - - - -   FALL RISK PREVENTION PERTAINING TO THE HOME:  Any stairs in or around the home? Yes  If so, do they handrails? Yes   Home free of loose throw rugs in walkways, pet beds, electrical cords, etc? Yes  Adequate lighting in your home to reduce risk  of falls? Yes   ASSISTIVE DEVICES UTILIZED TO PREVENT FALLS:  Life alert? No  Use of a cane, walker or w/c? No  Grab bars in the bathroom? No  Shower chair or bench in shower? No  Elevated toilet seat or a handicapped toilet? No   DME ORDERS:  DME order needed?  No   TIMED UP AND GO:  Was the test performed? Yes .  Length of time to ambulate 10 feet: 5 sec.   GAIT:  Appearance of gait: Gait stead-fast and without the use of an assistive device.  Education: Fall risk prevention has been discussed.  Intervention(s) required? No    Depression Screen PHQ 2/9 Scores 04/29/2019 12/17/2018 07/07/2018 05/13/2018  PHQ - 2 Score 0 0 0 0  PHQ- 9 Score - - 0 0     Cognitive Function     6CIT Screen 04/29/2019 04/22/2018 02/27/2017  What Year? 0 points 0 points 0 points  What month? 0 points 0 points 0 points  What time? 0 points 0 points 0 points  Count back from 20 0 points 0 points 0 points  Months in reverse 0 points 0 points 0 points  Repeat phrase 0 points 0 points 0 points  Total Score 0 0 0    Immunization History  Administered Date(s) Administered  . Influenza, High Dose Seasonal PF 08/14/2017, 08/24/2018  . Influenza-Unspecified 08/08/2014, 08/17/2017, 08/24/2018  . Pneumococcal Conjugate-13 04/22/2018  . Pneumococcal Polysaccharide-23 05/28/2016  . Tdap 03/08/2013  . Zoster 03/08/2013  . Zoster Recombinat (Shingrix) 06/28/2018, 11/12/2018    Qualifies for Shingles Vaccine? Yes  Shingrix series completed.   Tdap: Up to date  Flu Vaccine: Up to date   Pneumococcal Vaccine: Up to date   Screening Tests Health Maintenance  Topic Date Due  . MAMMOGRAM  05/18/2019  . INFLUENZA VACCINE  06/18/2019  . COLONOSCOPY  08/12/2020  . TETANUS/TDAP  03/09/2023  . DEXA SCAN  Completed  . Hepatitis C Screening  Completed  . PNA vac Low Risk Adult  Completed    Cancer Screenings:  Colorectal Screening: Completed 08/13/15. Repeat every 5 years.  Mammogram: Completed 05/17/18. Repeat every year. Ordered today. Pt provided with contact information and advised to call to schedule appt.   Bone Density: Completed 05/26/18. Results reflect  OSTEOPOROSIS. Repeat every 2 years.   Lung Cancer Screening: (Low Dose CT Chest recommended if Age 48-80 years, 30 pack-year currently smoking OR have quit w/in 15years.) does not qualify.    Additional Screening:  Hepatitis C Screening: does qualify; Completed 05/28/16  Vision Screening: Recommended annual ophthalmology exams for early detection of glaucoma and other disorders of the eye. Is the patient up to date with their annual eye exam?  Yes  Who is the provider or what is the name of the office in which the pt attends annual eye exams? [facility]  Dental Screening: Recommended annual dental exams for proper oral hygiene  Community Resource Referral:  CRR required this visit?  No      Plan:     I have personally reviewed and addressed the Medicare Annual Wellness questionnaire and have noted the following in the patient's chart:  A. Medical and social history B. Use of alcohol, tobacco or illicit drugs  C. Current medications and supplements D. Functional ability and status E.  Nutritional status F.  Physical activity G. Advance directives H. List of other physicians I.  Hospitalizations, surgeries, and ER visits in previous 12 months J.  Vitals  K. Screenings such as hearing and vision if needed, cognitive and depression L. Referrals and appointments   In addition, I have reviewed  and discussed with patient certain preventive protocols, quality metrics, and best practice recommendations. A written personalized care plan for preventive services as well as general preventive health recommendations were provided to patient.   Signed,  Clemetine Marker, LPN Nurse Health Advisor   Nurse Notes: pt doing well and appreciative of visit today. She has an annual scheduled for 05/16/19 and would like to discuss possible antibody testing for Covid-19 due to hx of cough, fever and fatigue in Jan 2020. Advised pt to discuss with Dr. Ancil Boozer.

## 2019-04-29 NOTE — Patient Instructions (Signed)
[name] , Thank you for taking time to come for your Medicare Wellness Visit. I appreciate your ongoing commitment to your health goals. Please review the following plan we discussed and let me know if I can assist you in the future.   Screening recommendations/referrals: Colonoscopy: done 08/13/15. Repeat in 2021. Mammogram: done 05/17/18. Please call [phone] to schedule your mammogram.  Bone Density: done 05/26/18. Repeat in 2021. Recommended yearly ophthalmology/optometry visit for glaucoma screening and checkup Recommended yearly dental visit for hygiene and checkup  Vaccinations: Influenza vaccine: done 08/24/18 Pneumococcal vaccine: done 04/22/18 Tdap vaccine: done 03/08/13 Shingles vaccine: done 11/12/18    Advanced directives: Please bring a copy of your health care power of attorney and living will to the office at your convenience.  Conditions/risks identified: Keep up the great work!  Next appointment: Please follow up in one year for your Medicare Annual Wellness visit.     Preventive Care 35 Years and Older, Female Preventive care refers to lifestyle choices and visits with your health care provider that can promote health and wellness. What does preventive care include?  A yearly physical exam. This is also called an annual well check.  Dental exams once or twice a year.  Routine eye exams. Ask your health care provider how often you should have your eyes checked.  Personal lifestyle choices, including:  Daily care of your teeth and gums.  Regular physical activity.  Eating a healthy diet.  Avoiding tobacco and drug use.  Limiting alcohol use.  Practicing safe sex.  Taking low-dose aspirin every day.  Taking vitamin and mineral supplements as recommended by your health care provider. What happens during an annual well check? The services and screenings done by your health care provider during your annual well check will depend on your age, overall  health, lifestyle risk factors, and family history of disease. Counseling  Your health care provider may ask you questions about your:  Alcohol use.  Tobacco use.  Drug use.  Emotional well-being.  Home and relationship well-being.  Sexual activity.  Eating habits.  History of falls.  Memory and ability to understand (cognition).  Work and work Statistician.  Reproductive health. Screening  You may have the following tests or measurements:  Height, weight, and BMI.  Blood pressure.  Lipid and cholesterol levels. These may be checked every 5 years, or more frequently if you are over 47 years old.  Skin check.  Lung cancer screening. You may have this screening every year starting at age 39 if you have a 30-pack-year history of smoking and currently smoke or have quit within the past 15 years.  Fecal occult blood test (FOBT) of the stool. You may have this test every year starting at age 19.  Flexible sigmoidoscopy or colonoscopy. You may have a sigmoidoscopy every 5 years or a colonoscopy every 10 years starting at age 30.  Hepatitis C blood test.  Hepatitis B blood test.  Sexually transmitted disease (STD) testing.  Diabetes screening. This is done by checking your blood sugar (glucose) after you have not eaten for a while (fasting). You may have this done every 1-3 years.  Bone density scan. This is done to screen for osteoporosis. You may have this done starting at age 54.  Mammogram. This may be done every 1-2 years. Talk to your health care provider about how often you should have regular mammograms. Talk with your health care provider about your test results, treatment options, and if necessary, the need  for more tests. Vaccines  Your health care provider may recommend certain vaccines, such as:  Influenza vaccine. This is recommended every year.  Tetanus, diphtheria, and acellular pertussis (Tdap, Td) vaccine. You may need a Td booster every 10 years.   Zoster vaccine. You may need this after age 23.  Pneumococcal 13-valent conjugate (PCV13) vaccine. One dose is recommended after age 58.  Pneumococcal polysaccharide (PPSV23) vaccine. One dose is recommended after age 25. Talk to your health care provider about which screenings and vaccines you need and how often you need them. This information is not intended to replace advice given to you by your health care provider. Make sure you discuss any questions you have with your health care provider. Document Released: 11/30/2015 Document Revised: 07/23/2016 Document Reviewed: 09/04/2015 Elsevier Interactive Patient Education  2017 [place] Prevention in the Home Falls can cause injuries. They can happen to people of all ages. There are many things you can do to make your home safe and to help prevent falls. What can I do on the outside of my home?  Regularly fix the edges of walkways and driveways and fix any cracks.  Remove anything that might make you trip as you walk through a door, such as a raised step or threshold.  Trim any bushes or trees on the path to your home.  Use bright outdoor lighting.  Clear any walking paths of anything that might make someone trip, such as rocks or tools.  Regularly check to see if handrails are loose or broken. Make sure that both sides of any steps have handrails.  Any raised decks and porches should have guardrails on the edges.  Have any leaves, snow, or ice cleared regularly.  Use sand or salt on walking paths during winter.  Clean up any spills in your garage right away. This includes oil or grease spills. What can I do in the bathroom?  Use night lights.  Install grab bars by the toilet and in the tub and shower. Do not use towel bars as grab bars.  Use non-skid mats or decals in the tub or shower.  If you need to sit down in the shower, use a plastic, non-slip stool.  Keep the floor dry. Clean up any water that spills  on the floor as soon as it happens.  Remove soap buildup in the tub or shower regularly.  Attach bath mats securely with double-sided non-slip rug tape.  Do not have throw rugs and other things on the floor that can make you trip. What can I do in the bedroom?  Use night lights.  Make sure that you have a light by your bed that is easy to reach.  Do not use any sheets or blankets that are too big for your bed. They should not hang down onto the floor.  Have a firm chair that has side arms. You can use this for support while you get dressed.  Do not have throw rugs and other things on the floor that can make you trip. What can I do in the kitchen?  Clean up any spills right away.  Avoid walking on wet floors.  Keep items that you use a lot in easy-to-reach places.  If you need to reach something above you, use a strong step stool that has a grab bar.  Keep electrical cords out of the way.  Do not use floor polish or wax that makes floors slippery. If you must use wax, use  non-skid floor wax.  Do not have throw rugs and other things on the floor that can make you trip. What can I do with my stairs?  Do not leave any items on the stairs.  Make sure that there are handrails on both sides of the stairs and use them. Fix handrails that are broken or loose. Make sure that handrails are as long as the stairways.  Check any carpeting to make sure that it is firmly attached to the stairs. Fix any carpet that is loose or worn.  Avoid having throw rugs at the top or bottom of the stairs. If you do have throw rugs, attach them to the floor with carpet tape.  Make sure that you have a light switch at the top of the stairs and the bottom of the stairs. If you do not have them, ask someone to add them for you. What else can I do to help prevent falls?  Wear shoes that:  Do not have high heels.  Have rubber bottoms.  Are comfortable and fit you well.  Are closed at the toe. Do  not wear sandals.  If you use a stepladder:  Make sure that it is fully opened. Do not climb a closed stepladder.  Make sure that both sides of the stepladder are locked into place.  Ask someone to hold it for you, if possible.  Clearly mark and make sure that you can see:  Any grab bars or handrails.  First and last steps.  Where the edge of each step is.  Use tools that help you move around (mobility aids) if they are needed. These include:  Canes.  Walkers.  Scooters.  Crutches.  Turn on the lights when you go into a dark area. Replace any light bulbs as soon as they burn out.  Set up your furniture so you have a clear path. Avoid moving your furniture around.  If any of your floors are uneven, fix them.  If there are any pets around you, be aware of where they are.  Review your medicines with your doctor. Some medicines can make you feel dizzy. This can increase your chance of falling. Ask your doctor what other things that you can do to help prevent falls. This information is not intended to replace advice given to you by your health care provider. Make sure you discuss any questions you have with your health care provider. Document Released: 08/30/2009 Document Revised: 04/10/2016 Document Reviewed: 12/08/2014 Elsevier Interactive Patient Education  2017 [employer].

## 2019-05-16 ENCOUNTER — Other Ambulatory Visit: Payer: Self-pay

## 2019-05-16 ENCOUNTER — Ambulatory Visit (INDEPENDENT_AMBULATORY_CARE_PROVIDER_SITE_OTHER): Payer: PPO | Admitting: Family Medicine

## 2019-05-16 ENCOUNTER — Encounter: Payer: Self-pay | Admitting: Family Medicine

## 2019-05-16 VITALS — BP 128/80 | HR 68 | Temp 96.8°F | Resp 16 | Ht 66.0 in | Wt 141.3 lb

## 2019-05-16 DIAGNOSIS — Z1239 Encounter for other screening for malignant neoplasm of breast: Secondary | ICD-10-CM | POA: Diagnosis not present

## 2019-05-16 DIAGNOSIS — M159 Polyosteoarthritis, unspecified: Secondary | ICD-10-CM

## 2019-05-16 DIAGNOSIS — R1012 Left upper quadrant pain: Secondary | ICD-10-CM | POA: Diagnosis not present

## 2019-05-16 DIAGNOSIS — L659 Nonscarring hair loss, unspecified: Secondary | ICD-10-CM | POA: Diagnosis not present

## 2019-05-16 DIAGNOSIS — E785 Hyperlipidemia, unspecified: Secondary | ICD-10-CM | POA: Diagnosis not present

## 2019-05-16 DIAGNOSIS — E559 Vitamin D deficiency, unspecified: Secondary | ICD-10-CM

## 2019-05-16 DIAGNOSIS — M810 Age-related osteoporosis without current pathological fracture: Secondary | ICD-10-CM | POA: Diagnosis not present

## 2019-05-16 DIAGNOSIS — Z01419 Encounter for gynecological examination (general) (routine) without abnormal findings: Secondary | ICD-10-CM | POA: Diagnosis not present

## 2019-05-16 DIAGNOSIS — M8949 Other hypertrophic osteoarthropathy, multiple sites: Secondary | ICD-10-CM

## 2019-05-16 DIAGNOSIS — M150 Primary generalized (osteo)arthritis: Secondary | ICD-10-CM

## 2019-05-16 NOTE — Patient Instructions (Signed)
Preventive Care 38 Years and Older, Female Preventive care refers to lifestyle choices and visits with your health care provider that can promote health and wellness. This includes:  A yearly physical exam. This is also called an annual well check.  Regular dental and eye exams.  Immunizations.  Screening for certain conditions.  Healthy lifestyle choices, such as diet and exercise. What can I expect for my preventive care visit? Physical exam Your health care provider will check:  Height and weight. These may be used to calculate body mass index (BMI), which is a measurement that tells if you are at a healthy weight.  Heart rate and blood pressure.  Your skin for abnormal spots. Counseling Your health care provider may ask you questions about:  Alcohol, tobacco, and drug use.  Emotional well-being.  Home and relationship well-being.  Sexual activity.  Eating habits.  History of falls.  Memory and ability to understand (cognition).  Work and work Statistician.  Pregnancy and menstrual history. What immunizations do I need?  Influenza (flu) vaccine  This is recommended every year. Tetanus, diphtheria, and pertussis (Tdap) vaccine  You may need a Td booster every 10 years. Varicella (chickenpox) vaccine  You may need this vaccine if you have not already been vaccinated. Zoster (shingles) vaccine  You may need this after age 33. Pneumococcal conjugate (PCV13) vaccine  One dose is recommended after age 33. Pneumococcal polysaccharide (PPSV23) vaccine  One dose is recommended after age 72. Measles, mumps, and rubella (MMR) vaccine  You may need at least one dose of MMR if you were born in 1957 or later. You may also need a second dose. Meningococcal conjugate (MenACWY) vaccine  You may need this if you have certain conditions. Hepatitis A vaccine  You may need this if you have certain conditions or if you travel or work in places where you may be exposed  to hepatitis A. Hepatitis B vaccine  You may need this if you have certain conditions or if you travel or work in places where you may be exposed to hepatitis B. Haemophilus influenzae type b (Hib) vaccine  You may need this if you have certain conditions. You may receive vaccines as individual doses or as more than one vaccine together in one shot (combination vaccines). Talk with your health care provider about the risks and benefits of combination vaccines. What tests do I need? Blood tests  Lipid and cholesterol levels. These may be checked every 5 years, or more frequently depending on your overall health.  Hepatitis C test.  Hepatitis B test. Screening  Lung cancer screening. You may have this screening every year starting at age 39 if you have a 30-pack-year history of smoking and currently smoke or have quit within the past 15 years.  Colorectal cancer screening. All adults should have this screening starting at age 36 and continuing until age 15. Your health care provider may recommend screening at age 23 if you are at increased risk. You will have tests every 1-10 years, depending on your results and the type of screening test.  Diabetes screening. This is done by checking your blood sugar (glucose) after you have not eaten for a while (fasting). You may have this done every 1-3 years.  Mammogram. This may be done every 1-2 years. Talk with your health care provider about how often you should have regular mammograms.  BRCA-related cancer screening. This may be done if you have a family history of breast, ovarian, tubal, or peritoneal cancers.  Other tests  Sexually transmitted disease (STD) testing.  Bone density scan. This is done to screen for osteoporosis. You may have this done starting at age 55. Follow these instructions at home: Eating and drinking  Eat a diet that includes fresh fruits and vegetables, whole grains, lean protein, and low-fat dairy products. Limit  your intake of foods with high amounts of sugar, saturated fats, and salt.  Take vitamin and mineral supplements as recommended by your health care provider.  Do not drink alcohol if your health care provider tells you not to drink.  If you drink alcohol: ? Limit how much you have to 0-1 drink a day. ? Be aware of how much alcohol is in your drink. In the [place] one drink equals one 12 oz bottle of beer (355 mL), one 5 oz glass of wine (148 mL), or one 1 oz glass of hard liquor (44 mL). Lifestyle  Take daily care of your teeth and gums.  Stay active. Exercise for at least 30 minutes on 5 or more days each week.  Do not use any products that contain nicotine or tobacco, such as cigarettes, e-cigarettes, and chewing tobacco. If you need help quitting, ask your health care provider.  If you are sexually active, practice safe sex. Use a condom or other form of protection in order to prevent STIs (sexually transmitted infections).  Talk with your health care provider about taking a low-dose aspirin or statin. What's next?  Go to your health care provider once a year for a well check visit.  Ask your health care provider how often you should have your eyes and teeth checked.  Stay up to date on all vaccines. This information is not intended to replace advice given to you by your health care provider. Make sure you discuss any questions you have with your health care provider. Document Released: 11/30/2015 Document Revised: 10/28/2018 Document Reviewed: 10/28/2018 Elsevier Patient Education  2020 [employer].

## 2019-05-16 NOTE — Progress Notes (Signed)
Name: Denise Perez   MRN: [id number]    DOB: 08/15/1951   Date:05/16/2019       Progress Note  Subjective  Chief Complaint  Chief Complaint  Patient presents with  . Annual Exam    HPI   Patient presents for annual CPE and follow up   Age related osteoporosis: explained that was slight progression of osteoporosis and discussed medications options, for now she prefers exercising, adding weight while walking, she is taking Vitamin D otc supplementation daily.  Dyslipidemia: she is only on life style modification   The 10-year ASCVD risk score Mikey Bussing DC Jr., et al., 2013) is: 7.5%   Values used to calculate the score:     Age: 68 years     Sex: Female     Is Non-Hispanic African American: No     Diabetic: No     Tobacco smoker: No     Systolic Blood Pressure: 409 mmHg     Is BP treated: No     HDL Cholesterol: 77 mg/dL     Total Cholesterol: 240 mg/dL  Diarrhea: she states she had increase in bowel frequency with soft stools after eating watermelon for lunch , she had diarrhea last year also, she is due for colonoscopy next year, no blood in stools, she states stressed about COVID-19 . Discussed going back sooner for colonoscopy She has also noticed some intermittent left upper quadrant pain , discomfort, since loose stools last week but not right now.   Hair loss: she noticed thinning on top of her scalp over the past severe years, she bought some biotin and we will check TSH today   Diet: healthy meals  Exercise: continue physical activity   USPSTF grade A and B recommendations    Office Visit from 05/16/2019 in [facility]  AUDIT-C Score  1     Depression: Phq 9 is  negative Depression screen [facility] 2/9 05/16/2019 04/29/2019 12/17/2018 07/07/2018 05/13/2018  Decreased Interest 0 0 0 0 0  Down, Depressed, Hopeless 0 0 0 0 0  PHQ - 2 Score 0 0 0 0 0  Altered sleeping 0 - - 0 0  Tired, decreased energy 1 - - 0 0  Change in appetite 0 - - 0 0  Feeling  bad or failure about yourself  0 - - 0 0  Trouble concentrating 0 - - 0 0  Moving slowly or fidgety/restless 0 - - 0 0  Suicidal thoughts 0 - - 0 0  PHQ-9 Score 1 - - 0 0  Difficult doing work/chores Not difficult at all - - Not difficult at all Not difficult at all   Hypertension: BP Readings from Last 3 Encounters:  05/16/19 128/80  04/29/19 112/82  12/17/18 118/72   Obesity: Wt Readings from Last 3 Encounters:  05/16/19 141 lb 4.8 oz (64.1 kg)  04/29/19 141 lb 1.6 oz (64 kg)  12/17/18 141 lb 3.2 oz (64 kg)   BMI Readings from Last 3 Encounters:  05/16/19 22.81 kg/m  04/29/19 22.77 kg/m  12/17/18 22.79 kg/m    Hep C Screening: up to date  STD testing and prevention (HIV/chl/gon/syphilis): N/A Intimate partner violence: negative  Sexual History/Pain during Intercourse: not sexually active for many years Menstrual History/LMP/Abnormal Bleeding: post-menopausal and discussed post-menopausal bleeding  Incontinence Symptoms: she has been wearing a panty liner, occasionally dribble when she sneezes, discussed kegel exercise   Advanced Care Planning: A voluntary discussion about advance care planning including the explanation  and discussion of advance directives.  Discussed health care proxy and Living will, and the patient was able to identify a health care proxy as husband  .  Patient does have a living will at present time.  Breast cancer:  HM Mammogram  Date Value Ref Range Status  11/28/2015 Bi-Rads Category 1: Negative, Done at [facility]  Final    BRCA gene screening: father had colon cancer, she is getting it every 5 years, normal previous colonoscopies Cervical cancer screening: no indication   Osteoporosis Screening: 05/2018   Lipids:  Lab Results  Component Value Date   CHOL 240 (H) 05/13/2018   CHOL 211 (H) 05/28/2016   Lab Results  Component Value Date   HDL 77 05/13/2018   HDL 85 05/28/2016   Lab Results  Component Value Date   LDLCALC 145 (H)  05/13/2018   LDLCALC 113 05/28/2016   Lab Results  Component Value Date   TRIG 74 05/13/2018   TRIG 66 05/28/2016   Lab Results  Component Value Date   CHOLHDL 3.1 05/13/2018   CHOLHDL 2.5 05/28/2016   No results found for: LDLDIRECT  Glucose:  Glucose, Bld  Date Value Ref Range Status  05/13/2018 77 65 - 99 mg/dL Final    Comment:    .            Fasting reference interval .   05/28/2016 83 65 - 99 mg/dL Final    Skin cancer: discussed atypical lesions Colorectal cancer: up to date   Lung cancer:  Low Dose CT Chest recommended if Age 45-80 years, 30 pack-year currently smoking OR have quit w/in 15years. Patient does not qualify.   ECG:04/2018   Patient Active Problem List   Diagnosis Date Noted  . Dyslipidemia 05/28/2016  . Osteoporosis 05/28/2016  . Cervical disc disease 05/28/2016  . Primary osteoarthritis involving multiple joints 06/11/2015  . Synovial cyst of wrist 06/11/2015    Past Surgical History:  Procedure Laterality Date  . COLONOSCOPY WITH PROPOFOL N/A 08/13/2015   Procedure: COLONOSCOPY WITH PROPOFOL;  Surgeon: Hulen Luster, MD;  Location: [facility] ENDOSCOPY;  Service: Gastroenterology;  Laterality: N/A;  . GUM SURGERY      Family History  Problem Relation Age of Onset  . Cancer Father        Colon    Social History   Socioeconomic History  . Marital status: Married    Spouse name: Glendell Docker  . Number of children: 0  . Years of education: Not on file  . Highest education level: Bachelor's degree (e.g., BA, AB, BS)  Occupational History  . Occupation: Retired  Scientific laboratory technician  . Financial resource strain: Not hard at all  . Food insecurity    Worry: Never true    Inability: Never true  . Transportation needs    Medical: No    Non-medical: No  Tobacco Use  . Smoking status: Former Smoker    Packs/day: 1.50    Years: 15.00    Pack years: 22.50    Types: Cigarettes    Start date: 11/17/1970    Quit date: 11/17/1985    Years since quitting: 33.5   . Smokeless tobacco: Never Used  . Tobacco comment: smoking cessation materials not required  Substance and Sexual Activity  . Alcohol use: Yes    Alcohol/week: 0.0 standard drinks    Comment: 5 glasses of wine a year  . Drug use: No  . Sexual activity: Not Currently    Partners: Male  Lifestyle  . Physical activity    Days per week: 7 days    Minutes per session: 40 min  . Stress: Not at all  Relationships  . Social connections    Talks on phone: More than three times a week    Gets together: More than three times a week    Attends religious service: More than 4 times per year    Active member of club or organization: Yes    Attends meetings of clubs or organizations: More than 4 times per year    Relationship status: Married  . Intimate partner violence    Fear of current or ex partner: No    Emotionally abused: No    Physically abused: No    Forced sexual activity: No  Other Topics Concern  . Not on file  Social History Narrative  . Not on file     Current Outpatient Medications:  .  Cholecalciferol (VITAMIN D) 2000 units CAPS, Take 1 capsule (2,000 Units total) by mouth daily., Disp: 30 capsule, Rfl: 0  Allergies  Allergen Reactions  . Prednisone Other (See Comments)    Shaking     ROS  Constitutional: Negative for fever or weight change.  Respiratory: Negative for cough and shortness of breath.   Cardiovascular: Negative for chest pain or palpitations.  Gastrointestinal: positive  for abdominal pain, positive for  bowel changes.  Musculoskeletal: Negative for gait problem or joint swelling.  Skin: Negative for rash.  Neurological: Negative for dizziness or headache.  No other specific complaints in a complete review of systems (except as listed in HPI above).  Objective  Vitals:   05/16/19 0900  BP: 128/80  Pulse: 68  Resp: 16  Temp: (!) 96.8 F (36 C)  TempSrc: Temporal  SpO2: 98%  Weight: 141 lb 4.8 oz (64.1 kg)  Height: '5\' 6"'  (1.676 m)     Body mass index is 22.81 kg/m.  Physical Exam  Constitutional: Patient appears well-developed and well-nourished. No distress.  HENT: Head: Normocephalic and atraumatic. Ears: B TMs ok, no erythema or effusion; Nose: Nose normal. Mouth/Throat: Oropharynx is clear and moist. No oropharyngeal exudate.  Eyes: Conjunctivae and EOM are normal. Pupils are equal, round, and reactive to light. No scleral icterus.  Neck: Normal range of motion. Neck supple. No JVD present. No thyromegaly present.  Cardiovascular: Normal rate, regular rhythm and normal heart sounds.  No murmur heard. No BLE edema. Pulmonary/Chest: Effort normal and breath sounds normal. No respiratory distress. Abdominal: Soft. Bowel sounds are normal, no distension. There is no tenderness. no masses Breast: no lumps or masses, no nipple discharge or rashes FEMALE GENITALIA:  Not done  RECTAL: not done  Musculoskeletal: Normal range of motion, no joint effusions. No gross deformities Neurological: he is alert and oriented to person, place, and time. No cranial nerve deficit. Coordination, balance, strength, speech and gait are normal.  Skin: Skin is warm and dry. No rash noted. No erythema.  Psychiatric: Patient has a normal mood and affect. behavior is normal. Judgment and thought content normal.  PHQ2/9: Depression screen [facility] 2/9 05/16/2019 04/29/2019 12/17/2018 07/07/2018 05/13/2018  Decreased Interest 0 0 0 0 0  Down, Depressed, Hopeless 0 0 0 0 0  PHQ - 2 Score 0 0 0 0 0  Altered sleeping 0 - - 0 0  Tired, decreased energy 1 - - 0 0  Change in appetite 0 - - 0 0  Feeling bad or failure about yourself  0 - -  0 0  Trouble concentrating 0 - - 0 0  Moving slowly or fidgety/restless 0 - - 0 0  Suicidal thoughts 0 - - 0 0  PHQ-9 Score 1 - - 0 0  Difficult doing work/chores Not difficult at all - - Not difficult at all Not difficult at all     Fall Risk: Fall Risk  05/16/2019 05/16/2019 04/29/2019 12/17/2018 07/07/2018  Falls  in the past year? 0 0 0 0 No  Number falls in past yr: 0 0 0 0 -  Injury with Fall? 0 0 0 0 -  Risk for fall due to : - - - - -  Risk for fall due to: Comment - - - - -  Follow up - - Falls prevention discussed - -     Functional Status Survey: Is the patient deaf or have difficulty hearing?: No Does the patient have difficulty seeing, even when wearing glasses/contacts?: No Does the patient have difficulty concentrating, remembering, or making decisions?: No Does the patient have difficulty walking or climbing stairs?: No Does the patient have difficulty dressing or bathing?: No Does the patient have difficulty doing errands alone such as visiting a doctor's office or shopping?: No   Assessment & Plan  1. Breast cancer screening  - MM 3D SCREEN BREAST BILATERAL  2. Dyslipidemia  - Lipid panel  3. Primary osteoarthritis involving multiple joints  Doing well, no pain at this time  4. Age-related osteoporosis without current pathological fracture  - COMPLETE METABOLIC PANEL WITH GFR - CBC with Differential/Platelet  5. Well woman exam   6. Hair loss  - TSH  7. Vitamin D deficiency  - VITAMIN D 25 Hydroxy (Vit-D Deficiency, Fractures)   8. Left upper quadrant pain  Normal exam, pain intermittent and not at this time, advised to monitor, also to monitor for bowel changes again and if needed refer back to GI  -USPSTF grade A and B recommendations reviewed with patient; age-appropriate recommendations, preventive care, screening tests, etc discussed and encouraged; healthy living encouraged; see AVS for patient education given to patient -Discussed importance of 150 minutes of physical activity weekly, eat two servings of fish weekly, eat one serving of tree nuts ( cashews, pistachios, pecans, almonds.Marland Kitchen) every other day, eat 6 servings of fruit/vegetables daily and drink plenty of water and avoid sweet beverages.

## 2019-05-17 LAB — COMPLETE METABOLIC PANEL WITH GFR
AG Ratio: 1.7 (calc) (ref 1.0–2.5)
ALT: 11 U/L (ref 6–29)
AST: 17 U/L (ref 10–35)
Albumin: 4 g/dL (ref 3.6–5.1)
Alkaline phosphatase (APISO): 82 U/L (ref 37–153)
BUN: 12 mg/dL (ref 7–25)
CO2: 27 mmol/L (ref 20–32)
Calcium: 9 mg/dL (ref 8.6–10.4)
Chloride: 108 mmol/L (ref 98–110)
Creat: 0.86 mg/dL (ref 0.50–0.99)
GFR, Est African American: 80 mL/min/{1.73_m2} (ref >=60)
GFR, Est Non African American: 69 mL/min/{1.73_m2} (ref >=60)
Globulin: 2.4 g/dL (calc) (ref 1.9–3.7)
Glucose, Bld: 89 mg/dL (ref 65–99)
Potassium: 3.9 mmol/L (ref 3.5–5.3)
Sodium: 142 mmol/L (ref 135–146)
Total Bilirubin: 0.6 mg/dL (ref 0.2–1.2)
Total Protein: 6.4 g/dL (ref 6.1–8.1)

## 2019-05-17 LAB — CBC WITH DIFFERENTIAL/PLATELET
Absolute Monocytes: 418 cells/uL (ref 200–950)
Basophils Absolute: 41 cells/uL (ref 0–200)
Basophils Relative: 0.7 %
Eosinophils Absolute: 58 cells/uL (ref 15–500)
Eosinophils Relative: 1 %
HCT: 43.2 % (ref 35.0–45.0)
Hemoglobin: 14.5 g/dL (ref 11.7–15.5)
Lymphs Abs: 1769 cells/uL (ref 850–3900)
MCH: 31.8 pg (ref 27.0–33.0)
MCHC: 33.6 g/dL (ref 32.0–36.0)
MCV: 94.7 fL (ref 80.0–100.0)
MPV: 10.1 fL (ref 7.5–12.5)
Monocytes Relative: 7.2 %
Neutro Abs: 3515 cells/uL (ref 1500–7800)
Neutrophils Relative %: 60.6 %
Platelets: 217 10*3/uL (ref 140–400)
RBC: 4.56 10*6/uL (ref 3.80–5.10)
RDW: 13.4 % (ref 11.0–15.0)
Total Lymphocyte: 30.5 %
WBC: 5.8 10*3/uL (ref 3.8–10.8)

## 2019-05-17 LAB — VITAMIN D 25 HYDROXY (VIT D DEFICIENCY, FRACTURES): Vit D, 25-Hydroxy: 37 ng/mL (ref 30–100)

## 2019-05-17 LAB — LIPID PANEL
Cholesterol: 230 mg/dL — ABNORMAL HIGH (ref <200)
HDL: 74 mg/dL (ref >=50)
LDL Cholesterol (Calc): 138 mg/dL (calc) — ABNORMAL HIGH
Non-HDL Cholesterol (Calc): 156 mg/dL (calc) — ABNORMAL HIGH (ref <130)
Total CHOL/HDL Ratio: 3.1 (calc) (ref <5.0)
Triglycerides: 84 mg/dL (ref <150)

## 2019-05-17 LAB — TSH: TSH: 0.86 mIU/L (ref 0.40–4.50)

## 2019-05-18 ENCOUNTER — Ambulatory Visit: Payer: Self-pay

## 2019-05-18 NOTE — Telephone Encounter (Signed)
Provieded Lab results of Dr. Ruthine Dose.  Of 05/17/19.  Patient voices understandings.

## 2019-05-24 ENCOUNTER — Other Ambulatory Visit: Payer: Self-pay | Admitting: Family Medicine

## 2019-05-24 ENCOUNTER — Telehealth: Payer: Self-pay

## 2019-05-24 DIAGNOSIS — R198 Other specified symptoms and signs involving the digestive system and abdomen: Secondary | ICD-10-CM

## 2019-05-24 DIAGNOSIS — R1012 Left upper quadrant pain: Secondary | ICD-10-CM

## 2019-05-24 DIAGNOSIS — R1032 Left lower quadrant pain: Secondary | ICD-10-CM

## 2019-05-24 NOTE — Telephone Encounter (Signed)
Patient notified about her referral and yes she would still like to go to [place]. She states Dr. Candace Cruise has left or retired but she does like Dr. Vira Agar. Patient also verbalized understanding to pick up script for stool sample and take it to a [facility] site.

## 2019-05-24 NOTE — Telephone Encounter (Signed)
Copied from [place] [id number]. Topic: General - Other >> May 24, 2019  2:13 PM Rainey Pines A wrote: Patient would like a callback from Dr. Ancil Boozer nurse in regards to the left abdominal pain that she is still having since her last appt 05/13/2019

## 2019-05-25 NOTE — Telephone Encounter (Signed)
Referral will be sent to [facility] attn Dr. Vira Agar

## 2019-05-26 DIAGNOSIS — R1032 Left lower quadrant pain: Secondary | ICD-10-CM | POA: Diagnosis not present

## 2019-05-26 DIAGNOSIS — R198 Other specified symptoms and signs involving the digestive system and abdomen: Secondary | ICD-10-CM | POA: Diagnosis not present

## 2019-05-30 ENCOUNTER — Other Ambulatory Visit: Payer: Self-pay | Admitting: Nurse Practitioner

## 2019-05-30 DIAGNOSIS — R194 Change in bowel habit: Secondary | ICD-10-CM | POA: Diagnosis not present

## 2019-05-30 DIAGNOSIS — R1033 Periumbilical pain: Secondary | ICD-10-CM | POA: Insufficient documentation

## 2019-05-30 DIAGNOSIS — Z800 Family history of malignant neoplasm of digestive organs: Secondary | ICD-10-CM | POA: Diagnosis not present

## 2019-05-30 LAB — GI PROFILE, STOOL, PCR

## 2019-05-31 ENCOUNTER — Other Ambulatory Visit: Payer: Self-pay

## 2019-05-31 ENCOUNTER — Telehealth: Payer: Self-pay | Admitting: Family Medicine

## 2019-05-31 ENCOUNTER — Ambulatory Visit
Admission: RE | Admit: 2019-05-31 | Discharge: 2019-05-31 | Disposition: A | Discharge status: Home / Self Care | Payer: PPO | Source: Ambulatory Visit | Attending: Family Medicine | Admitting: Family Medicine

## 2019-05-31 DIAGNOSIS — Z1231 Encounter for screening mammogram for malignant neoplasm of breast: Secondary | ICD-10-CM | POA: Insufficient documentation

## 2019-05-31 NOTE — Telephone Encounter (Signed)
Pt did see gi specialist yesterday. Pt is still having abd pain to the left of her bellybutton. Pt is schedule for colonoscopy, endoscopy and ct abd/pelvis test. Pt has stool sample results and she would like to know if the tests that gi specialist order are necessary. Pt would like to know how long does e coli last. Please advise

## 2019-06-06 ENCOUNTER — Ambulatory Visit
Admission: RE | Admit: 2019-06-06 | Discharge: 2019-06-06 | Disposition: A | Discharge status: Home / Self Care | Payer: PPO | Source: Ambulatory Visit | Attending: Nurse Practitioner | Admitting: Nurse Practitioner

## 2019-06-06 ENCOUNTER — Other Ambulatory Visit: Payer: Self-pay

## 2019-06-06 DIAGNOSIS — R197 Diarrhea, unspecified: Secondary | ICD-10-CM | POA: Diagnosis not present

## 2019-06-06 DIAGNOSIS — R1033 Periumbilical pain: Secondary | ICD-10-CM

## 2019-06-06 MED ORDER — IOHEXOL 300 MG/ML  SOLN
80.0000 mL | Freq: Once | INTRAMUSCULAR | Status: AC | PRN
  Administered 2019-06-06: 80 mL via INTRAVENOUS

## 2019-06-24 ENCOUNTER — Other Ambulatory Visit: Payer: Self-pay

## 2019-06-24 ENCOUNTER — Other Ambulatory Visit
Admission: RE | Admit: 2019-06-24 | Discharge: 2019-06-24 | Disposition: A | Discharge status: Home / Self Care | Payer: PPO | Source: Ambulatory Visit | Attending: Internal Medicine | Admitting: Internal Medicine

## 2019-06-24 DIAGNOSIS — Z20828 Contact with and (suspected) exposure to other viral communicable diseases: Secondary | ICD-10-CM | POA: Diagnosis not present

## 2019-06-24 DIAGNOSIS — Z01812 Encounter for preprocedural laboratory examination: Secondary | ICD-10-CM | POA: Diagnosis not present

## 2019-06-25 LAB — SARS CORONAVIRUS 2 (TAT 6-24 HRS): SARS Coronavirus 2: NEGATIVE

## 2019-06-28 ENCOUNTER — Encounter: Payer: Self-pay | Admitting: *Deleted

## 2019-06-29 ENCOUNTER — Encounter: Payer: Self-pay | Admitting: *Deleted

## 2019-06-29 ENCOUNTER — Encounter
Admission: RE | Disposition: A | Discharge status: Home / Self Care | Payer: Self-pay | Source: Home / Self Care | Attending: Internal Medicine

## 2019-06-29 ENCOUNTER — Other Ambulatory Visit: Payer: Self-pay

## 2019-06-29 ENCOUNTER — Ambulatory Visit: Payer: PPO | Admitting: Certified Registered"

## 2019-06-29 ENCOUNTER — Ambulatory Visit
Admission: RE | Admit: 2019-06-29 | Discharge: 2019-06-29 | Disposition: A | Discharge status: Home / Self Care | Payer: PPO | Attending: Internal Medicine | Admitting: Internal Medicine

## 2019-06-29 DIAGNOSIS — K2970 Gastritis, unspecified, without bleeding: Secondary | ICD-10-CM | POA: Insufficient documentation

## 2019-06-29 DIAGNOSIS — R1033 Periumbilical pain: Secondary | ICD-10-CM | POA: Insufficient documentation

## 2019-06-29 DIAGNOSIS — K219 Gastro-esophageal reflux disease without esophagitis: Secondary | ICD-10-CM | POA: Insufficient documentation

## 2019-06-29 DIAGNOSIS — K640 First degree hemorrhoids: Secondary | ICD-10-CM | POA: Insufficient documentation

## 2019-06-29 DIAGNOSIS — K3189 Other diseases of stomach and duodenum: Secondary | ICD-10-CM | POA: Diagnosis not present

## 2019-06-29 DIAGNOSIS — K2960 Other gastritis without bleeding: Secondary | ICD-10-CM | POA: Diagnosis not present

## 2019-06-29 DIAGNOSIS — Z800 Family history of malignant neoplasm of digestive organs: Secondary | ICD-10-CM | POA: Insufficient documentation

## 2019-06-29 DIAGNOSIS — Z87891 Personal history of nicotine dependence: Secondary | ICD-10-CM | POA: Insufficient documentation

## 2019-06-29 DIAGNOSIS — Z1211 Encounter for screening for malignant neoplasm of colon: Secondary | ICD-10-CM | POA: Diagnosis not present

## 2019-06-29 DIAGNOSIS — Z79899 Other long term (current) drug therapy: Secondary | ICD-10-CM | POA: Insufficient documentation

## 2019-06-29 DIAGNOSIS — Z888 Allergy status to other drugs, medicaments and biological substances status: Secondary | ICD-10-CM | POA: Insufficient documentation

## 2019-06-29 DIAGNOSIS — K2990 Gastroduodenitis, unspecified, without bleeding: Secondary | ICD-10-CM | POA: Diagnosis not present

## 2019-06-29 DIAGNOSIS — K2980 Duodenitis without bleeding: Secondary | ICD-10-CM | POA: Diagnosis not present

## 2019-06-29 HISTORY — DX: Gastro-esophageal reflux disease without esophagitis: K21.9

## 2019-06-29 HISTORY — PX: ESOPHAGOGASTRODUODENOSCOPY (EGD) WITH PROPOFOL: SHX5813

## 2019-06-29 HISTORY — PX: COLONOSCOPY WITH PROPOFOL: SHX5780

## 2019-06-29 LAB — COLOGUARD: Cologuard: NEGATIVE

## 2019-06-29 SURGERY — ESOPHAGOGASTRODUODENOSCOPY (EGD) WITH PROPOFOL
Anesthesia: General

## 2019-06-29 MED ORDER — PROPOFOL 10 MG/ML IV BOLUS
INTRAVENOUS | Status: DC | PRN
  Administered 2019-06-29 (×6): 50 mg via INTRAVENOUS

## 2019-06-29 MED ORDER — LIDOCAINE HCL (CARDIAC) PF 100 MG/5ML IV SOSY
PREFILLED_SYRINGE | INTRAVENOUS | Status: DC | PRN
  Administered 2019-06-29: 100 mg via INTRATRACHEAL

## 2019-06-29 MED ORDER — SODIUM CHLORIDE 0.9 % IV SOLN
INTRAVENOUS | Status: DC
  Administered 2019-06-29: 10:00:00 via INTRAVENOUS

## 2019-06-29 MED ORDER — GLYCOPYRROLATE 0.2 MG/ML IJ SOLN
INTRAMUSCULAR | Status: DC | PRN
  Administered 2019-06-29: 0.2 mg via INTRAVENOUS

## 2019-06-29 NOTE — Anesthesia Postprocedure Evaluation (Signed)
Anesthesia Post Note  Patient: IZABEL CHIM Tine  Procedure(s) Performed: ESOPHAGOGASTRODUODENOSCOPY (EGD) WITH PROPOFOL (N/A ) COLONOSCOPY WITH PROPOFOL (N/A )  Patient location during evaluation: Endoscopy Anesthesia Type: General Level of consciousness: awake and alert and oriented Pain management: pain level controlled Vital Signs Assessment: post-procedure vital signs reviewed and stable Respiratory status: spontaneous breathing, nonlabored ventilation and respiratory function stable Cardiovascular status: blood pressure returned to baseline and stable Postop Assessment: no signs of nausea or vomiting Anesthetic complications: no     Last Vitals:  Vitals:   06/29/19 1111 06/29/19 1131  BP: 100/73 113/73  Pulse:    Resp:    Temp:    SpO2:      Last Pain:  Vitals:   06/29/19 1101  TempSrc: Tympanic  PainSc:                  [place] Code

## 2019-06-29 NOTE — Anesthesia Post-op Follow-up Note (Signed)
Anesthesia QCDR form completed.        

## 2019-06-29 NOTE — H&P (Signed)
Outpatient short stay form Pre-procedure 06/29/2019 10:20 AM Denise Perez, M.D.  Primary Physician: Denise Perez, M.D.  Reason for visit:  Family hx of colon cancer, periumblical pain  History of present illness: As above.. Pt has occasional loose stools, but celiac ab and stool studies negative. Has some postprandial abdominal pain. No melena. Currently on Pepcid with some improvement.  Colonoscopy in 2011 was "normal".    Current Facility-Administered Medications:  .  0.9 %  sodium chloride infusion, , Intravenous, Continuous, Denise, Benay Pike, MD, Last Rate: 20 mL/hr at 06/29/19 6808  Medications Prior to Admission  Medication Sig Dispense Refill Last Dose  . Cholecalciferol (VITAMIN D) 2000 units CAPS Take 1 capsule (2,000 Units total) by mouth daily. 30 capsule 0 Past Week at Unknown time  . famotidine (PEPCID) 20 MG tablet Take 20 mg by mouth 2 (two) times daily.   Past Week at Unknown time     Allergies  Allergen Reactions  . Prednisone Other (See Comments)    Shaking     Past Medical History:  Diagnosis Date  . Cervical disc disease   . Cervical neuropathy   . GERD (gastroesophageal reflux disease)   . Osteoporosis   . Tietze's disease     Review of systems:  Otherwise negative.    Physical Exam  Gen: Alert, oriented. Appears stated age.  HEENT: [place]/AT. PERRLA. Lungs: CTA, no wheezes. CV: RR nl S1, S2. Abd: soft, benign, no masses. BS+ Ext: No edema. Pulses 2+    Planned procedures: Proceed with EGD and colonoscopy. The patient understands the nature of the planned procedure, indications, risks, alternatives and potential complications including but not limited to bleeding, infection, perforation, damage to internal organs and possible oversedation/side effects from anesthesia. The patient agrees and gives consent to proceed.  Please refer to procedure notes for findings, recommendations and patient disposition/instructions.     Denise Perez, M.D. Gastroenterology 06/29/2019  10:20 AM

## 2019-06-29 NOTE — Op Note (Addendum)
[facility] Gastroenterology Patient Name: Denise Perez Procedure Date: 06/29/2019 10:22 AM MRN: [id number] Account #: [id number] Date of Birth: May 27, 1951 Admit Type: Outpatient Age: 68 Room: [facility] ENDO ROOM 3 Gender: Female Note Status: Finalized Procedure:            Upper GI endoscopy Indications:          Periumbilical abdominal pain Providers:            Benay Pike. Jaydin Reichert MD, MD Referring MD:         Bethena Roys. Sowles, MD (Referring MD) Medicines:            Propofol per Anesthesia Complications:        No immediate complications. Procedure:            Pre-Anesthesia Assessment:                       - The risks and benefits of the procedure and the                        sedation options and risks were discussed with the                        patient. All questions were answered and informed                        consent was obtained.                       - Patient identification and proposed procedure were                        verified prior to the procedure by the nurse. The                        procedure was verified in the procedure room.                       - ASA Grade Assessment: III - A patient with severe                        systemic disease.                       - After reviewing the risks and benefits, the patient                        was deemed in satisfactory condition to undergo the                        procedure.                       After obtaining informed consent, the endoscope was                        passed under direct vision. Throughout the procedure,                        the patient's blood pressure, pulse, and oxygen  saturations were monitored continuously. The Endoscope                        was introduced through the mouth, and advanced to the                        third part of duodenum. The upper GI endoscopy was                        accomplished without difficulty. The patient  tolerated                        the procedure well. Findings:      The examined esophagus was normal.      Localized mild inflammation characterized by erythema was found in the       gastric antrum. Biopsies were taken with a cold forceps for Helicobacter       pylori testing.      The cardia and gastric fundus were normal on retroflexion.      Localized mildly erythematous mucosa without active bleeding and with no       stigmata of bleeding was found in the duodenal bulb.      The second portion of the duodenum and third portion of the duodenum       were normal.      The exam was otherwise without abnormality. Impression:           - Normal esophagus.                       - Gastritis. Biopsied.                       - Erythematous duodenopathy.                       - Normal second portion of the duodenum and third                        portion of the duodenum.                       - The examination was otherwise normal. Recommendation:       - Await pathology results.                       - Proceed with colonoscopy Procedure Code(s):    --- Professional ---                       [phone], Esophagogastroduodenoscopy, flexible, transoral;                        with biopsy, single or multiple Diagnosis Code(s):    --- Professional ---                       Z30.86, Periumbilical pain                       K31.89, Other diseases of stomach and duodenum                       K29.70, Gastritis, unspecified, without bleeding CPT copyright 2019 American Medical  Association. All rights reserved. The codes documented in this report are preliminary and upon coder review may  be revised to meet current compliance requirements. Efrain Sella MD, MD 06/29/2019 11:02:23 AM This report has been signed electronically. Number of Addenda: 0 Note Initiated On: 06/29/2019 10:22 AM Scope Withdrawal Time: 0 hours 6 minutes 10 seconds  Total Procedure Duration: 0 hours 10 minutes 21 seconds   Estimated Blood Loss: Estimated blood loss: none.      [facility]

## 2019-06-29 NOTE — Transfer of Care (Signed)
Immediate Anesthesia Transfer of Care Note  Patient: Denise Perez  Procedure(s) Performed: ESOPHAGOGASTRODUODENOSCOPY (EGD) WITH PROPOFOL (N/A ) COLONOSCOPY WITH PROPOFOL (N/A )  Patient Location: Endoscopy Unit  Anesthesia Type:General  Level of Consciousness: drowsy and responds to stimulation  Airway & Oxygen Therapy: Patient Spontanous Breathing and Patient connected to face mask oxygen  Post-op Assessment: Report given to RN and Post -op Vital signs reviewed and stable  Post vital signs: Reviewed and stable  Last Vitals:  Vitals Value Taken Time  BP 79/56 06/29/19 1059  Temp    Pulse 100 06/29/19 1059  Resp 16 06/29/19 1059  SpO2 100 % 06/29/19 1059  Vitals shown include unvalidated device data.  Last Pain:  Vitals:   06/29/19 0920  TempSrc: Tympanic  PainSc: 0-No pain         Complications: No apparent anesthesia complications

## 2019-06-29 NOTE — Anesthesia Preprocedure Evaluation (Signed)
Anesthesia Evaluation  Patient identified by MRN, date of birth, ID band Patient awake    Reviewed: Allergy & Precautions, NPO status , Patient's Chart, lab work & pertinent test results  History of Anesthesia Complications Negative for: history of anesthetic complications  Airway Mallampati: II  TM Distance: >3 FB Neck ROM: Full    Dental  (+) Partial Upper   Pulmonary neg pulmonary ROS, neg sleep apnea, neg COPD, former smoker,    breath sounds clear to auscultation- rhonchi (-) wheezing      Cardiovascular Exercise Tolerance: Good (-) hypertension(-) CAD, (-) Past MI, (-) Cardiac Stents and (-) CABG  Rhythm:Regular Rate:Normal - Systolic murmurs and - Diastolic murmurs    Neuro/Psych neg Seizures negative neurological ROS  negative psych ROS   GI/Hepatic Neg liver ROS, GERD  ,  Endo/Other  negative endocrine ROSneg diabetes  Renal/GU negative Renal ROS     Musculoskeletal  (+) Arthritis ,   Abdominal (+) - obese,   Peds  Hematology negative hematology ROS (+)   Anesthesia Other Findings Past Medical History: No date: Cervical disc disease No date: Cervical neuropathy No date: GERD (gastroesophageal reflux disease) No date: Osteoporosis 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   Reproductive/Obstetrics                             Anesthesia Physical Anesthesia Plan  ASA: II  Anesthesia Plan: General   Post-op Pain Management:    Induction: Intravenous  PONV Risk Score and Plan: 2 and Propofol infusion  Airway Management Planned: Natural Airway  Additional Equipment:   Intra-op Plan:   Post-operative Plan:   Informed Consent: I have reviewed the patients History and Physical, chart, labs and discussed the procedure including the risks, benefits and alternatives for the proposed anesthesia with the patient or authorized representative who has indicated his/her understanding and  acceptance.     Dental advisory given  Plan Discussed with: CRNA and Anesthesiologist  Anesthesia Plan Comments:         Anesthesia Quick Evaluation

## 2019-06-29 NOTE — Op Note (Addendum)
[facility]astroenterology Patient Name: Denise Perez Procedure Date: 06/29/2019 10:21 AM MRN: [id number] Account #: [id number] Date of Birth: 01/22/1951 Admit Type: Outpatient Age: 68 Room: [facility] ENDO ROOM 3 Gender: Female Note Status: Finalized Procedure:            Colonoscopy Indications:          Screening in patient at increased risk: Family history                        of 1st-degree relative with colorectal cancer Providers:            Benay Pike. [place] MD, MD Medicines:            Propofol per Anesthesia Complications:        No immediate complications. Procedure:            Pre-Anesthesia Assessment:                       - The risks and benefits of the procedure and the                        sedation options and risks were discussed with the                        patient. All questions were answered and informed                        consent was obtained.                       - Patient identification and proposed procedure were                        verified prior to the procedure by the nurse. The                        procedure was verified in the procedure room.                       - ASA Grade Assessment: III - A patient with severe                        systemic disease.                       - After reviewing the risks and benefits, the patient                        was deemed in satisfactory condition to undergo the                        procedure.                       After obtaining informed consent, the colonoscope was                        passed under direct vision. Throughout the procedure,                        the patient's blood pressure, pulse, and oxygen  saturations were monitored continuously. The                        Colonoscope was introduced through the anus and                        advanced to the the cecum, identified by appendiceal                        orifice and ileocecal valve.  The colonoscopy was                        performed without difficulty. The patient tolerated the                        procedure well. The quality of the bowel preparation                        was excellent. The ileocecal valve, appendiceal                        orifice, and rectum were photographed. Findings:      The perianal and digital rectal examinations were normal. Pertinent       negatives include normal sphincter tone and no palpable rectal lesions.      The colon (entire examined portion) appeared normal.      Non-bleeding internal hemorrhoids were found during retroflexion. The       hemorrhoids were Grade I (internal hemorrhoids that do not prolapse).      The exam was otherwise without abnormality. Impression:           - The entire examined colon is normal.                       - Non-bleeding internal hemorrhoids.                       - The examination was otherwise normal.                       - No specimens collected. Recommendation:       - Patient has a contact number available for                        emergencies. The signs and symptoms of potential                        delayed complications were discussed with the patient.                        Return to normal activities tomorrow. Written discharge                        instructions were provided to the patient.                       - Resume previous diet.                       - Continue present medications.                       -  Await pathology results from EGD, also performed                        today.                       - Repeat colonoscopy in 5 years for screening purposes.                       - Return to physician assistant in 1 month.                       - Please follow up with Tammi Klippel, PA-C for your                        visit to [facility]. Of course, I                        remain available to you if you need any help. Procedure Code(s):    ---  Professional ---                       G3151, Colorectal cancer screening; colonoscopy on                        individual at high risk Diagnosis Code(s):    --- Professional ---                       K64.0, First degree hemorrhoids                       Z80.0, Family history of malignant neoplasm of                        digestive organs CPT copyright 2019 American Medical Association. All rights reserved. The codes documented in this report are preliminary and upon coder review may  be revised to meet current compliance requirements. Efrain Sella MD, MD 06/29/2019 11:06:50 AM This report has been signed electronically. Number of Addenda: 0 Note Initiated On: 06/29/2019 10:21 AM Estimated Blood Loss: Estimated blood loss: none.      [facility]

## 2019-06-29 NOTE — Interval H&P Note (Signed)
History and Physical Interval Note:  0/16/5800 63:49 AM  Denise Perez  has presented today for surgery, with the diagnosis of Periumbilical Abdominal Pain Family History Colon Cander.  The various methods of treatment have been discussed with the patient and family. After consideration of risks, benefits and other options for treatment, the patient has consented to  Procedure(s): ESOPHAGOGASTRODUODENOSCOPY (EGD) WITH PROPOFOL (N/A) COLONOSCOPY WITH PROPOFOL (N/A) as a surgical intervention.  The patient's history has been reviewed, patient examined, no change in status, stable for surgery.  I have reviewed the patient's chart and labs.  Questions were answered to the patient's satisfaction.     [place], [place]

## 2019-06-30 ENCOUNTER — Encounter: Payer: Self-pay | Admitting: Internal Medicine

## 2019-06-30 LAB — SURGICAL PATHOLOGY

## 2019-06-30 NOTE — OR Nursing (Signed)
Some diarrhea, to let md know if it continues.

## 2019-07-14 DIAGNOSIS — R1033 Periumbilical pain: Secondary | ICD-10-CM | POA: Diagnosis not present

## 2019-07-14 DIAGNOSIS — K2990 Gastroduodenitis, unspecified, without bleeding: Secondary | ICD-10-CM | POA: Diagnosis not present

## 2019-07-14 DIAGNOSIS — Z800 Family history of malignant neoplasm of digestive organs: Secondary | ICD-10-CM | POA: Diagnosis not present

## 2019-07-18 ENCOUNTER — Encounter: Payer: Self-pay | Admitting: Family Medicine

## 2019-08-26 DIAGNOSIS — L821 Other seborrheic keratosis: Secondary | ICD-10-CM | POA: Diagnosis not present

## 2019-08-26 DIAGNOSIS — D225 Melanocytic nevi of trunk: Secondary | ICD-10-CM | POA: Diagnosis not present

## 2019-08-26 DIAGNOSIS — D485 Neoplasm of uncertain behavior of skin: Secondary | ICD-10-CM | POA: Diagnosis not present

## 2019-08-26 DIAGNOSIS — D2261 Melanocytic nevi of right upper limb, including shoulder: Secondary | ICD-10-CM | POA: Diagnosis not present

## 2019-08-26 DIAGNOSIS — D2272 Melanocytic nevi of left lower limb, including hip: Secondary | ICD-10-CM | POA: Diagnosis not present

## 2019-08-26 DIAGNOSIS — D2271 Melanocytic nevi of right lower limb, including hip: Secondary | ICD-10-CM | POA: Diagnosis not present

## 2019-08-26 DIAGNOSIS — L570 Actinic keratosis: Secondary | ICD-10-CM | POA: Diagnosis not present

## 2019-08-26 DIAGNOSIS — D2262 Melanocytic nevi of left upper limb, including shoulder: Secondary | ICD-10-CM | POA: Diagnosis not present

## 2019-09-06 DIAGNOSIS — L570 Actinic keratosis: Secondary | ICD-10-CM | POA: Diagnosis not present

## 2019-11-29 DIAGNOSIS — H02883 Meibomian gland dysfunction of right eye, unspecified eyelid: Secondary | ICD-10-CM | POA: Diagnosis not present

## 2020-01-09 DIAGNOSIS — H524 Presbyopia: Secondary | ICD-10-CM | POA: Diagnosis not present

## 2020-01-09 DIAGNOSIS — H2513 Age-related nuclear cataract, bilateral: Secondary | ICD-10-CM | POA: Diagnosis not present

## 2020-01-09 DIAGNOSIS — H0288B Meibomian gland dysfunction left eye, upper and lower eyelids: Secondary | ICD-10-CM | POA: Diagnosis not present

## 2020-01-09 DIAGNOSIS — H5213 Myopia, bilateral: Secondary | ICD-10-CM | POA: Diagnosis not present

## 2020-01-09 DIAGNOSIS — H0288A Meibomian gland dysfunction right eye, upper and lower eyelids: Secondary | ICD-10-CM | POA: Diagnosis not present

## 2020-05-03 ENCOUNTER — Ambulatory Visit (INDEPENDENT_AMBULATORY_CARE_PROVIDER_SITE_OTHER): Payer: PPO

## 2020-05-03 ENCOUNTER — Other Ambulatory Visit: Payer: Self-pay

## 2020-05-03 VITALS — BP 138/84 | HR 89 | Temp 96.6°F | Resp 16 | Ht 67.0 in | Wt 142.3 lb

## 2020-05-03 DIAGNOSIS — Z0000 Encounter for general adult medical examination without abnormal findings: Secondary | ICD-10-CM | POA: Diagnosis not present

## 2020-05-03 DIAGNOSIS — Z1231 Encounter for screening mammogram for malignant neoplasm of breast: Secondary | ICD-10-CM

## 2020-05-03 NOTE — Patient Instructions (Signed)
[name] , Thank you for taking time to come for your Medicare Wellness Visit. I appreciate your ongoing commitment to your health goals. Please review the following plan we discussed and let me know if I can assist you in the future.   Screening recommendations/referrals: Colonoscopy: done 06/29/19 Mammogram: done 05/31/19 Please call [phone] to schedule your mammogram.   Bone Density: Done 05/26/18 Declined at this time Recommended yearly ophthalmology/optometry visit for glaucoma screening and checkup Recommended yearly dental visit for hygiene and checkup  Vaccinations: Influenza vaccine: 08/24/18 Pneumococcal vaccine: 05/28/16, 04/22/18 Tdap vaccine: 03/08/13 Shingles vaccine: 06/28/18, 11/12/18   Covid-19:12/12/19, 01/09/20  Advanced directives: Please bring a copy of your health care power of attorney and living will to the office at your convenience.  Conditions/risks identified: Goal to lose weight 130's and maintain  Next appointment: Follow up in one year for your annual wellness visit    Preventive Care 65 Years and Older, Female Preventive care refers to lifestyle choices and visits with your health care provider that can promote health and wellness. What does preventive care include?  A yearly physical exam. This is also called an annual well check.  Dental exams once or twice a year.  Routine eye exams. Ask your health care provider how often you should have your eyes checked.  Personal lifestyle choices, including:  Daily care of your teeth and gums.  Regular physical activity.  Eating a healthy diet.  Avoiding tobacco and drug use.  Limiting alcohol use.  Practicing safe sex.  Taking low-dose aspirin every day.  Taking vitamin and mineral supplements as recommended by your health care provider. What happens during an annual well check? The services and screenings done by your health care provider during your annual well check will depend  on your age, overall health, lifestyle risk factors, and family history of disease. Counseling  Your health care provider may ask you questions about your:  Alcohol use.  Tobacco use.  Drug use.  Emotional well-being.  Home and relationship well-being.  Sexual activity.  Eating habits.  History of falls.  Memory and ability to understand (cognition).  Work and work Statistician.  Reproductive health. Screening  You may have the following tests or measurements:  Height, weight, and BMI.  Blood pressure.  Lipid and cholesterol levels. These may be checked every 5 years, or more frequently if you are over 66 years old.  Skin check.  Lung cancer screening. You may have this screening every year starting at age 50 if you have a 30-pack-year history of smoking and currently smoke or have quit within the past 15 years.  Fecal occult blood test (FOBT) of the stool. You may have this test every year starting at age 51.  Flexible sigmoidoscopy or colonoscopy. You may have a sigmoidoscopy every 5 years or a colonoscopy every 10 years starting at age 67.  Hepatitis C blood test.  Hepatitis B blood test.  Sexually transmitted disease (STD) testing.  Diabetes screening. This is done by checking your blood sugar (glucose) after you have not eaten for a while (fasting). You may have this done every 1-3 years.  Bone density scan. This is done to screen for osteoporosis. You may have this done starting at age 31.  Mammogram. This may be done every 1-2 years. Talk to your health care provider about how often you should have regular mammograms. Talk with your health care provider about your test results, treatment options, and if necessary, the need for more  tests. Vaccines  Your health care provider may recommend certain vaccines, such as:  Influenza vaccine. This is recommended every year.  Tetanus, diphtheria, and acellular pertussis (Tdap, Td) vaccine. You may need a Td  booster every 10 years.  Zoster vaccine. You may need this after age 70.  Pneumococcal 13-valent conjugate (PCV13) vaccine. One dose is recommended after age 47.  Pneumococcal polysaccharide (PPSV23) vaccine. One dose is recommended after age 72. Talk to your health care provider about which screenings and vaccines you need and how often you need them. This information is not intended to replace advice given to you by your health care provider. Make sure you discuss any questions you have with your health care provider. Document Released: 11/30/2015 Document Revised: 07/23/2016 Document Reviewed: 09/04/2015 Elsevier Interactive Patient Education  2017 [place] Prevention in the Home Falls can cause injuries. They can happen to people of all ages. There are many things you can do to make your home safe and to help prevent falls. What can I do on the outside of my home?  Regularly fix the edges of walkways and driveways and fix any cracks.  Remove anything that might make you trip as you walk through a door, such as a raised step or threshold.  Trim any bushes or trees on the path to your home.  Use bright outdoor lighting.  Clear any walking paths of anything that might make someone trip, such as rocks or tools.  Regularly check to see if handrails are loose or broken. Make sure that both sides of any steps have handrails.  Any raised decks and porches should have guardrails on the edges.  Have any leaves, snow, or ice cleared regularly.  Use sand or salt on walking paths during winter.  Clean up any spills in your garage right away. This includes oil or grease spills. What can I do in the bathroom?  Use night lights.  Install grab bars by the toilet and in the tub and shower. Do not use towel bars as grab bars.  Use non-skid mats or decals in the tub or shower.  If you need to sit down in the shower, use a plastic, non-slip stool.  Keep the floor dry. Clean up  any water that spills on the floor as soon as it happens.  Remove soap buildup in the tub or shower regularly.  Attach bath mats securely with double-sided non-slip rug tape.  Do not have throw rugs and other things on the floor that can make you trip. What can I do in the bedroom?  Use night lights.  Make sure that you have a light by your bed that is easy to reach.  Do not use any sheets or blankets that are too big for your bed. They should not hang down onto the floor.  Have a firm chair that has side arms. You can use this for support while you get dressed.  Do not have throw rugs and other things on the floor that can make you trip. What can I do in the kitchen?  Clean up any spills right away.  Avoid walking on wet floors.  Keep items that you use a lot in easy-to-reach places.  If you need to reach something above you, use a strong step stool that has a grab bar.  Keep electrical cords out of the way.  Do not use floor polish or wax that makes floors slippery. If you must use wax, use non-skid floor  wax.  Do not have throw rugs and other things on the floor that can make you trip. What can I do with my stairs?  Do not leave any items on the stairs.  Make sure that there are handrails on both sides of the stairs and use them. Fix handrails that are broken or loose. Make sure that handrails are as long as the stairways.  Check any carpeting to make sure that it is firmly attached to the stairs. Fix any carpet that is loose or worn.  Avoid having throw rugs at the top or bottom of the stairs. If you do have throw rugs, attach them to the floor with carpet tape.  Make sure that you have a light switch at the top of the stairs and the bottom of the stairs. If you do not have them, ask someone to add them for you. What else can I do to help prevent falls?  Wear shoes that:  Do not have high heels.  Have rubber bottoms.  Are comfortable and fit you well.  Are  closed at the toe. Do not wear sandals.  If you use a stepladder:  Make sure that it is fully opened. Do not climb a closed stepladder.  Make sure that both sides of the stepladder are locked into place.  Ask someone to hold it for you, if possible.  Clearly mark and make sure that you can see:  Any grab bars or handrails.  First and last steps.  Where the edge of each step is.  Use tools that help you move around (mobility aids) if they are needed. These include:  Canes.  Walkers.  Scooters.  Crutches.  Turn on the lights when you go into a dark area. Replace any light bulbs as soon as they burn out.  Set up your furniture so you have a clear path. Avoid moving your furniture around.  If any of your floors are uneven, fix them.  If there are any pets around you, be aware of where they are.  Review your medicines with your doctor. Some medicines can make you feel dizzy. This can increase your chance of falling. Ask your doctor what other things that you can do to help prevent falls. This information is not intended to replace advice given to you by your health care provider. Make sure you discuss any questions you have with your health care provider. Document Released: 08/30/2009 Document Revised: 04/10/2016 Document Reviewed: 12/08/2014 Elsevier Interactive Patient Education  2017 [employer].

## 2020-05-03 NOTE — Progress Notes (Signed)
Subjective:   Denise Perez is a 69 y.o. female who presents for Medicare Annual (Subsequent) preventive examination.  Review of Systems:   Cardiac Risk Factors include: advanced age (>26men, >89 women)     Objective:     Vitals: BP 138/84   Pulse 89   Temp (!) 96.6 F (35.9 C) (Temporal)   Resp 16   Ht 5\' 7"  (1.702 m)   Wt 142 lb 4.8 oz (64.5 kg)   SpO2 96%   BMI 22.29 kg/m   Body mass index is 22.29 kg/m.  Advanced Directives 05/03/2020 06/29/2019 04/29/2019 04/22/2018 06/03/2017 02/27/2017 05/28/2016  Does Patient Have a Medical Advance Directive? Yes Yes Yes Yes No No;Yes Yes  Type of Paramedic of [place];Living will [place];Out of facility DNR (pink MOST or yellow form);Living will Living will;Healthcare Power of [place];Living will - [place];Living will [place];Living will  Does patient want to make changes to medical advance directive? - - - - - - No - Patient declined  Copy of [place] in Chart? No - copy requested No - copy requested No - copy requested No - copy requested - No - copy requested No - copy requested    Tobacco Social History   Tobacco Use  Smoking Status Former Smoker  . Packs/day: 1.50  . Years: 15.00  . Pack years: 22.50  . Types: Cigarettes  . Start date: 11/17/1970  . Quit date: 11/17/1985  . Years since quitting: 34.4  Smokeless Tobacco Never Used  Tobacco Comment   smoking cessation materials not required     Counseling given: Not Answered Comment: smoking cessation materials not required   Clinical Intake:  Pre-visit preparation completed: Yes  Pain : No/denies pain     BMI - recorded: 22.29 Nutritional Status: BMI of 19-24  Normal Nutritional Risks: None Diabetes: No  How often do you need to have someone help you when you read instructions, pamphlets, or other written materials from your  doctor or pharmacy?: 1 - Never  Interpreter Needed?: No  Information entered by :: Clemetine Marker LPN  Past Medical History:  Diagnosis Date  . Cervical disc disease   . Cervical neuropathy   . GERD (gastroesophageal reflux disease)   . Osteoporosis   . Tietze's disease    Past Surgical History:  Procedure Laterality Date  . COLONOSCOPY WITH PROPOFOL N/A 08/13/2015   Procedure: COLONOSCOPY WITH PROPOFOL;  Surgeon: Hulen Luster, MD;  Location: [facility] ENDOSCOPY;  Service: Gastroenterology;  Laterality: N/A;  . COLONOSCOPY WITH PROPOFOL N/A 06/29/2019   Procedure: COLONOSCOPY WITH PROPOFOL;  Surgeon: Toledo, Benay Pike, MD;  Location: [facility] ENDOSCOPY;  Service: Gastroenterology;  Laterality: N/A;  . ESOPHAGOGASTRODUODENOSCOPY (EGD) WITH PROPOFOL    . ESOPHAGOGASTRODUODENOSCOPY (EGD) WITH PROPOFOL N/A 06/29/2019   Procedure: ESOPHAGOGASTRODUODENOSCOPY (EGD) WITH PROPOFOL;  Surgeon: Toledo, Benay Pike, MD;  Location: [facility] ENDOSCOPY;  Service: Gastroenterology;  Laterality: N/A;  . GUM SURGERY     Family History  Problem Relation Age of Onset  . Cancer Father        Colon  . Breast cancer Neg Hx    Social History   Socioeconomic History  . Marital status: Married    Spouse name: Glendell Docker  . Number of children: 0  . Years of education: Not on file  . Highest education level: Bachelor's degree (e.g., BA, AB, BS)  Occupational History  . Occupation: Retired  Tobacco Use  . Smoking status: Former Smoker    Packs/day: 1.50    Years: 15.00    Pack years: 22.50    Types: Cigarettes    Start date: 11/17/1970    Quit date: 11/17/1985    Years since quitting: 34.4  . Smokeless tobacco: Never Used  . Tobacco comment: smoking cessation materials not required  Vaping Use  . Vaping Use: Never used  Substance and Sexual Activity  . Alcohol use: Not Currently    Alcohol/week: 0.0 standard drinks    Comment: 5 glasses of wine a year  . Drug use: No  . Sexual activity: Not Currently    Partners: Male    Other Topics Concern  . Not on file  Social History Narrative  . Not on file   Social Determinants of Health   Financial Resource Strain: Low Risk   . Difficulty of Paying Living Expenses: Not hard at all  Food Insecurity: No Food Insecurity  . Worried About Charity fundraiser in the Last Year: Never true  . Ran Out of Food in the Last Year: Never true  Transportation Needs: No Transportation Needs  . Lack of Transportation (Medical): No  . Lack of Transportation (Non-Medical): No  Physical Activity: Sufficiently Active  . Days of Exercise per Week: 5 days  . Minutes of Exercise per Session: 50 min  Stress: No Stress Concern Present  . Feeling of Stress : Not at all  Social Connections: Socially Integrated  . Frequency of Communication with Friends and Family: More than three times a week  . Frequency of Social Gatherings with Friends and Family: More than three times a week  . Attends Religious Services: More than 4 times per year  . Active Member of Clubs or Organizations: Yes  . Attends Archivist Meetings: More than 4 times per year  . Marital Status: Married    Outpatient Encounter Medications as of 05/03/2020  Medication Sig  . Cholecalciferol (VITAMIN D) 2000 units CAPS Take 1 capsule (2,000 Units total) by mouth daily.  . [DISCONTINUED] famotidine (PEPCID) 20 MG tablet Take 20 mg by mouth 2 (two) times daily.   No facility-administered encounter medications on file as of 05/03/2020.    Activities of Daily Living In your present state of health, do you have any difficulty performing the following activities: 05/03/2020 05/16/2019  Hearing? N N  Vision? N N  Difficulty concentrating or making decisions? N N  Walking or climbing stairs? N N  Dressing or bathing? N N  Doing errands, shopping? N N  Preparing Food and eating ? N -  Using the Toilet? N -  In the past six months, have you accidently leaked urine? N -  Do you have problems with loss of bowel  control? N -  Managing your Medications? N -  Managing your Finances? N -  Housekeeping or managing your Housekeeping? N -  Some recent data might be hidden    Patient Care Team: Steele Sizer, MD as PCP - General (Family Medicine) Thelma Comp, [place] (Optometry) Winfield Rast, [place] as Consulting Physician (Chiropractic Medicine)    Assessment:   This is a routine wellness examination for Denise Perez.  Exercise Activities and Dietary recommendations Current Exercise Habits: Home exercise routine, Type of exercise: treadmill;Other - see comments (water aerobics), Time (Minutes): 40, Frequency (Times/Week): 5, Weekly Exercise (Minutes/Week): 200, Intensity: Moderate  Goals    . DIET - INCREASE WATER INTAKE     Recommend to  drink at least 6-8 8oz glasses of water per day.    . Patient Stated    . Patient Stated     Working to get weight back to the 130's lbs       Fall Risk Fall Risk  05/03/2020 05/16/2019 05/16/2019 04/29/2019 12/17/2018  Falls in the past year? 0 0 0 0 0  Number falls in past yr: 0 0 0 0 0  Injury with Fall? 0 0 0 0 0  Risk for fall due to : Impaired vision - - - -  Risk for fall due to: Comment - - - - -  Follow up Falls prevention discussed - - Falls prevention discussed -   FALL RISK PREVENTION PERTAINING TO THE HOME:  Any stairs in or around the home? Yes  If so, are there any without handrails? Yes   Home free of loose throw rugs in walkways, pet beds, electrical cords, etc? No  Adequate lighting in your home to reduce risk of falls? Yes   ASSISTIVE DEVICES UTILIZED TO PREVENT FALLS:  Life alert? No  Use of a cane, walker or w/c? No  Grab bars in the bathroom? No  Shower chair or bench in shower? No  Elevated toilet seat or a handicapped toilet? No   DME ORDERS:  DME order needed?  No   TIMED UP AND GO:  Was the test performed? Yes .  Length of time to ambulate 10 feet: 5 sec.   GAIT:  Appearance of gait: Gait steady and fast without the use  of an assistive device.   Education: Fall risk prevention has been discussed.  Intervention(s) required? No     Depression Screen PHQ 2/9 Scores 05/03/2020 05/16/2019 04/29/2019 12/17/2018  PHQ - 2 Score 0 0 0 0  PHQ- 9 Score - 1 - -     Cognitive Function 6CIT deferred for 2021 AWV pt has no memory issues     6CIT Screen 04/29/2019 04/22/2018 02/27/2017  What Year? 0 points 0 points 0 points  What month? 0 points 0 points 0 points  What time? 0 points 0 points 0 points  Count back from 20 0 points 0 points 0 points  Months in reverse 0 points 0 points 0 points  Repeat phrase 0 points 0 points 0 points  Total Score 0 0 0    Immunization History  Administered Date(s) Administered  . Influenza, High Dose Seasonal PF 08/14/2017, 08/24/2018  . Influenza-Unspecified 08/08/2014, 08/17/2017, 08/24/2018  . Moderna SARS-COVID-2 Vaccination 12/12/2019, 01/09/2020  . Pneumococcal Conjugate-13 04/22/2018  . Pneumococcal Polysaccharide-23 05/28/2016  . Tdap 03/08/2013  . Zoster 03/08/2013  . Zoster Recombinat (Shingrix) 06/28/2018, 11/12/2018    Qualifies for Shingles Vaccine? Yes  Shingrix series completed.   Tdap: Up to date  Flu Vaccine: Up to date  Pneumococcal Vaccine: Up to date  Covid-19 Vaccine: Up to date   Screening Tests Health Maintenance  Topic Date Due  . MAMMOGRAM  05/30/2020  . INFLUENZA VACCINE  06/17/2020  . TETANUS/TDAP  03/09/2023  . COLONOSCOPY  06/28/2024  . DEXA SCAN  Completed  . COVID-19 Vaccine  Completed  . Hepatitis C Screening  Completed  . PNA vac Low Risk Adult  Completed   Cancer Screenings:  Colorectal Screening: Completed 06/29/19. Repeat every 5 years;   Mammogram: Completed 05/31/19. Repeat every year. Ordered today. Pt provided with contact information and advised to call to schedule appt.   Bone Density: Completed 05/26/18. Results reflect OSTEOPOROSIS. Repeat every 2  years. Pt requests to discuss with Dr. Ancil Boozer at next visit because  she does not plan to take medication for osteoporosis.   Lung Cancer Screening: (Low Dose CT Chest recommended if Age 74-80 years, 30 pack-year currently smoking OR have quit w/in 15years.) does not qualify.   Additional Screening:  Hepatitis C Screening: does qualify; Completed 05/28/16  Vision Screening: Recommended annual ophthalmology exams for early detection of glaucoma and other disorders of the eye. Is the patient up to date with their annual eye exam?  Yes  Who is the provider or what is the name of the office in which the pt attends annual eye exams? [place]  Dental Screening: Recommended annual dental exams for proper oral hygiene  Community Resource Referral:  CRR required this visit?  No       Plan:     I have personally reviewed and addressed the Medicare Annual Wellness questionnaire and have noted the following in the patient's chart:  A. Medical and social history B. Use of alcohol, tobacco or illicit drugs  C. Current medications and supplements D. Functional ability and status E.  Nutritional status F.  Physical activity G. Advance directives H. List of other physicians I.  Hospitalizations, surgeries, and ER visits in previous 12 months J.  [place] such as hearing and vision if needed, cognitive and depression L. Referrals and appointments   In addition, I have reviewed and discussed with patient certain preventive protocols, quality metrics, and best practice recommendations. A written personalized care plan for preventive services as well as general preventive health recommendations were provided to patient.   Signed,  Clemetine Marker, LPN Nurse Health Advisor   Nurse Notes: pt c/o occasional pain in left wrist and thinks it may be arthritis but interested in potential x-ray if needed. Pt also c/o thinning hair and interested in biotin but would like to discuss prior to taking.

## 2020-05-07 ENCOUNTER — Other Ambulatory Visit: Payer: Self-pay

## 2020-05-07 ENCOUNTER — Ambulatory Visit (INDEPENDENT_AMBULATORY_CARE_PROVIDER_SITE_OTHER): Payer: PPO | Admitting: Family Medicine

## 2020-05-07 ENCOUNTER — Encounter: Payer: Self-pay | Admitting: Family Medicine

## 2020-05-07 VITALS — BP 130/90 | HR 97 | Temp 96.9°F | Resp 16 | Ht 66.0 in | Wt 140.8 lb

## 2020-05-07 DIAGNOSIS — E785 Hyperlipidemia, unspecified: Secondary | ICD-10-CM

## 2020-05-07 DIAGNOSIS — I700 Atherosclerosis of aorta: Secondary | ICD-10-CM | POA: Diagnosis not present

## 2020-05-07 DIAGNOSIS — E559 Vitamin D deficiency, unspecified: Secondary | ICD-10-CM

## 2020-05-07 DIAGNOSIS — M810 Age-related osteoporosis without current pathological fracture: Secondary | ICD-10-CM | POA: Diagnosis not present

## 2020-05-07 MED ORDER — ASPIRIN EC 81 MG PO TBEC: 81.0000 mg | DELAYED_RELEASE_TABLET | Freq: Every day | ORAL | 0 refills | Status: DC

## 2020-05-07 NOTE — Progress Notes (Signed)
Name: Denise Perez   MRN: [id number]    DOB: 1950/12/27   Date:05/07/2020       Progress Note  Subjective  Chief Complaint  Chief Complaint  Patient presents with  . Dyslipidemia  . Osteoarthritis  . Wrist Pain    she has been having intermittent pain in her left wrist. She has broken her wrist twice.    HPI  Age related osteoporosis: explained that was slight progression of osteoporosis and discussed medications options, for now she prefers exercising, adding weight while walking, she is taking Vitamin D otc supplementation daily. She is due for repeat bone density, but states she does not want medications, therefore we will hold off on checking it at this time.   Dyslipidemia: she is only on life style modification, since last visit she had CT abdomen pelvis that showed atherosclerosis of aorta, discussed starting statin therapy . She is not sure at this time if she wants to start statin therapy, but she is willing to start aspirin 81 mg daily   Gastroenteritis: seen by GI in 2020, had positive E. Coli on stools, she was given antibiotics, she also had EGD and CT abdomen - she was found to have gastroenteritis, she takes medication prn only. Diarrhea resolved.   Hair loss: she noticed thinning on top of her scalp over the past severe years, she bought some biotin but never started, she brought the bottle today. Advised to go ahead and start a few times a week.    Left Wrist pain: she has a history of left wrist fracture , she states she has pain on left lateral wrist intermittently, described either as sharp or aching, can last up to one hour highest pain is 5/10. No swelling or redness, pain is on dorsal ulnar aspect of wrist.   Patient Active Problem List   Diagnosis Date Noted  . Gastritis and duodenitis 07/14/2019  . FH: colon cancer 05/30/2019  . Periumbilical abdominal pain 05/30/2019  . Dyslipidemia 05/28/2016  . Osteoporosis 05/28/2016  . Cervical disc disease  05/28/2016  . Primary osteoarthritis involving multiple joints 06/11/2015  . Synovial cyst of wrist 06/11/2015    Past Surgical History:  Procedure Laterality Date  . COLONOSCOPY WITH PROPOFOL N/A 08/13/2015   Procedure: COLONOSCOPY WITH PROPOFOL;  Surgeon: Hulen Luster, MD;  [facility] END[facility]COPY;  Service: Gastroenterology;  Laterality: N/A;  . COLONOSCOPY WITH PROPOFOL N/A 06/29/2019   Procedure: COLONOSCOPY WITH PROPOFOL;  Surgeon: Toledo, Benay Pike, MD;  Location: [facility] ENDOSCOPY;  Service: Gastroenterology;  Laterality: N/A;  . ESOPHAGOGASTRODUODENOSCOPY (EGD) WITH PROPOFOL    . ESOPHAGOGASTRODUODENOSCOPY (EGD) WITH PROPOFOL N/A 06/29/2019   Procedure: ESOPHAGOGASTRODUODENOSCOPY (EGD) WITH PROPOFOL;  Surgeon: Toledo, Benay Pike, MD;  Location: [facility] ENDOSCOPY;  Service: Gastroenterology;  Laterality: N/A;  . GUM SURGERY      Family History  Problem Relation Age of Onset  . Cancer Father        Colon  . Breast cancer Neg Hx     Social History   Tobacco Use  . Smoking status: Former Smoker    Packs/day: 1.50    Years: 15.00    Pack years: 22.50    Types: Cigarettes    Start date: 11/17/1970    Quit date: 11/17/1985    Years since quitting: 34.4  . Smokeless tobacco: Never Used  . Tobacco comment: smoking cessation materials not required  Substance Use Topics  . Alcohol use: Not Currently    Alcohol/week: 0.0 standard drinks  Comment: 5 glasses of wine a year     Current Outpatient Medications:  .  Cholecalciferol (VITAMIN D) 2000 units CAPS, Take 1 capsule (2,000 Units total) by mouth daily., Disp: 30 capsule, Rfl: 0  Allergies  Allergen Reactions  . Prednisone Other (See Comments)    Shaking    I personally reviewed active problem list, medication list, allergies, family history, social history, health maintenance with the patient/caregiver today.   ROS  Constitutional: Negative for fever or weight change.  Respiratory: Negative for cough and shortness of  breath.   Cardiovascular: Negative for chest pain or palpitations.  Gastrointestinal: Negative for abdominal pain, no bowel changes.  Musculoskeletal: Negative for gait problem or joint swelling.  Skin: Negative for rash.  Neurological: Negative for dizziness or headache.  No other specific complaints in a complete review of systems (except as listed in HPI above).  Objective  Vitals:   05/07/20 0834  BP: 130/90  Pulse: 97  Resp: 16  Temp: (!) 96.9 F (36.1 C)  TempSrc: Temporal  SpO2: 96%  Weight: 140 lb 12.8 oz (63.9 kg)  Height: 5\' 6"  (1.676 m)    Body mass index is 22.73 kg/m.  Physical Exam  Constitutional: Patient appears well-developed and well-nourished.  No distress.  HEENT: head atraumatic, normocephalic, pupils equal and reactive to light,  neck supple Cardiovascular: Normal rate, regular rhythm and normal heart sounds.  No murmur heard. No BLE edema. Pulmonary/Chest: Effort normal and breath sounds normal. No respiratory distress. Abdominal: Soft.  There is no tenderness. Muscular skeletal: normal left wrist exam.  Psychiatric: Patient has a normal mood and affect. behavior is normal. Judgment and thought content normal.  PHQ2/9: Depression screen [facility] 2/9 05/07/2020 05/03/2020 05/16/2019 04/29/2019 12/17/2018  Decreased Interest 0 0 0 0 0  Down, Depressed, Hopeless 0 0 0 0 0  PHQ - 2 Score 0 0 0 0 0  Altered sleeping 0 - 0 - -  Tired, decreased energy 0 - 1 - -  Change in appetite 0 - 0 - -  Feeling bad or failure about yourself  0 - 0 - -  Trouble concentrating 0 - 0 - -  Moving slowly or fidgety/restless 0 - 0 - -  Suicidal thoughts 0 - 0 - -  PHQ-9 Score 0 - 1 - -  Difficult doing work/chores - - Not difficult at all - -    phq 9 is negative   Fall Risk: Fall Risk  05/07/2020 05/03/2020 05/16/2019 05/16/2019 04/29/2019  Falls in the past year? 0 0 0 0 0  Number falls in past yr: 0 0 0 0 0  Injury with Fall? 0 0 0 0 0  Risk for fall due to : - Impaired  vision - - -  Risk for fall due to: Comment - - - - -  Follow up - Falls prevention discussed - - Falls prevention discussed     Functional Status Survey: Is the patient deaf or have difficulty hearing?: No Does the patient have difficulty seeing, even when wearing glasses/contacts?: No Does the patient have difficulty concentrating, remembering, or making decisions?: No Does the patient have difficulty walking or climbing stairs?: No Does the patient have difficulty dressing or bathing?: No Does the patient have difficulty doing errands alone such as visiting a doctor's office or shopping?: No    Assessment & Plan  1. Atherosclerosis of aorta ([place])  Discussed statin therapy, but she does not want to take it at this time  -  Lipid panel - COMPLETE METABOLIC PANEL WITH GFR  2. Dyslipidemia   3. Vitamin D deficiency  Continue supplementation   4. Age-related osteoporosis without current pathological fracture  - COMPLETE METABOLIC PANEL WITH GFR

## 2020-05-07 NOTE — Patient Instructions (Signed)
Prolia Forteo Reclast

## 2020-05-08 LAB — LIPID PANEL
Cholesterol: 233 mg/dL — ABNORMAL HIGH (ref <200)
HDL: 75 mg/dL (ref >=50)
LDL Cholesterol (Calc): 141 mg/dL (calc) — ABNORMAL HIGH
Non-HDL Cholesterol (Calc): 158 mg/dL (calc) — ABNORMAL HIGH (ref <130)
Total CHOL/HDL Ratio: 3.1 (calc) (ref <5.0)
Triglycerides: 75 mg/dL (ref <150)

## 2020-05-08 LAB — COMPLETE METABOLIC PANEL WITH GFR
AG Ratio: 1.8 (calc) (ref 1.0–2.5)
ALT: 13 U/L (ref 6–29)
AST: 20 U/L (ref 10–35)
Albumin: 4.2 g/dL (ref 3.6–5.1)
Alkaline phosphatase (APISO): 87 U/L (ref 37–153)
BUN: 14 mg/dL (ref 7–25)
CO2: 26 mmol/L (ref 20–32)
Calcium: 9.3 mg/dL (ref 8.6–10.4)
Chloride: 107 mmol/L (ref 98–110)
Creat: 0.89 mg/dL (ref 0.50–0.99)
GFR, Est African American: 77 mL/min/{1.73_m2} (ref >=60)
GFR, Est Non African American: 66 mL/min/{1.73_m2} (ref >=60)
Globulin: 2.4 g/dL (calc) (ref 1.9–3.7)
Glucose, Bld: 87 mg/dL (ref 65–99)
Potassium: 4.3 mmol/L (ref 3.5–5.3)
Sodium: 144 mmol/L (ref 135–146)
Total Bilirubin: 0.5 mg/dL (ref 0.2–1.2)
Total Protein: 6.6 g/dL (ref 6.1–8.1)

## 2020-05-31 ENCOUNTER — Ambulatory Visit
Admission: RE | Admit: 2020-05-31 | Discharge: 2020-05-31 | Disposition: A | Discharge status: Home / Self Care | Payer: PPO | Source: Ambulatory Visit | Attending: Family Medicine | Admitting: Family Medicine

## 2020-05-31 ENCOUNTER — Other Ambulatory Visit: Payer: Self-pay

## 2020-05-31 DIAGNOSIS — Z1231 Encounter for screening mammogram for malignant neoplasm of breast: Secondary | ICD-10-CM | POA: Diagnosis not present

## 2020-08-22 ENCOUNTER — Other Ambulatory Visit: Payer: PPO

## 2020-08-22 DIAGNOSIS — Z20822 Contact with and (suspected) exposure to covid-19: Secondary | ICD-10-CM

## 2020-08-23 LAB — SARS-COV-2, NAA 2 DAY TAT

## 2020-08-23 LAB — NOVEL CORONAVIRUS, NAA: SARS-CoV-2, NAA: NOT DETECTED

## 2020-08-29 DIAGNOSIS — D225 Melanocytic nevi of trunk: Secondary | ICD-10-CM | POA: Diagnosis not present

## 2020-08-29 DIAGNOSIS — L821 Other seborrheic keratosis: Secondary | ICD-10-CM | POA: Diagnosis not present

## 2020-08-29 DIAGNOSIS — L648 Other androgenic alopecia: Secondary | ICD-10-CM | POA: Diagnosis not present

## 2020-08-29 DIAGNOSIS — D2262 Melanocytic nevi of left upper limb, including shoulder: Secondary | ICD-10-CM | POA: Diagnosis not present

## 2020-08-29 DIAGNOSIS — D2261 Melanocytic nevi of right upper limb, including shoulder: Secondary | ICD-10-CM | POA: Diagnosis not present

## 2020-08-29 DIAGNOSIS — D2272 Melanocytic nevi of left lower limb, including hip: Secondary | ICD-10-CM | POA: Diagnosis not present

## 2020-08-29 DIAGNOSIS — D2271 Melanocytic nevi of right lower limb, including hip: Secondary | ICD-10-CM | POA: Diagnosis not present

## 2020-10-15 ENCOUNTER — Ambulatory Visit: Payer: PPO | Attending: Internal Medicine

## 2020-10-15 DIAGNOSIS — Z23 Encounter for immunization: Secondary | ICD-10-CM

## 2020-10-15 NOTE — Progress Notes (Signed)
   Covid-19 Vaccination Clinic  Name:  Raniyah Curenton    MRN: [id number] DOB: [birth year]  10/15/2020  Ms. Sheilla Maris was observed post Covid-19 immunization for 15 minutes without incident. She was provided with Vaccine Information Sheet and instruction to access the V-Safe system.   Ms. Makyah Lavigne was instructed to call 911 with any severe reactions post vaccine: Marland Kitchen Difficulty breathing  . Swelling of face and throat  . A fast heartbeat  . A bad rash all over body  . Dizziness and weakness   Immunizations Administered    No immunizations on file.

## 2020-10-17 ENCOUNTER — Ambulatory Visit: Payer: Self-pay | Admitting: *Deleted

## 2020-10-17 NOTE — Telephone Encounter (Signed)
Patient is calling to report that she is concerns about red area at injection site- quarter size today. Advised patient on home care of site- she will use treatments suggested and call back if the area gets larger. Reason for Disposition . [1] Pain, tenderness, or swelling at the injection site AND [2] over 3 days (72 hours) since vaccine AND [3] getting worse  Answer Assessment - Initial Assessment Questions 1. MAIN CONCERN OR SYMPTOM:  "What is your main concern right now?" "What question do you have?" "What's the main symptom you're worried about?" (e.g., fever, pain, redness, swelling)     Patient is reporting : chills, site pain- pink area 2. VACCINE: "What vaccination did you receive?" "Is this your first or second shot?" (e.g., none; AstraZeneca, J&J, [place], [place], other)     Moderna 3. SYMPTOM ONSET: "When did the swelling begin?" (e.g., not relevant; hours, days)      Following morning 4. SYMPTOM SEVERITY: "How bad is it?"      Red area getting larger 5. FEVER: "Is there a fever?" If Yes, ask: "What is it, how was it measured, and when did it start?"      no 6. PAST REACTIONS: "Have you reacted to immunizations before?" If Yes, ask: "What happened?"     Flu shot- years ago 7. OTHER SYMPTOMS: "Do you have any other symptoms?"     no  Protocols used: CORONAVIRUS (COVID-19) VACCINE QUESTIONS AND REACTIONS-A-AH

## 2020-10-19 ENCOUNTER — Ambulatory Visit (INDEPENDENT_AMBULATORY_CARE_PROVIDER_SITE_OTHER): Payer: PPO | Admitting: Internal Medicine

## 2020-10-19 ENCOUNTER — Encounter: Payer: Self-pay | Admitting: Internal Medicine

## 2020-10-19 ENCOUNTER — Other Ambulatory Visit: Payer: Self-pay

## 2020-10-19 VITALS — BP 120/80 | HR 68 | Temp 98.3°F | Resp 16 | Ht 66.0 in | Wt 141.3 lb

## 2020-10-19 DIAGNOSIS — T881XXA Other complications following immunization, not elsewhere classified, initial encounter: Secondary | ICD-10-CM

## 2020-10-19 NOTE — Patient Instructions (Signed)
Do feel your symptoms are consistent with a mild local reaction to the Covid booster  Can continue the generic antihistamine as you are taking presently as needed.  Also local measures as we discussed can be helpful.  Continue to closely monitor also is important, and if more concerns arising, follow-up as we discussed.

## 2020-10-19 NOTE — Telephone Encounter (Signed)
Patient has been using ice and antihistamine- patient reports her arm is not better- as advised she is calling back- appointment made at office for evaluation of her injection site.

## 2020-10-19 NOTE — Progress Notes (Signed)
Patient ID: Denise Perez, female    DOB: 06-24-1951, 69 y.o.   MRN: [id number]  PCP: Denise Sizer, MD  Chief Complaint  Patient presents with  . Medication Reaction    Reaction to [employer].    Subjective:   Denise Perez is a 69 y.o. female, presents to clinic with CC of the following:  Chief Complaint  Patient presents with  . Medication Reaction    Reaction to [employer].    HPI:  Patient is a 69 year old female patient of Dr. Ancil Perez Last visit with her was in June 2021. Follows up today with concerns for a reaction to the Covid booster which was given 10/15/2020.  She notes she got her Covid booster Monday, had some mild chills that night, and then noticed the next day a quarter sized red area around the injection site.  It has slowly increased in size since.  Is not markedly painful, although when she touches it still is uncomfortable.  Noted was a little harder at the site, although that has lessened.  Denies any red streaks, no fevers, no shortness of breath, no chest pains, no feelings like her throat is closing. She has tried a generic [employer] brand antihistamine more in the evening, and applied some cold. She otherwise has had no concerning symptoms, and went to water aerobics yesterday and did well.   Patient Active Problem List   Diagnosis Date Noted  . Atherosclerosis of aorta ([place]) 05/07/2020  . FH: colon cancer 05/30/2019  . Dyslipidemia 05/28/2016  . Osteoporosis 05/28/2016  . Cervical disc disease 05/28/2016  . Primary osteoarthritis involving multiple joints 06/11/2015  . Synovial cyst of wrist 06/11/2015      Current Outpatient Medications:  .  aspirin EC 81 MG tablet, Take 1 tablet (81 mg total) by mouth daily., Disp: 30 tablet, Rfl: 0 .  Cholecalciferol (VITAMIN D) 2000 units CAPS, Take 1 capsule (2,000 Units total) by mouth daily., Disp: 30 capsule, Rfl: 0   Allergies  Allergen Reactions  . Prednisone Other (See Comments)     Shaking     Past Surgical History:  Procedure Laterality Date  . COLONOSCOPY WITH PROPOFOL N/A 08/13/2015   Procedure: COLONOSCOPY WITH PROPOFOL;  Surgeon: Hulen Luster, MD;  Location: [facility] ENDOSCOPY;  Service: Gastroenterology;  Laterality: N/A;  . COLONOSCOPY WITH PROPOFOL N/A 06/29/2019   Procedure: COLONOSCOPY WITH PROPOFOL;  Surgeon: Toledo, Benay Pike, MD;  Location: [facility] ENDOSCOPY;  Service: Gastroenterology;  Laterality: N/A;  . ESOPHAGOGASTRODUODENOSCOPY (EGD) WITH PROPOFOL    . ESOPHAGOGASTRODUODENOSCOPY (EGD) WITH PROPOFOL N/A 06/29/2019   Procedure: ESOPHAGOGASTRODUODENOSCOPY (EGD) WITH PROPOFOL;  Surgeon: Toledo, Benay Pike, MD;  Location: [facility] ENDOSCOPY;  Service: Gastroenterology;  Laterality: N/A;  . GUM SURGERY       Family History  Problem Relation Age of Onset  . Cancer Father        Colon  . Breast cancer Neg Hx      Social History   Tobacco Use  . Smoking status: Former Smoker    Packs/day: 1.50    Years: 15.00    Pack years: 22.50    Types: Cigarettes    Start date: 11/17/1970    Quit date: 11/17/1985    Years since quitting: 34.9  . Smokeless tobacco: Never Used  . Tobacco comment: smoking cessation materials not required  Substance Use Topics  . Alcohol use: Not Currently    Alcohol/week: 0.0 standard drinks    Comment: 5 glasses  of wine a year    With staff assistance, above reviewed with the patient today.  ROS: As per HPI, otherwise no specific complaints on a limited and focused system review   No results found for this or any previous visit (from the past 72 hour(s)).   PHQ2/9: Depression screen [facility] 2/9 10/19/2020 05/07/2020 05/03/2020 05/16/2019 04/29/2019  Decreased Interest 0 0 0 0 0  Down, Depressed, Hopeless 0 0 0 0 0  PHQ - 2 Score 0 0 0 0 0  Altered sleeping - 0 - 0 -  Tired, decreased energy - 0 - 1 -  Change in appetite - 0 - 0 -  Feeling bad or failure about yourself  - 0 - 0 -  Trouble concentrating - 0 - 0 -  Moving slowly or  fidgety/restless - 0 - 0 -  Suicidal thoughts - 0 - 0 -  PHQ-9 Score - 0 - 1 -  Difficult doing work/chores - - - Not difficult at all -   PHQ-2/9 Result is neg  Fall Risk: Fall Risk  10/19/2020 05/07/2020 05/03/2020 05/16/2019 05/16/2019  Falls in the past year? 0 0 0 0 0  Number falls in past yr: 0 0 0 0 0  Injury with Fall? 0 0 0 0 0  Risk for fall due to : - - Impaired vision - -  Risk for fall due to: Comment - - - - -  Follow up - - Falls prevention discussed - -      Objective:   Vitals:   10/19/20 0933  BP: 120/80  Pulse: 68  Resp: 16  Temp: 98.3 F (36.8 C)  TempSrc: Oral  SpO2: 99%  Weight: 141 lb 4.8 oz (64.1 kg)  Height: 5\' 6"  (1.676 m)    Body mass index is 22.81 kg/m.  Physical Exam   NAD, masked, well-appearing and very pleasant HEENT - [place]/AT, sclera anicteric, PERRL, conj - non-inj'ed,  pharynx clear Neck - supple, no rigidity, carotids 2+ and = without bruits bilat Car - RRR without m/g/r, not tachycardic Pulm- RR and effort normal at rest, CTA without wheeze or rales Abd - soft, NT diffusely, ND,  Skin-an ovoid 5 cm x 7 cm erythematous area was noted in the right deltoid, with a question of some very subtle firmness centrally which was a few millimeters in diameter and tender to palpation.  No other marked concerning induration, No erythematous streaks in the upper extremity, no significant axillary adenopathy, no antecubital adenopathy, sensation intact distally to light touch, with adequate strength of the upper extremity. Neuro/psychiatric - affect was not flat, appropriate with conversation  Alert with normal speech  Results for orders placed or performed in visit on 08/22/20  Novel Coronavirus, NAA ([facility])   Specimen: Nasopharyngeal(NP) swabs in vial transport medium   Nasopharynge  Screenin  Result Value Ref Range   SARS-CoV-2, NAA Not Detected Not Detected  SARS-COV-2, NAA 2 DAY TAT   Nasopharynge  Screenin  Result Value Ref Range    SARS-CoV-2, NAA 2 DAY TAT Performed        Assessment & Plan:    1. Local reaction to COVID-19 vaccine booster  Educated patient on this and do feel it is a mild local reaction to the booster. Can continue the generic antihistamine, and noted often can make drowsy and has been taking mainly at nighttime and can continue doing so. Also recommended some warm compresses to the area, and then later at night, can apply a  cool compress to help lessen inflammation Discussed the importance of continuing to closely monitor, and if the redness continues to increase in size, if she develops any streaking red areas down the arm or up the shoulder, or develops more concerning systemic symptoms such as fever, nausea or vomiting, chest pains or shortness of breath, she needs to be seen on a follow-up more emergently, and she was understanding of that.  She will keep her planned follow-up with Dr. Ancil Perez in 2022. She wanted to inform me that she stopped the biotin product she was taking, and also asked about continuing a baby aspirin daily, and felt that the risk/benefit of that is to continue presently, although I did note she should get a recommendation from Dr. Ancil Perez, the PCP who has been following her over time as well.      Towanda Malkin, MD 10/19/20 9:46 AM

## 2020-11-26 ENCOUNTER — Other Ambulatory Visit: Payer: PPO

## 2020-11-26 DIAGNOSIS — Z20822 Contact with and (suspected) exposure to covid-19: Secondary | ICD-10-CM | POA: Diagnosis not present

## 2020-11-27 LAB — SARS-COV-2, NAA 2 DAY TAT

## 2020-11-27 LAB — NOVEL CORONAVIRUS, NAA: SARS-CoV-2, NAA: NOT DETECTED

## 2021-02-28 DIAGNOSIS — H5213 Myopia, bilateral: Secondary | ICD-10-CM | POA: Diagnosis not present

## 2021-02-28 DIAGNOSIS — H2513 Age-related nuclear cataract, bilateral: Secondary | ICD-10-CM | POA: Diagnosis not present

## 2021-02-28 DIAGNOSIS — H524 Presbyopia: Secondary | ICD-10-CM | POA: Diagnosis not present

## 2021-02-28 DIAGNOSIS — H0288B Meibomian gland dysfunction left eye, upper and lower eyelids: Secondary | ICD-10-CM | POA: Diagnosis not present

## 2021-02-28 DIAGNOSIS — H0288A Meibomian gland dysfunction right eye, upper and lower eyelids: Secondary | ICD-10-CM | POA: Diagnosis not present

## 2021-03-27 ENCOUNTER — Telehealth: Payer: Self-pay | Admitting: *Deleted

## 2021-03-27 NOTE — Telephone Encounter (Signed)
Pt reports exposure to covid last night, over 3 hours, direct exposure. Guidelines reviewed for self isolation and testing. Pt is asymptomatic at this time. Verbalizes understanding of guidelines.

## 2021-04-01 NOTE — Telephone Encounter (Signed)
Pt called stating that she has come back with an at home positive covid test. She states that she currently is having a low temp. Pt is requesting to know what her options are as far as medication. Please advise.

## 2021-04-01 NOTE — Telephone Encounter (Signed)
Telephone appt sch'd with Denise Perez for tomorrow. Pt was requesting a medication to shorten the covid virus (plaxivide?)

## 2021-04-02 ENCOUNTER — Ambulatory Visit (INDEPENDENT_AMBULATORY_CARE_PROVIDER_SITE_OTHER): Payer: PPO | Admitting: Unknown Physician Specialty

## 2021-04-02 ENCOUNTER — Other Ambulatory Visit: Payer: Self-pay

## 2021-04-02 ENCOUNTER — Encounter: Payer: Self-pay | Admitting: Unknown Physician Specialty

## 2021-04-02 VITALS — Temp 99.4°F

## 2021-04-02 DIAGNOSIS — U071 COVID-19: Secondary | ICD-10-CM | POA: Diagnosis not present

## 2021-04-02 MED ORDER — NIRMATRELVIR/RITONAVIR (PAXLOVID)TABLET
3.0000 | ORAL_TABLET | Freq: Two times a day (BID) | ORAL | 0 refills | Status: AC
  Filled 2021-04-02: qty 30, 5d supply, fill #0

## 2021-04-02 NOTE — Progress Notes (Signed)
Temp 99.4 F (37.4 C) (Oral)    Subjective:    Patient ID: Denise Perez, female    DOB: 1950/12/11, 70 y.o.   MRN: [id number]  HPI: Denise Perez is a 70 y.o. female  Chief Complaint  Patient presents with  . Covid Positive    This visit was completed via telephone due to the restrictions of the COVID-19 pandemic. All issues as above were discussed and addressed but no physical exam was performed. If it was felt that the patient should be evaluated in the office, they were directed there. The patient verbally consented to this visit. Patient was unable to complete an audio/visual visit due to Technical difficulties. . Location of the patient: home . Location of the provider: work . Those involved with this call:  . Provider: Kathrine Haddock . CMA: Hollie Salk  . Time spent on call: 20 minutes on the phone discussing health concerns. 20 minutes total spent in review of patient's record and preparation of their chart.  I verified patient identity using two factors (patient name and date of birth). Patient consents verbally to being seen via telemedicine visit today.   Outpatient Oral COVID Treatment Note  I connected with Denise Perez on 06/27/5725/20:35 AM by telephone and verified that I am speaking with the correct person using two identifiers.  I discussed the limitations, risks, security, and privacy concerns of performing an evaluation and management service by telephone and the availability of in person appointments. I also discussed with the patient that there may be a patient responsible charge related to this service. The patient expressed understanding and agreed to proceed.  Patient location: home Provider location: work  Diagnosis: COVID-19 infection  Purpose of visit: Discussion of potential use of Molnupiravir or Paxlovid, a new treatment for mild to moderate COVID-19 viral infection in non-hospitalized patients.   Subjective: Patient is a 70 y.o. female  who has been diagnosed with COVID 19 viral infection.  Their symptoms began on 5/16 with chills and fatigue.    Past Medical History:  Diagnosis Date  . Cervical disc disease   . Cervical neuropathy   . GERD (gastroesophageal reflux disease)   . Osteoporosis   . Tietze's disease     Allergies  Allergen Reactions  . Prednisone Other (See Comments)    Shaking     Current Outpatient Medications:  .  aspirin EC 81 MG tablet, Take 1 tablet (81 mg total) by mouth daily., Disp: 30 tablet, Rfl: 0 .  Cholecalciferol (VITAMIN D) 2000 units CAPS, Take 1 capsule (2,000 Units total) by mouth daily., Disp: 30 capsule, Rfl: 0 .  nirmatrelvir/ritonavir EUA (PAXLOVID) TABS, Take 3 tablets by mouth 2 (two) times daily for 5 days. Patient GFR is >60 Take nirmatrelvir (150 mg) two tablets twice daily for 5 days and ritonavir (100 mg) one tablet twice daily for 5 days., Disp: 30 tablet, Rfl: 0  Objective: Patient appears/sounds congested.  They are in no apparent distress.  Breathing is non labored.  Mood and behavior are normal.  Laboratory Data:  No results found for this or any previous visit (from the past 2160 hour(s)).   Assessment: 70 y.o. female with mild/moderate COVID 19 viral infection diagnosed on 5/16 at high risk for progression to severe COVID 19.  Plan:  This patient is a 70 y.o. female that meets the following criteria for Emergency Use Authorization of: Paxlovid 1. Age >12 yr AND > 40 kg 2. SARS-COV-2 positive  test 3. Symptom onset < 5 days 4. Mild-to-moderate COVID disease with high risk for severe progression to hospitalization or death  I have spoken and communicated the following to the patient or parent/caregiver regarding: 1. Paxlovid is an unapproved drug that is authorized for use under an Emergency Use Authorization.  2. There are no adequate, approved, available products for the treatment of COVID-19 in adults who have mild-to-moderate COVID-19 and are at high risk for  progressing to severe COVID-19, including hospitalization or death. 3. Other therapeutics are currently authorized. For additional information on all products authorized for treatment or prevention of COVID-19, please see [employer].  4. There are benefits and risks of taking this treatment as outlined in the "Fact Sheet for Patients and Caregivers."  5. "Fact Sheet for Patients and Caregivers" was reviewed with patient. A hard copy will be provided to patient from pharmacy prior to the patient receiving treatment. 6. Patients should continue to self-isolate and use infection control measures (e.g., wear mask, isolate, social distance, avoid sharing personal items, clean and disinfect "high touch" surfaces, and frequent handwashing) according to CDC guidelines.  7. The patient or parent/caregiver has the option to accept or refuse treatment. 8. Patient medication history was reviewed for potential drug interactions:No drug interactions 9. Patient's GFR was calculated to be 66 one year ago bu no chronic conditions , and they were therefore prescribed Normal dose (GFR>60) - nirmatrelvir 150mg  tab (2 tablet) by mouth twice daily AND ritonavir 100mg  tab (1 tablet) by mouth twice daily   After reviewing above information with the patient, the patient agrees to receive Paxlovid.  Follow up instructions:    . Take prescription BID x 5 days as directed . Reach out to pharmacist for counseling on medication if desired . For concerns regarding further COVID symptoms please follow up with your PCP or urgent care . For urgent or life-threatening issues, seek care at your local emergency department  The patient was provided an opportunity to ask questions, and all were answered. The patient agreed with the plan and demonstrated an understanding of the instructions.   Script sent to [place] and opted to pick up RX.  The patient was advised to call their PCP or seek an in-person evaluation if the symptoms worsen or if the condition fails to improve as anticipated.   I provided 20 minutes of non face-to-face telephone visit time during this encounter, and > 50% was spent counseling as documented under my assessment & plan.  Kathrine Haddock, NP 04/02/2021 /10:26 AM

## 2021-04-04 ENCOUNTER — Ambulatory Visit: Payer: PPO | Admitting: Unknown Physician Specialty

## 2021-05-06 NOTE — Progress Notes (Signed)
Name: Denise Perez   MRN: [id number]    DOB: Dec 06, 1950   Date:05/07/2021       Progress Note  Subjective  Chief Complaint  Follow up   HPI  [place] COVID-19: she was diagnosed in May and took Paxlovid, her symptoms were very mild, however she states since COVID-19 she has noticed decrease in exercise tolerance. Mostly when going to gym, or walking a long distance, but also after a shower. No chest pain or palpitation. She states gradually improving but discussed referral to cardiologist if she changes her mind. She states cough resolved.   Age related osteoporosis: explained that was slight progression of osteoporosis and discussed medications options, she has been physically active - going to the gym 4-5 times a week. We will recheck bone density today    Dyslipidemia: she is only on life style modification, since last visit she had CT abdomen pelvis that showed atherosclerosis of aorta.  She declines starting statin, but has been active, healthy diet and now added Avocado . She was on aspirin but stopped a couple of months ago, discussed USPTF and she will take it only a few times a week from now on    Hair loss: she noticed thinning on top of her scalp over the past several years, she was seen by Dr. Evorn Gong and will start Minoxidil now     Patient Active Problem List   Diagnosis Date Noted   Atherosclerosis of aorta ([place]) 05/07/2020   FH: colon cancer 05/30/2019   Dyslipidemia 05/28/2016   Osteoporosis 05/28/2016   Cervical disc disease 05/28/2016   Primary osteoarthritis involving multiple joints 06/11/2015   Synovial cyst of wrist 06/11/2015    Past Surgical History:  Procedure Laterality Date   COLONOSCOPY WITH PROPOFOL N/A 08/13/2015   Procedure: COLONOSCOPY WITH PROPOFOL;  Surgeon: Hulen Luster, MD;  Location: [facility] ENDOSCOPY;  Service: Gastroenterology;  Laterality: N/A;   COLONOSCOPY WITH PROPOFOL N/A 06/29/2019   Procedure: COLONOSCOPY WITH PROPOFOL;  Surgeon: Toledo,  Benay Pike, MD;  Location: [facility] ENDOSCOPY;  Service: Gastroenterology;  Laterality: N/A;   ESOPHAGOGASTRODUODENOSCOPY (EGD) WITH PROPOFOL     ESOPHAGOGASTRODUODENOSCOPY (EGD) WITH PROPOFOL N/A 06/29/2019   Procedure: ESOPHAGOGASTRODUODENOSCOPY (EGD) WITH PROPOFOL;  Surgeon: Toledo, Benay Pike, MD;  Location: [facility] ENDOSCOPY;  Service: Gastroenterology;  Laterality: N/A;   GUM SURGERY      Family History  Problem Relation Age of Onset   Cancer Father        Colon   Breast cancer Neg Hx     Social History   Tobacco Use   Smoking status: Former    Packs/day: 1.50    Years: 15.00    Pack years: 22.50    Types: Cigarettes    Start date: 11/17/1970    Quit date: 11/17/1985    Years since quitting: 35.4   Smokeless tobacco: Never   Tobacco comments:    smoking cessation materials not required  Substance Use Topics   Alcohol use: Not Currently    Alcohol/week: 0.0 standard drinks    Comment: 5 glasses of wine a year     Current Outpatient Medications:    Cholecalciferol (VITAMIN D) 2000 units CAPS, Take 1 capsule (2,000 Units total) by mouth daily., Disp: 30 capsule, Rfl: 0  Allergies  Allergen Reactions   Prednisone Other (See Comments)    Shaking    I personally reviewed active problem list, medication list, allergies, family history, social history, health maintenance, notes from last encounter with  the patient/caregiver today.   ROS  Constitutional: Negative for fever or weight change.  Respiratory: Negative for cough and shortness of breath.   Cardiovascular: Negative for chest pain or palpitations.  Gastrointestinal: Negative for abdominal pain, no bowel changes.  Musculoskeletal: Negative for gait problem or joint swelling.  Skin: Negative for rash.  Neurological: Negative for dizziness or headache.  No other specific complaints in a complete review of systems (except as listed in HPI above).   Objective  Vitals:   05/07/21 0910  BP: 132/84  Pulse: (!) 59  Resp:  16  Temp: 98.7 F (37.1 C)  TempSrc: Oral  SpO2: 98%  Weight: 141 lb (64 kg)  Height: 5\' 6"  (1.676 m)    Body mass index is 22.76 kg/m.  Physical Exam  Constitutional: Patient appears well-developed and well-nourished.  No distress.  HEENT: head atraumatic, normocephalic, pupils equal and reactive to light, neck supple Cardiovascular: Normal rate, regular rhythm and normal heart sounds.  No murmur heard. No BLE edema. Pulmonary/Chest: Effort normal and breath sounds normal. No respiratory distress. Abdominal: Soft.  There is no tenderness. Psychiatric: Patient has a normal mood and affect. behavior is normal. Judgment and thought content normal.  Hair: thinning on top of scalp   PHQ2/9: Depression screen [facility] 2/9 05/07/2021 04/02/2021 10/19/2020 05/07/2020 05/03/2020  Decreased Interest 0 0 0 0 0  Down, Depressed, Hopeless 0 0 0 0 0  PHQ - 2 Score 0 0 0 0 0  Altered sleeping - - - 0 -  Tired, decreased energy - - - 0 -  Change in appetite - - - 0 -  Feeling bad or failure about yourself  - - - 0 -  Trouble concentrating - - - 0 -  Moving slowly or fidgety/restless - - - 0 -  Suicidal thoughts - - - 0 -  PHQ-9 Score - - - 0 -  Difficult doing work/chores - - - - -    phq 9 is negative   Fall Risk: Fall Risk  05/07/2021 04/02/2021 10/19/2020 05/07/2020 05/03/2020  Falls in the past year? 0 0 0 0 0  Number falls in past yr: 0 0 0 0 0  Injury with Fall? 0 0 0 0 0  Risk for fall due to : No Fall Risks - - - Impaired vision  Risk for fall due to: Comment - - - - -  Follow up Falls prevention discussed Falls evaluation completed - - Falls prevention discussed    Assessment & Plan  1. Atherosclerosis of aorta ([facility])  Recheck lipid panel   2. Dyslipidemia  - Lipid panel  3. Vitamin D deficiency  Vitamin D level   4. Age-related osteoporosis without current pathological fracture  - DG Bone Density; Future - VITAMIN D 25 Hydroxy (Vit-D Deficiency, Fractures) - COMPLETE  METABOLIC PANEL WITH GFR  5. COVID-19 [place] manifesting chronic fatigue  - Sedimentation rate - C-reactive protein  6. Hair loss  - TSH - Vitamin B12 - CBC with Differential/Platelet  7. Viral wart on finger  She will try topical medication

## 2021-05-06 NOTE — Patient Instructions (Signed)
Preventive Care 71 Years and Older, Female Preventive care refers to lifestyle choices and visits with your health care provider that can promote health and wellness. This includes: A yearly physical exam. This is also called an annual wellness visit. Regular dental and eye exams. Immunizations. Screening for certain conditions. Healthy lifestyle choices, such as: Eating a healthy diet. Getting regular exercise. Not using drugs or products that contain nicotine and tobacco. Limiting alcohol use. What can I expect for my preventive care visit? Physical exam Your health care provider will check your: Height and weight. These may be used to calculate your BMI (body mass index). BMI is a measurement that tells if you are at a healthy weight. Heart rate and blood pressure. Body temperature. Skin for abnormal spots. Counseling Your health care provider may ask you questions about your: Past medical problems. Family's medical history. Alcohol, tobacco, and drug use. Emotional well-being. Home life and relationship well-being. Sexual activity. Diet, exercise, and sleep habits. History of falls. Memory and ability to understand (cognition). Work and work Statistician. Pregnancy and menstrual history. Access to firearms. What immunizations do I need?  Vaccines are usually given at various ages, according to a schedule. Your health care provider will recommend vaccines for you based on your age, medicalhistory, and lifestyle or other factors, such as travel or where you work. What tests do I need? Blood tests Lipid and cholesterol levels. These may be checked every 5 years, or more often depending on your overall health. Hepatitis C test. Hepatitis B test. Screening Lung cancer screening. You may have this screening every year starting at age 44 if you have a 30-pack-year history of smoking and currently smoke or have quit within the past 15 years. Colorectal cancer screening. All  adults should have this screening starting at age 39 and continuing until age 65. Your health care provider may recommend screening at age 61 if you are at increased risk. You will have tests every 1-10 years, depending on your results and the type of screening test. Diabetes screening. This is done by checking your blood sugar (glucose) after you have not eaten for a while (fasting). You may have this done every 1-3 years. Mammogram. This may be done every 1-2 years. Talk with your health care provider about how often you should have regular mammograms. Abdominal aortic aneurysm (AAA) screening. You may need this if you are a current or former smoker. BRCA-related cancer screening. This may be done if you have a family history of breast, ovarian, tubal, or peritoneal cancers. Other tests STD (sexually transmitted disease) testing, if you are at risk. Bone density scan. This is done to screen for osteoporosis. You may have this done starting at age 54. Talk with your health care provider about your test results, treatment options,and if necessary, the need for more tests. Follow these instructions at home: Eating and drinking  Eat a diet that includes fresh fruits and vegetables, whole grains, lean protein, and low-fat dairy products. Limit your intake of foods with high amounts of sugar, saturated fats, and salt. Take vitamin and mineral supplements as recommended by your health care provider. Do not drink alcohol if your health care provider tells you not to drink. If you drink alcohol: Limit how much you have to 0-1 drink a day. Be aware of how much alcohol is in your drink. In the [place] one drink equals one 12 oz bottle of beer (355 mL), one 5 oz glass of wine (148 mL), or one 1  oz glass of hard liquor (44 mL).  Lifestyle Take daily care of your teeth and gums. Brush your teeth every morning and night with fluoride toothpaste. Floss one time each day. Stay active. Exercise for at  least 30 minutes 5 or more days each week. Do not use any products that contain nicotine or tobacco, such as cigarettes, e-cigarettes, and chewing tobacco. If you need help quitting, ask your health care provider. Do not use drugs. If you are sexually active, practice safe sex. Use a condom or other form of protection in order to prevent STIs (sexually transmitted infections). Talk with your health care provider about taking a low-dose aspirin or statin. Find healthy ways to cope with stress, such as: Meditation, yoga, or listening to music. Journaling. Talking to a trusted person. Spending time with friends and family. Safety Always wear your seat belt while driving or riding in a vehicle. Do not drive: If you have been drinking alcohol. Do not ride with someone who has been drinking. When you are tired or distracted. While texting. Wear a helmet and other protective equipment during sports activities. If you have firearms in your house, make sure you follow all gun safety procedures. What's next? Visit your health care provider once a year for an annual wellness visit. Ask your health care provider how often you should have your eyes and teeth checked. Stay up to date on all vaccines. This information is not intended to replace advice given to you by your health care provider. Make sure you discuss any questions you have with your healthcare provider. Document Revised: 10/24/2020 Document Reviewed: 10/28/2018 Elsevier Patient Education  2022 [employer].

## 2021-05-07 ENCOUNTER — Encounter: Payer: Self-pay | Admitting: Family Medicine

## 2021-05-07 ENCOUNTER — Other Ambulatory Visit: Payer: Self-pay

## 2021-05-07 ENCOUNTER — Ambulatory Visit (INDEPENDENT_AMBULATORY_CARE_PROVIDER_SITE_OTHER): Payer: PPO | Admitting: Family Medicine

## 2021-05-07 ENCOUNTER — Ambulatory Visit (INDEPENDENT_AMBULATORY_CARE_PROVIDER_SITE_OTHER): Payer: PPO

## 2021-05-07 VITALS — BP 132/84 | HR 54 | Temp 98.1°F | Resp 15 | Ht 66.0 in | Wt 141.5 lb

## 2021-05-07 VITALS — BP 132/84 | HR 59 | Temp 98.7°F | Resp 16 | Ht 66.0 in | Wt 141.0 lb

## 2021-05-07 DIAGNOSIS — R5382 Chronic fatigue, unspecified: Secondary | ICD-10-CM | POA: Diagnosis not present

## 2021-05-07 DIAGNOSIS — B079 Viral wart, unspecified: Secondary | ICD-10-CM | POA: Diagnosis not present

## 2021-05-07 DIAGNOSIS — U099 Post covid-19 condition, unspecified: Secondary | ICD-10-CM | POA: Diagnosis not present

## 2021-05-07 DIAGNOSIS — M810 Age-related osteoporosis without current pathological fracture: Secondary | ICD-10-CM | POA: Diagnosis not present

## 2021-05-07 DIAGNOSIS — G9332 Myalgic encephalomyelitis/chronic fatigue syndrome: Secondary | ICD-10-CM

## 2021-05-07 DIAGNOSIS — Z1231 Encounter for screening mammogram for malignant neoplasm of breast: Secondary | ICD-10-CM | POA: Diagnosis not present

## 2021-05-07 DIAGNOSIS — I700 Atherosclerosis of aorta: Secondary | ICD-10-CM | POA: Diagnosis not present

## 2021-05-07 DIAGNOSIS — L659 Nonscarring hair loss, unspecified: Secondary | ICD-10-CM

## 2021-05-07 DIAGNOSIS — E559 Vitamin D deficiency, unspecified: Secondary | ICD-10-CM

## 2021-05-07 DIAGNOSIS — Z0000 Encounter for general adult medical examination without abnormal findings: Secondary | ICD-10-CM | POA: Diagnosis not present

## 2021-05-07 DIAGNOSIS — E785 Hyperlipidemia, unspecified: Secondary | ICD-10-CM | POA: Diagnosis not present

## 2021-05-07 NOTE — Patient Instructions (Signed)
Denise Perez , Thank you for taking time to come for your Medicare Wellness Visit. I appreciate your ongoing commitment to your health goals. Please review the following plan we discussed and let me know if I can assist you in the future.   Screening recommendations/referrals: Colonoscopy: done 06/29/19. Repeat in 2025. Mammogram: done 05/31/20. Please call [phone] to schedule your mammogram.  Bone Density: done 05/26/18 Recommended yearly ophthalmology/optometry visit for glaucoma screening and checkup Recommended yearly dental visit for hygiene and checkup  Vaccinations: Influenza vaccine: done 08/03/20 Pneumococcal vaccine: done 04/22/18 Tdap vaccine: done 03/08/13 Shingles vaccine: done 06/28/18 & 11/12/18   Covid-19: done 12/12/19, 01/09/20 & 10/15/20  Advanced directives: Please bring a copy of your health care power of attorney and living will to the office at your convenience.   Conditions/risks identified: Keep up the great work!  Next appointment: Follow up in one year for your annual wellness visit    Preventive Care 65 Years and Older, Female Preventive care refers to lifestyle choices and visits with your health care provider that can promote health and wellness. What does preventive care include? A yearly physical exam. This is also called an annual well check. Dental exams once or twice a year. Routine eye exams. Ask your health care provider how often you should have your eyes checked. Personal lifestyle choices, including: Daily care of your teeth and gums. Regular physical activity. Eating a healthy diet. Avoiding tobacco and drug use. Limiting alcohol use. Practicing safe sex. Taking low-dose aspirin every day. Taking vitamin and mineral supplements as recommended by your health care provider. What happens during an annual well check? The services and screenings done by your health care provider during your annual well check will depend on your age, overall  health, lifestyle risk factors, and family history of disease. Counseling  Your health care provider may ask you questions about your: Alcohol use. Tobacco use. Drug use. Emotional well-being. Home and relationship well-being. Sexual activity. Eating habits. History of falls. Memory and ability to understand (cognition). Work and work Statistician. Reproductive health. Screening  You may have the following tests or measurements: Height, weight, and BMI. Blood pressure. Lipid and cholesterol levels. These may be checked every 5 years, or more frequently if you are over 34 years old. Skin check. Lung cancer screening. You may have this screening every year starting at age 52 if you have a 30-pack-year history of smoking and currently smoke or have quit within the past 15 years. Fecal occult blood test (FOBT) of the stool. You may have this test every year starting at age 58. Flexible sigmoidoscopy or colonoscopy. You may have a sigmoidoscopy every 5 years or a colonoscopy every 10 years starting at age 11. Hepatitis C blood test. Hepatitis B blood test. Sexually transmitted disease (STD) testing. Diabetes screening. This is done by checking your blood sugar (glucose) after you have not eaten for a while (fasting). You may have this done every 1-3 years. Bone density scan. This is done to screen for osteoporosis. You may have this done starting at age 90. Mammogram. This may be done every 1-2 years. Talk to your health care provider about how often you should have regular mammograms. Talk with your health care provider about your test results, treatment options, and if necessary, the need for more tests. Vaccines  Your health care provider may recommend certain vaccines, such as: Influenza vaccine. This is recommended every year. Tetanus, diphtheria, and acellular pertussis (Tdap, Td) vaccine. You may need  a Td booster every 10 years. Zoster vaccine. You may need this after age  60. Pneumococcal 13-valent conjugate (PCV13) vaccine. One dose is recommended after age 72. Pneumococcal polysaccharide (PPSV23) vaccine. One dose is recommended after age 78. Talk to your health care provider about which screenings and vaccines you need and how often you need them. This information is not intended to replace advice given to you by your health care provider. Make sure you discuss any questions you have with your health care provider. Document Released: 11/30/2015 Document Revised: 07/23/2016 Document Reviewed: 09/04/2015 Elsevier Interactive Patient Education  2017 [place] Prevention in the Home Falls can cause injuries. They can happen to people of all ages. There are many things you can do to make your home safe and to help prevent falls. What can I do on the outside of my home? Regularly fix the edges of walkways and driveways and fix any cracks. Remove anything that might make you trip as you walk through a door, such as a raised step or threshold. Trim any bushes or trees on the path to your home. Use bright outdoor lighting. Clear any walking paths of anything that might make someone trip, such as rocks or tools. Regularly check to see if handrails are loose or broken. Make sure that both sides of any steps have handrails. Any raised decks and porches should have guardrails on the edges. Have any leaves, snow, or ice cleared regularly. Use sand or salt on walking paths during winter. Clean up any spills in your garage right away. This includes oil or grease spills. What can I do in the bathroom? Use night lights. Install grab bars by the toilet and in the tub and shower. Do not use towel bars as grab bars. Use non-skid mats or decals in the tub or shower. If you need to sit down in the shower, use a plastic, non-slip stool. Keep the floor dry. Clean up any water that spills on the floor as soon as it happens. Remove soap buildup in the tub or shower  regularly. Attach bath mats securely with double-sided non-slip rug tape. Do not have throw rugs and other things on the floor that can make you trip. What can I do in the bedroom? Use night lights. Make sure that you have a light by your bed that is easy to reach. Do not use any sheets or blankets that are too big for your bed. They should not hang down onto the floor. Have a firm chair that has side arms. You can use this for support while you get dressed. Do not have throw rugs and other things on the floor that can make you trip. What can I do in the kitchen? Clean up any spills right away. Avoid walking on wet floors. Keep items that you use a lot in easy-to-reach places. If you need to reach something above you, use a strong step stool that has a grab bar. Keep electrical cords out of the way. Do not use floor polish or wax that makes floors slippery. If you must use wax, use non-skid floor wax. Do not have throw rugs and other things on the floor that can make you trip. What can I do with my stairs? Do not leave any items on the stairs. Make sure that there are handrails on both sides of the stairs and use them. Fix handrails that are broken or loose. Make sure that handrails are as long as the stairways. Check  any carpeting to make sure that it is firmly attached to the stairs. Fix any carpet that is loose or worn. Avoid having throw rugs at the top or bottom of the stairs. If you do have throw rugs, attach them to the floor with carpet tape. Make sure that you have a light switch at the top of the stairs and the bottom of the stairs. If you do not have them, ask someone to add them for you. What else can I do to help prevent falls? Wear shoes that: Do not have high heels. Have rubber bottoms. Are comfortable and fit you well. Are closed at the toe. Do not wear sandals. If you use a stepladder: Make sure that it is fully opened. Do not climb a closed stepladder. Make sure that  both sides of the stepladder are locked into place. Ask someone to hold it for you, if possible. Clearly mark and make sure that you can see: Any grab bars or handrails. First and last steps. Where the edge of each step is. Use tools that help you move around (mobility aids) if they are needed. These include: Canes. Walkers. Scooters. Crutches. Turn on the lights when you go into a dark area. Replace any light bulbs as soon as they burn out. Set up your furniture so you have a clear path. Avoid moving your furniture around. If any of your floors are uneven, fix them. If there are any pets around you, be aware of where they are. Review your medicines with your doctor. Some medicines can make you feel dizzy. This can increase your chance of falling. Ask your doctor what other things that you can do to help prevent falls. This information is not intended to replace advice given to you by your health care provider. Make sure you discuss any questions you have with your health care provider. Document Released: 08/30/2009 Document Revised: 04/10/2016 Document Reviewed: 12/08/2014 Elsevier Interactive Patient Education  2017 [employer].

## 2021-05-07 NOTE — Progress Notes (Signed)
Subjective:   Denise Perez is a 70 y.o. female who presents for Medicare Annual (Subsequent) preventive examination.  Review of Systems     Cardiac Risk Factors include: advanced age (>29men, >18 women)     Objective:    Today's Vitals   05/07/21 0832  BP: 132/84  Pulse: (!) 54  Resp: 15  Temp: 98.1 F (36.7 C)  TempSrc: Oral  SpO2: 98%  Weight: 141 lb 8 oz (64.2 kg)  Height: 5\' 6"  (1.676 m)   Body mass index is 22.84 kg/m.  Advanced Directives 05/07/2021 05/03/2020 06/29/2019 04/29/2019 04/22/2018 06/03/2017 02/27/2017  Does Patient Have a Medical Advance Directive? Yes Yes Yes Yes Yes No No;Yes  Type of Paramedic of [place];Living will [place];Living will [place];Out of facility DNR (pink MOST or yellow form);Living will Living will;Healthcare Power of [place];Living will - [place];Living will  Does patient want to make changes to medical advance directive? - - - - - - -  Copy of [place] in Chart? No - copy requested No - copy requested No - copy requested No - copy requested No - copy requested - No - copy requested    Current Medications (verified) Outpatient Encounter Medications as of 05/07/2021  Medication Sig   Cholecalciferol (VITAMIN D) 2000 units CAPS Take 1 capsule (2,000 Units total) by mouth daily.   [DISCONTINUED] aspirin EC 81 MG tablet Take 1 tablet (81 mg total) by mouth daily.   No facility-administered encounter medications on file as of 05/07/2021.    Allergies (verified) Prednisone   History: Past Medical History:  Diagnosis Date   Cervical disc disease    Cervical neuropathy    GERD (gastroesophageal reflux disease)    Osteoporosis    Tietze's disease    Past Surgical History:  Procedure Laterality Date   COLONOSCOPY WITH PROPOFOL N/A 08/13/2015   Procedure: COLONOSCOPY WITH PROPOFOL;  Surgeon: Hulen Luster, MD;  Location: [facility] ENDOSCOPY;  Service: Gastroenterology;  Laterality: N/A;   COLONOSCOPY WITH PROPOFOL N/A 06/29/2019   Procedure: COLONOSCOPY WITH PROPOFOL;  Surgeon: Toledo, Benay Pike, MD;  Location: [facility] ENDOSCOPY;  Service: Gastroenterology;  Laterality: N/A;   ESOPHAGOGASTRODUODENOSCOPY (EGD) WITH PROPOFOL     ESOPHAGOGASTRODUODENOSCOPY (EGD) WITH PROPOFOL N/A 06/29/2019   Procedure: ESOPHAGOGASTRODUODENOSCOPY (EGD) WITH PROPOFOL;  Surgeon: Toledo, Benay Pike, MD;  Location: [facility] ENDOSCOPY;  Service: Gastroenterology;  Laterality: N/A;   GUM SURGERY     Family History  Problem Relation Age of Onset   Cancer Father        Colon   Breast cancer Neg Hx    Social History   Socioeconomic History   Marital status: Married    Spouse name: Glendell Docker   Number of children: 0   Years of education: Not on file   Highest education level: Bachelor's degree (e.g., BA, AB, BS)  Occupational History   Occupation: Retired  Tobacco Use   Smoking status: Former    Packs/day: 1.50    Years: 15.00    Pack years: 22.50    Types: Cigarettes    Start date: 11/17/1970    Quit date: 11/17/1985    Years since quitting: 35.4   Smokeless tobacco: Never   Tobacco comments:    smoking cessation materials not required  Vaping Use   Vaping Use: Never used  Substance and Sexual Activity   Alcohol use: Not Currently  Alcohol/week: 0.0 standard drinks    Comment: 5 glasses of wine a year   Drug use: No   Sexual activity: Not Currently    Partners: Male  Other Topics Concern   Not on file  Social History Narrative   Not on file   Social Determinants of Health   Financial Resource Strain: Low Risk    Difficulty of Paying Living Expenses: Not hard at all  Food Insecurity: No Food Insecurity   Worried About Charity fundraiser in the Last Year: Never true   [place] in the Last Year: Never true  Transportation Needs: No Transportation Needs   Lack of Transportation (Medical): No   Lack of  Transportation (Non-Medical): No  Physical Activity: Sufficiently Active   Days of Exercise per Week: 5 days   Minutes of Exercise per Session: 50 min  Stress: No Stress Concern Present   Feeling of Stress : Not at all  Social Connections: Socially Integrated   Frequency of Communication with Friends and Family: More than three times a week   Frequency of Social Gatherings with Friends and Family: More than three times a week   Attends Religious Services: More than 4 times per year   Active Member of [employer] or Organizations: Yes   Attends Music therapist: More than 4 times per year   Marital Status: Married    Tobacco Counseling Counseling given: Not Answered Tobacco comments: smoking cessation materials not required   Clinical Intake:  Pre-visit preparation completed: Yes  Pain : No/denies pain     BMI - recorded: 22.84 Nutritional Status: BMI of 19-24  Normal Nutritional Risks: None Diabetes: No  How often do you need to have someone help you when you read instructions, pamphlets, or other written materials from your doctor or pharmacy?: 1 - Never    Interpreter Needed?: No  Information entered by :: Clemetine Marker LPN   Activities of Daily Living In your present state of health, do you have any difficulty performing the following activities: 05/07/2021 04/02/2021  Hearing? N N  Comment declines hearing aids -  Vision? N N  Difficulty concentrating or making decisions? N N  Walking or climbing stairs? N N  Dressing or bathing? N N  Doing errands, shopping? N N  Preparing Food and eating ? N -  Using the Toilet? N -  In the past six months, have you accidently leaked urine? N -  Do you have problems with loss of bowel control? N -  Managing your Medications? N -  Managing your Finances? N -  Housekeeping or managing your Housekeeping? N -  Some recent data might be hidden    Patient Care Team: Steele Sizer, MD as PCP - General (Family  Medicine) Thelma Comp, [place] (Optometry) Winfield Rast, [place] as Consulting Physician (Chiropractic Medicine) Efrain Sella, MD as Consulting Physician (Gastroenterology) Dasher, Rayvon Char, MD (Dermatology)  Indicate any recent Medical Services you may have received from other than [facility] providers in the past year (date may be approximate).     Assessment:   This is a routine wellness examination for [place].  Hearing/Vision screen Hearing Screening - Comments:: Pt denies any hearing loss Vision Screening - Comments:: Annual vision screenings done at [facility]  Dietary issues and exercise activities discussed: Current Exercise Habits: Home exercise routine;Structured exercise class, Type of exercise: walking;calisthenics;Other - see comments;treadmill (water aerobics), Time (Minutes): 50, Frequency (Times/Week): 5, Weekly Exercise (Minutes/Week): 250, Intensity: Moderate,  Exercise limited by: None identified   Goals Addressed   None    Depression Screen PHQ 2/9 Scores 05/07/2021 04/02/2021 10/19/2020 05/07/2020 05/03/2020 05/16/2019 04/29/2019  PHQ - 2 Score 0 0 0 0 0 0 0  PHQ- 9 Score - - - 0 - 1 -    Fall Risk Fall Risk  05/07/2021 04/02/2021 10/19/2020 05/07/2020 05/03/2020  Falls in the past year? 0 0 0 0 0  Number falls in past yr: 0 0 0 0 0  Injury with Fall? 0 0 0 0 0  Risk for fall due to : No Fall Risks - - - Impaired vision  Risk for fall due to: Comment - - - - -  Follow up Falls prevention discussed Falls evaluation completed - - Falls prevention discussed    FALL RISK PREVENTION PERTAINING TO THE HOME:  Any stairs in or around the home? Yes  If so, are there any without handrails? No  Home free of loose throw rugs in walkways, pet beds, electrical cords, etc? Yes  Adequate lighting in your home to reduce risk of falls? Yes   ASSISTIVE DEVICES UTILIZED TO PREVENT FALLS:  Life alert? No  Use of a cane, walker or w/c? No  Grab bars in the bathroom? No  Shower  chair or bench in shower? No  Elevated toilet seat or a handicapped toilet? No   TIMED UP AND GO:  Was the test performed? Yes .  Length of time to ambulate 10 feet: 4 sec.   Gait steady and fast without use of assistive device  Cognitive Function: Normal cognitive status assessed by direct observation by this Nurse Health Advisor. No abnormalities found.       6CIT Screen 04/29/2019 04/22/2018 02/27/2017  What Year? 0 points 0 points 0 points  What month? 0 points 0 points 0 points  What time? 0 points 0 points 0 points  Count back from 20 0 points 0 points 0 points  Months in reverse 0 points 0 points 0 points  Repeat phrase 0 points 0 points 0 points  Total Score 0 0 0    Immunizations Immunization History  Administered Date(s) Administered   Influenza, High Dose Seasonal PF 08/14/2017, 08/24/2018, 08/03/2020   Influenza-Unspecified 08/08/2014, 08/17/2017, 08/24/2018   Moderna SARS-COV2 Booster Vaccination 10/15/2020   Moderna Sars-Covid-2 Vaccination 12/12/2019, 01/09/2020   Pneumococcal Conjugate-13 04/22/2018   Pneumococcal Polysaccharide-23 05/28/2016   Tdap 03/08/2013   Zoster Recombinat (Shingrix) 06/28/2018, 11/12/2018   Zoster, Live 03/08/2013    TDAP status: Up to date  Flu Vaccine status: Up to date  Pneumococcal vaccine status: Up to date  Covid-19 vaccine status: Completed vaccines  Qualifies for Shingles Vaccine? Yes   Zostavax completed Yes   Shingrix Completed?: Yes  Screening Tests Health Maintenance  Topic Date Due   COVID-19 Vaccine (4 - Booster for Moderna series) 02/12/2021   MAMMOGRAM  05/31/2021   INFLUENZA VACCINE  06/17/2021   TETANUS/TDAP  03/09/2023   COLONOSCOPY (Pts 45-80yrs Insurance coverage will need to be confirmed)  06/28/2024   DEXA SCAN  Completed   Hepatitis C Screening  Completed   PNA vac Low Risk Adult  Completed   Zoster Vaccines- Shingrix  Completed   HPV VACCINES  Aged Out    Health Maintenance  Health  Maintenance Due  Topic Date Due   COVID-19 Vaccine (4 - Booster for Moderna series) 02/12/2021    Colorectal cancer screening: Type of screening: Colonoscopy. Completed 06/29/19. Repeat every 5 years  Mammogram status: Completed 05/31/20. Repeat every year. Ordered today.   Bone Density status: Completed 05/26/18. Results reflect: Bone density results: OSTEOPOROSIS. Repeat every 2 years. Pt declines repeat screening at this time.   Lung Cancer Screening: (Low Dose CT Chest recommended if Age 27-80 years, 30 pack-year currently smoking OR have quit w/in 15years.) does not qualify.   Additional Screening:  Hepatitis C Screening: does qualify; Completed 05/28/16  Vision Screening: Recommended annual ophthalmology exams for early detection of glaucoma and other disorders of the eye. Is the patient up to date with their annual eye exam?  Yes  Who is the provider or what is the name of the office in which the patient attends annual eye exams? [facility].   Dental Screening: Recommended annual dental exams for proper oral hygiene  Community Resource Referral / Chronic Care Management: CRR required this visit?  No   CCM required this visit?  No      Plan:     I have personally reviewed and noted the following in the patient's chart:   Medical and social history Use of alcohol, tobacco or illicit drugs  Current medications and supplements including opioid prescriptions.  Functional ability and status Nutritional status Physical activity Advanced directives List of other physicians Hospitalizations, surgeries, and ER visits in previous 12 months Vitals Screenings to include cognitive, depression, and falls Referrals and appointments  In addition, I have reviewed and discussed with patient certain preventive protocols, quality metrics, and best practice recommendations. A written personalized care plan for preventive services as well as general preventive health  recommendations were provided to patient.     Clemetine Marker, LPN   01/15/6009   Nurse Notes: pt diagnosed with Covid in May, completed course of paxlovid. Pt states she felt like she had a mild case with mostly cold symptoms. She has resumed normal activities but still feeling easily fatigued and occasional shortness of breath upon exertion.   Pt also wants to discuss possible wart on left middle finger and c/o hair thinning and wants to review topical hair regrowth treatment purchased OTC.

## 2021-05-08 LAB — COMPLETE METABOLIC PANEL WITH GFR
AG Ratio: 1.8 (calc) (ref 1.0–2.5)
ALT: 12 U/L (ref 6–29)
AST: 19 U/L (ref 10–35)
Albumin: 4.2 g/dL (ref 3.6–5.1)
Alkaline phosphatase (APISO): 87 U/L (ref 37–153)
BUN: 13 mg/dL (ref 7–25)
CO2: 28 mmol/L (ref 20–32)
Calcium: 9.6 mg/dL (ref 8.6–10.4)
Chloride: 108 mmol/L (ref 98–110)
Creat: 0.85 mg/dL (ref 0.60–0.93)
GFR, Est African American: 80 mL/min/{1.73_m2} (ref >=60)
GFR, Est Non African American: 69 mL/min/{1.73_m2} (ref >=60)
Globulin: 2.3 g/dL (calc) (ref 1.9–3.7)
Glucose, Bld: 86 mg/dL (ref 65–99)
Potassium: 5.9 mmol/L — ABNORMAL HIGH (ref 3.5–5.3)
Sodium: 143 mmol/L (ref 135–146)
Total Bilirubin: 0.5 mg/dL (ref 0.2–1.2)
Total Protein: 6.5 g/dL (ref 6.1–8.1)

## 2021-05-08 LAB — LIPID PANEL
Cholesterol: 233 mg/dL — ABNORMAL HIGH (ref <200)
HDL: 77 mg/dL (ref >=50)
LDL Cholesterol (Calc): 139 mg/dL (calc) — ABNORMAL HIGH
Non-HDL Cholesterol (Calc): 156 mg/dL (calc) — ABNORMAL HIGH (ref <130)
Total CHOL/HDL Ratio: 3 (calc) (ref <5.0)
Triglycerides: 78 mg/dL (ref <150)

## 2021-05-08 LAB — CBC WITH DIFFERENTIAL/PLATELET
Absolute Monocytes: 366 cells/uL (ref 200–950)
Basophils Absolute: 32 cells/uL (ref 0–200)
Basophils Relative: 0.6 %
Eosinophils Absolute: 80 cells/uL (ref 15–500)
Eosinophils Relative: 1.5 %
HCT: 42.3 % (ref 35.0–45.0)
Hemoglobin: 14.3 g/dL (ref 11.7–15.5)
Lymphs Abs: 1675 cells/uL (ref 850–3900)
MCH: 32.1 pg (ref 27.0–33.0)
MCHC: 33.8 g/dL (ref 32.0–36.0)
MCV: 95.1 fL (ref 80.0–100.0)
MPV: 9.9 fL (ref 7.5–12.5)
Monocytes Relative: 6.9 %
Neutro Abs: 3148 cells/uL (ref 1500–7800)
Neutrophils Relative %: 59.4 %
Platelets: 218 10*3/uL (ref 140–400)
RBC: 4.45 10*6/uL (ref 3.80–5.10)
RDW: 13.4 % (ref 11.0–15.0)
Total Lymphocyte: 31.6 %
WBC: 5.3 10*3/uL (ref 3.8–10.8)

## 2021-05-08 LAB — C-REACTIVE PROTEIN: CRP: 1.8 mg/L (ref <8.0)

## 2021-05-08 LAB — VITAMIN D 25 HYDROXY (VIT D DEFICIENCY, FRACTURES): Vit D, 25-Hydroxy: 42 ng/mL (ref 30–100)

## 2021-05-08 LAB — SEDIMENTATION RATE: Sed Rate: 11 mm/h (ref 0–30)

## 2021-05-08 LAB — VITAMIN B12: Vitamin B-12: 414 pg/mL (ref 200–1100)

## 2021-05-08 LAB — TSH: TSH: 0.73 mIU/L (ref 0.40–4.50)

## 2021-06-06 ENCOUNTER — Other Ambulatory Visit: Payer: Self-pay

## 2021-06-06 ENCOUNTER — Ambulatory Visit
Admission: RE | Admit: 2021-06-06 | Discharge: 2021-06-06 | Disposition: A | Discharge status: Home / Self Care | Payer: PPO | Source: Ambulatory Visit | Attending: Family Medicine | Admitting: Family Medicine

## 2021-06-06 DIAGNOSIS — Z1231 Encounter for screening mammogram for malignant neoplasm of breast: Secondary | ICD-10-CM | POA: Insufficient documentation

## 2021-06-06 DIAGNOSIS — M810 Age-related osteoporosis without current pathological fracture: Secondary | ICD-10-CM | POA: Insufficient documentation

## 2021-06-06 DIAGNOSIS — Z780 Asymptomatic menopausal state: Secondary | ICD-10-CM | POA: Diagnosis not present

## 2021-06-24 ENCOUNTER — Ambulatory Visit: Payer: Self-pay

## 2021-06-24 ENCOUNTER — Other Ambulatory Visit: Payer: Self-pay

## 2021-06-24 ENCOUNTER — Ambulatory Visit (INDEPENDENT_AMBULATORY_CARE_PROVIDER_SITE_OTHER): Payer: PPO | Admitting: Family Medicine

## 2021-06-24 VITALS — BP 118/72 | HR 98 | Temp 98.8°F | Resp 14 | Ht 66.0 in | Wt 139.6 lb

## 2021-06-24 DIAGNOSIS — R0789 Other chest pain: Secondary | ICD-10-CM | POA: Diagnosis not present

## 2021-06-24 DIAGNOSIS — E875 Hyperkalemia: Secondary | ICD-10-CM | POA: Diagnosis not present

## 2021-06-24 LAB — BASIC METABOLIC PANEL
BUN: 13 mg/dL (ref 7–25)
CO2: 26 mmol/L (ref 20–32)
Calcium: 9.4 mg/dL (ref 8.6–10.4)
Chloride: 106 mmol/L (ref 98–110)
Creat: 0.75 mg/dL (ref 0.60–1.00)
Glucose, Bld: 92 mg/dL (ref 65–99)
Potassium: 4.3 mmol/L (ref 3.5–5.3)
Sodium: 141 mmol/L (ref 135–146)

## 2021-06-24 NOTE — Assessment & Plan Note (Signed)
Intermittent, self-limited, associated with exertion. EKG without notable abnormalities. Possible related to long-COVID given onset of symptoms however with associated shortness of breath, substernal and associated with exertion, some concern for cardiac etiology. Will refer to cardiology for evaluation and potential stress testing. Emergency precautions discussed.

## 2021-06-24 NOTE — Patient Instructions (Addendum)
It was great to see you!  Our plans for today:  - Let us know if you don't hear about a cardiology appointment in the next few weeks. - See below for information about prolia.   We are checking some labs today, we will release these results to your MyChart.  Take care and seek immediate care sooner if you develop any concerns.   Dr. Pasty Arch Names: [place] Prolia; Maryjean Ka Names: [place] Prolia; Delton See What is this drug used for?  It is used to treat soft, brittle bones (osteoporosis).  It is used for bone growth.  It is used when treating some cancers.  It is used to treat high calcium levels in patients with cancer.  It may be given to you for other reasons. Talk with the doctor. What do I need to tell my doctor BEFORE I take this drug?  If you are allergic to this drug; any part of this drug; or any other drugs, foods, or substances. Tell your doctor about the allergy and what signs you had.  If you have low calcium levels.  If you are using another drug that has the same drug in it.  If you are pregnant or may be pregnant. Do not take this drug if you are pregnant.  If you are breast-feeding or plan to breast-feed. This is not a list of all drugs or health problems that interact with this drug. Tell your doctor and pharmacist about all of your drugs (prescription or OTC, natural products, vitamins) and health problems. You must check to make sure that it is safe for you to take this drug with all of your drugs and health problems. Do not start, stop, or change the dose of any drug without checking with your doctor. What are some things I need to know or do while I take this drug? All products:  Tell all of your health care providers that you take this drug. This includes your doctors, nurses, pharmacists, and dentists.  This drug may raise the chance of a broken leg. Talk with the doctor.  If treatment with this drug is stopped, skipped, or delayed, the chance of a broken  bone is raised. This includes bones in the spine. The chance of having more than 1 broken bone in the spine is raised if you have ever had a broken bone in your spine. Do not stop, skip, or delay treatment with this drug without talking to your doctor.  Have a bone density test as you have been told by your doctor. Talk with your doctor.  Have blood work checked as you have been told by the doctor. Talk with the doctor.  Take calcium and vitamin D as you were told by your doctor.  Have a dental exam before starting this drug.  Take good care of your teeth. See a dentist often.  This drug may cause harm to an unborn baby. A pregnancy test will be done before you start this drug to show that you are NOT pregnant.  If you may become pregnant, you must use birth control while taking this drug and for some time after the last dose. Ask your doctor how long to use birth control. If you get pregnant, call your doctor right away. Xgeva:  Very low blood calcium levels have happened with this drug. Sometimes, this has been deadly. If you have questions, talk with the doctor.  High calcium levels have happened after this drug was stopped in  people whose bones were still growing and people with giant cell bone tumor. Call your doctor right away if you have signs of high calcium levels like weakness, confusion, feeling tired, headache, upset stomach or throwing up, constipation, or bone pain. Prolia:  Very bad infections have been reported with use of this drug. If you have any infection, are taking antibiotics now or in the recent past, or have many infections, talk with your doctor.  Rarely, a pancreas problem (pancreatitis) has happened with this drug. This has included 1 death. Call your doctor right away if you have signs of pancreatitis like very bad stomach pain, very bad back pain, or very bad upset stomach or throwing up.  This drug may lower blood calcium levels. If you already have low blood calcium, it  may get worse with this drug. Sometimes, blood calcium levels have stayed low for weeks or months after use of this drug. Talk with the doctor.  High cholesterol has happened with this drug. If you have questions, talk with the doctor.  This drug is not approved for use in children. High calcium levels have been reported in some children, which sometimes needed to be treated in the hospital. Bone and teeth growth may also be affected. Talk with the doctor. What are some side effects that I need to call my doctor about right away? WARNING/CAUTION: Even though it may be rare, some people may have very bad and sometimes deadly side effects when taking a drug. Tell your doctor or get medical help right away if you have any of the following signs or symptoms that may be related to a very bad side effect: All products:  Signs of an allergic reaction, like rash; hives; itching; red, swollen, blistered, or peeling skin with or without fever; wheezing; tightness in the chest or throat; trouble breathing, swallowing, or talking; unusual hoarseness; or swelling of the mouth, face, lips, tongue, or throat.  Signs of low calcium levels like muscle cramps or spasms, numbness and tingling, or seizures.  Mouth sores.  Swelling in the arms or legs.  Feeling very tired or weak.  Any new or strange groin, hip, or thigh pain.  Very bad bone, joint, or muscle pain.  Shortness of breath.  This drug may cause jawbone problems. The risk may be higher with longer use, cancer, dental problems, ill-fitting dentures, anemia, blood clotting problems, or infection. It may also be higher if you have dental work, chemo, radiation, or take other drugs that may cause jawbone problems. Many drugs can do this. Talk with your doctor if any of these apply to you, or if you have questions. Call your doctor right away if you have jaw swelling or pain. Xgeva:  Signs of low phosphate levels like change in eyesight, feeling confused, mood  changes, muscle pain or weakness, shortness of breath or other breathing problems, or trouble swallowing.  Very bad dizziness or passing out.  Any unexplained bruising or bleeding. Prolia:  Signs of infection like fever, chills, very bad sore throat, ear or sinus pain, cough, more sputum or change in color of sputum, pain with passing urine, mouth sores, or wound that will not heal.  Signs of skin infection like oozing, heat, swelling, redness, or pain.  Signs of high or low blood pressure like very bad headache or dizziness, passing out, or change in eyesight.  Small bumps or patches on your skin, dry skin, or if your skin feels like leather.  Bladder pain or pain when  passing urine or change in how much urine is passed.  Passing urine more often. What are some other side effects of this drug? All drugs may cause side effects. However, many people have no side effects or only have minor side effects. Call your doctor or get medical help if any of these side effects or any other side effects bother you or do not go away: All products:  Back pain.  Headache.  Signs of a common cold.  Pain in arms or legs.  Muscle or joint pain. Xgeva:  Constipation, diarrhea, stomach pain, upset stomach, throwing up, or feeling less hungry.  Feeling tired or weak.  Nose or throat irritation.  Tooth pain. These are not all of the side effects that may occur. If you have questions about side effects, call your doctor. Call your doctor for medical advice about side effects. You may report side effects to your national health agency. How is this drug best taken? Use this drug as ordered by your doctor. Read all information given to you. Follow all instructions closely.  It is given as a shot into the fatty part of the skin. What do I do if I miss a dose?  Call your doctor to find out what to do. How do I store and/or throw out this drug?  If you need to store this drug at home, talk with your doctor, nurse,  or pharmacist about how to store it. General drug facts  If your symptoms or health problems do not get better or if they become worse, call your doctor.  Do not share your drugs with others and do not take anyone else's drugs.  Keep all drugs in a safe place. Keep all drugs out of the reach of children and pets.  Throw away unused or expired drugs. Do not flush down a toilet or pour down a drain unless you are told to do so. Check with your pharmacist if you have questions about the best way to throw out drugs. There may be drug take-back programs in your area.  Some drugs may have another patient information leaflet. If you have any questions about this drug, please talk with your doctor, nurse, pharmacist, or other health care provider.  If you think there has been an overdose, call your poison control center or get medical care right away. Be ready to tell or show what was taken, how much, and when it happened. Last Reviewed Date 2021-04-29 Consumer Information Use and Disclaimer This generalized information is a limited summary of diagnosis, treatment, and/or medication information. It is not meant to be comprehensive and should be used as a tool to help the user understand and/or assess potential diagnostic and treatment options. It does NOT include all information about conditions, treatments, medications, side effects, or risks that may apply to a specific patient. It is not intended to be medical advice or a substitute for the medical advice, diagnosis, or treatment of a health care provider based on the health care provider's examination and assessment of a patient's specific and unique circumstances. Patients must speak with a health care provider for complete information about their health, medical questions, and treatment options, including any risks or benefits regarding use of medications. This information does not endorse any treatments or medications as safe, effective, or approved for  treating a specific patient. [employer] and its affiliates disclaim any warranty or liability relating to this information or the use thereof. The use of this information is governed by  the Terms of Use, available at https://www.wolterskluwer.com/en/know/clinical-effectiveness-terms.

## 2021-06-24 NOTE — Telephone Encounter (Signed)
Pt c/o continued chest tightening that goes across the upper chest. Pt stated the tightness does not radiate to either arm, jaw, neck or back. Pt is not sure when the first episode of tightness began. Pt stated there is no SOB, nausea, sweating when she has it. Activity does not initiate it. Pt stated the tightness can go from half a day to 2 hours. Episodes happen twice per week.  Pt concerned she has long covid or that "Paxlovid was too hard on my sytem." Pt is physically active walking on treadmill and water aerobics. Pt stated that sometimes she is ok and other times she get SOB with activity. Pt instructed if she gets worse to call back. Called Cassandra at office and was able to get pt an appointment tomorrow. Pt tearful and worried. Emotional support given.        Answer Assessment - Initial Assessment Questions 1. LOCATION: "Where does it hurt?"       Across whole upper chest  2. RADIATION: "Does the pain go anywhere else?" (e.g., into neck, jaw, arms, back)     no 3. ONSET: "When did the chest pain begin?" (Minutes, hours or days)      Unsure when it started 4. PATTERN "Does the pain come and go, or has it been constant since it started?"  "Does it get worse with exertion?"      Comes and goes- no 5. DURATION: "How long does it last" (e.g., seconds, minutes, hours)     From 2 hours to half a day 6. SEVERITY: "How bad is the pain?"  (e.g., Scale 1-10; mild, moderate, or severe)    - MILD (1-3): doesn't interfere with normal activities     - MODERATE (4-7): interferes with normal activities or awakens from sleep    - SEVERE (8-10): excruciating pain, unable to do any normal activities       mild 7. CARDIAC RISK FACTORS: "Do you have any history of heart problems or risk factors for heart disease?" (e.g., angina, prior heart attack; diabetes, high blood pressure, high cholesterol, smoker, or strong family history of heart disease)     no 8. PULMONARY RISK FACTORS: "Do you have any  history of lung disease?"  (e.g., blood clots in lung, asthma, emphysema, birth control pills)     no 9. CAUSE: "What do you think is causing the chest pain?"     Long covid  10. OTHER SYMPTOMS: "Do you have any other symptoms?" (e.g., dizziness, nausea, vomiting, sweating, fever, difficulty breathing, cough)       1 episode of nausea 2 months ago  at the food pantry and subsided after leaving the pantry  Protocols used: Chest Pain-A-AH

## 2021-06-24 NOTE — Progress Notes (Signed)
   SUBJECTIVE:   CHIEF COMPLAINT / HPI:   CHEST TIGHTNESS - dx with COVID in May and took Paxlovid. Has had intermittent SOB since then with intermittent chest tightness  - very active, goes to the gym 4-5 times per week. Water aerobics, walking 1-1.5 miles.   Duration: months Quality: tightness Location: substernal Radiation: none Episode duration: minutes to hours, self resolves Frequency: intermittent Related to exertion:  occasional Trauma: no Anxiety/recent stressors: no Aggravating factors: heat seems to make worse Alleviating factors:  Status: fluctuating Treatments attempted: vitamins Current pain status: pain free Shortness of breath: yes Cough: no Nausea:  once Diaphoresis: no Heartburn: no Palpitations: no Pleuritic pain: no Syncope: no Leg swelling: no Immobility: no  PMH History of hypertension, cholesterol, diabetes, obesity, smoking, early cardiac family history, CAD, PAD: mom and dad with ?heart problems (mom in 28s), former smoker, hyperlipidemia.   OBJECTIVE:   BP 118/72   Pulse 98   Temp 98.8 F (37.1 C) (Oral)   Resp 14   Ht '5\' 6"'$  (1.676 m)   Wt 139 lb 9.6 oz (63.3 kg)   SpO2 97%   BMI 22.53 kg/m   Gen: well appearing, in NAD Card: RRR. Chest wall nonTTP. Lungs: CTAB Ext: WWP, no edema  ASSESSMENT/PLAN:   Chest tightness Intermittent, self-limited, associated with exertion. EKG without notable abnormalities. Possible related to long-COVID given onset of symptoms however with associated shortness of breath, substernal and associated with exertion, some concern for cardiac etiology. Will refer to cardiology for evaluation and potential stress testing. Emergency precautions discussed.     Myles Gip, DO

## 2021-06-24 NOTE — Progress Notes (Deleted)
Name: Denise Perez   MRN: [id number]    DOB: 1951/09/03   Date:06/24/2021       Progress Note  Subjective  Chief Complaint  Chest Tightness  HPI  [place] COVID-19: she was diagnosed in May and took Paxlovid, her symptoms were very mild, however she states since COVID-19 she has noticed decrease in exercise tolerance. Mostly when going to gym, or walking a long distance, but also after a shower. No chest pain or palpitation. She states gradually improving but discussed referral to cardiologist if she changes her mind. She states cough resolved.   Age related osteoporosis: explained that was slight progression of osteoporosis and discussed medications options, she has been physically active - going to the gym 4-5 times a week. We will recheck bone density today    Dyslipidemia: she is only on life style modification, since last visit she had CT abdomen pelvis that showed atherosclerosis of aorta.  She declines starting statin, but has been active, healthy diet and now added Avocado . She was on aspirin but stopped a couple of months ago, discussed USPTF and she will take it only a few times a week from now on    Hair loss: she noticed thinning on top of her scalp over the past several years, she was seen by Dr. Evorn Gong and will start Minoxidil now    Patient Active Problem List   Diagnosis Date Noted   Atherosclerosis of aorta ([place]) 05/07/2020   FH: colon cancer 05/30/2019   Dyslipidemia 05/28/2016   Osteoporosis 05/28/2016   Cervical disc disease 05/28/2016   Primary osteoarthritis involving multiple joints 06/11/2015   Synovial cyst of wrist 06/11/2015    Past Surgical History:  Procedure Laterality Date   COLONOSCOPY WITH PROPOFOL N/A 08/13/2015   Procedure: COLONOSCOPY WITH PROPOFOL;  Surgeon: Hulen Luster, MD;  Location: [facility] ENDOSCOPY;  Service: Gastroenterology;  Laterality: N/A;   COLONOSCOPY WITH PROPOFOL N/A 06/29/2019   Procedure: COLONOSCOPY WITH PROPOFOL;  Surgeon: Toledo,  Benay Pike, MD;  Location: [facility] ENDOSCOPY;  Service: Gastroenterology;  Laterality: N/A;   ESOPHAGOGASTRODUODENOSCOPY (EGD) WITH PROPOFOL     ESOPHAGOGASTRODUODENOSCOPY (EGD) WITH PROPOFOL N/A 06/29/2019   Procedure: ESOPHAGOGASTRODUODENOSCOPY (EGD) WITH PROPOFOL;  Surgeon: Toledo, Benay Pike, MD;  Location: [facility] ENDOSCOPY;  Service: Gastroenterology;  Laterality: N/A;   GUM SURGERY      Family History  Problem Relation Age of Onset   Cancer Father        Colon   Breast cancer Neg Hx     Social History   Tobacco Use   Smoking status: Former    Packs/day: 1.50    Years: 15.00    Pack years: 22.50    Types: Cigarettes    Start date: 11/17/1970    Quit date: 11/17/1985    Years since quitting: 35.6   Smokeless tobacco: Never   Tobacco comments:    smoking cessation materials not required  Substance Use Topics   Alcohol use: Not Currently    Alcohol/week: 0.0 standard drinks    Comment: 5 glasses of wine a year     Current Outpatient Medications:    Cholecalciferol (VITAMIN D) 2000 units CAPS, Take 1 capsule (2,000 Units total) by mouth daily., Disp: 30 capsule, Rfl: 0   MINOXIDIL, TOPICAL, 5 % SOLN, Apply 1 each topically daily., Disp: , Rfl:   Allergies  Allergen Reactions   Prednisone Other (See Comments)    Shaking    I personally reviewed {Reviewed:14835} with the  patient/caregiver today.   ROS  ***  Objective  There were no vitals filed for this visit.  There is no height or weight on file to calculate BMI.  Physical Exam ***  Recent Results (from the past 2160 hour(s))  TSH     Status: None   Collection Time: 05/07/21 10:10 AM  Result Value Ref Range   TSH 0.73 0.40 - 4.50 mIU/L  Vitamin B12     Status: None   Collection Time: 05/07/21 10:10 AM  Result Value Ref Range   Vitamin B-12 414 200 - 1,100 pg/mL  VITAMIN D 25 Hydroxy (Vit-D Deficiency, Fractures)     Status: None   Collection Time: 05/07/21 10:10 AM  Result Value Ref Range   Vit D,  25-Hydroxy 42 30 - 100 ng/mL    Comment: Vitamin D Status         25-OH Vitamin D: . Deficiency:                    <20 ng/mL Insufficiency:             20 - 29 ng/mL Optimal:                 > or = 30 ng/mL . For 25-OH Vitamin D testing on patients on  D2-supplementation and patients for whom quantitation  of D2 and D3 fractions is required, the QuestAssureD(TM) 25-OH VIT D, (D2,D3), LC/MS/MS is recommended: order  code [phone] (patients >58yr). See Note 1 . Note 1 . For additional information, please refer to  http://education.[facility].com/faq/FAQ199  (This link is being provided for informational/ educational purposes only.)   COMPLETE METABOLIC PANEL WITH GFR     Status: Abnormal   Collection Time: 05/07/21 10:10 AM  Result Value Ref Range   Glucose, Bld 86 65 - 99 mg/dL    Comment: .            Fasting reference interval .    BUN 13 7 - 25 mg/dL   Creat 0.85 0.60 - 0.93 mg/dL    Comment: For patients >412years of age, the reference limit for Creatinine is approximately 13% higher for people identified as African-American. .    GFR, Est Non African American 69 > OR = 60 mL/min/1.746m  GFR, Est African American 80 > OR = 60 mL/min/1.7364m BUN/Creatinine Ratio NOT APPLICABLE 6 - 22 (calc)   Sodium 143 135 - 146 mmol/L   Potassium 5.9 (H) 3.5 - 5.3 mmol/L   Chloride 108 98 - 110 mmol/L   CO2 28 20 - 32 mmol/L   Calcium 9.6 8.6 - 10.4 mg/dL   Total Protein 6.5 6.1 - 8.1 g/dL   Albumin 4.2 3.6 - 5.1 g/dL   Globulin 2.3 1.9 - 3.7 g/dL (calc)   AG Ratio 1.8 1.0 - 2.5 (calc)   Total Bilirubin 0.5 0.2 - 1.2 mg/dL   Alkaline phosphatase (APISO) 87 37 - 153 U/L   AST 19 10 - 35 U/L   ALT 12 6 - 29 U/L  CBC with Differential/Platelet     Status: None   Collection Time: 05/07/21 10:10 AM  Result Value Ref Range   WBC 5.3 3.8 - 10.8 Thousand/uL   RBC 4.45 3.80 - 5.10 Million/uL   Hemoglobin 14.3 11.7 - 15.5 g/dL   HCT 42.3 35.0 - 45.0 %   MCV 95.1 80.0 - 100.0  fL   MCH 32.1 27.0 - 33.0 pg   MCHC 33.8  32.0 - 36.0 g/dL   RDW 13.4 11.0 - 15.0 %   Platelets 218 140 - 400 Thousand/uL   MPV 9.9 7.5 - 12.5 fL   Neutro Abs 3,148 1,500 - 7,800 cells/uL   Lymphs Abs 1,675 850 - 3,900 cells/uL   Absolute Monocytes 366 200 - 950 cells/uL   Eosinophils Absolute 80 15 - 500 cells/uL   Basophils Absolute 32 0 - 200 cells/uL   Neutrophils Relative % 59.4 %   Total Lymphocyte 31.6 %   Monocytes Relative 6.9 %   Eosinophils Relative 1.5 %   Basophils Relative 0.6 %  Lipid panel     Status: Abnormal   Collection Time: 05/07/21 10:10 AM  Result Value Ref Range   Cholesterol 233 (H) <200 mg/dL   HDL 77 > OR = 50 mg/dL   Triglycerides 78 <150 mg/dL   LDL Cholesterol (Calc) 139 (H) mg/dL (calc)    Comment: Reference range: <100 . Desirable range <100 mg/dL for primary prevention;   <70 mg/dL for patients with CHD or diabetic patients  with > or = 2 CHD risk factors. Marland Kitchen LDL-C is now calculated using the Martin-Hopkins  calculation, which is a validated novel method providing  better accuracy than the Friedewald equation in the  estimation of LDL-C.  Cresenciano Genre et al. Annamaria Helling. [id number]): 2061-2068  (http://education.[facility].com/faq/FAQ164)    Total CHOL/HDL Ratio 3.0 <5.0 (calc)   Non-HDL Cholesterol (Calc) 156 (H) <130 mg/dL (calc)    Comment: For patients with diabetes plus 1 major ASCVD risk  factor, treating to a non-HDL-C goal of <100 mg/dL  (LDL-C of <70 mg/dL) is considered a therapeutic  option.   Sedimentation rate     Status: None   Collection Time: 05/07/21 10:10 AM  Result Value Ref Range   Sed Rate 11 0 - 30 mm/h  C-reactive protein     Status: None   Collection Time: 05/07/21 10:10 AM  Result Value Ref Range   CRP 1.8 <8.0 mg/L    Diabetic Foot Exam: Diabetic Foot Exam - Simple   No data filed    ***  PHQ2/9: Depression screen [facility] 2/9 05/07/2021 04/02/2021 10/19/2020 05/07/2020 05/03/2020  Decreased Interest 0 0 0 0 0   Down, Depressed, Hopeless 0 0 0 0 0  PHQ - 2 Score 0 0 0 0 0  Altered sleeping - - - 0 -  Tired, decreased energy - - - 0 -  Change in appetite - - - 0 -  Feeling bad or failure about yourself  - - - 0 -  Trouble concentrating - - - 0 -  Moving slowly or fidgety/restless - - - 0 -  Suicidal thoughts - - - 0 -  PHQ-9 Score - - - 0 -  Difficult doing work/chores - - - - -    phq 9 is {gen pos [id number] ***  Fall Risk: Fall Risk  05/07/2021 04/02/2021 10/19/2020 05/07/2020 05/03/2020  Falls in the past year? 0 0 0 0 0  Number falls in past yr: 0 0 0 0 0  Injury with Fall? 0 0 0 0 0  Risk for fall due to : No Fall Risks - - - Impaired vision  Risk for fall due to: Comment - - - - -  Follow up Falls prevention discussed Falls evaluation completed - - Falls prevention discussed   ***   Functional Status Survey:   ***   Assessment & Plan  *** There are no diagnoses linked to  this encounter.

## 2021-06-25 ENCOUNTER — Ambulatory Visit: Payer: Self-pay | Admitting: Family Medicine

## 2021-07-04 ENCOUNTER — Ambulatory Visit: Payer: PPO | Admitting: Unknown Physician Specialty

## 2021-07-11 ENCOUNTER — Telehealth: Payer: Self-pay

## 2021-07-11 NOTE — Telephone Encounter (Signed)
Pt still have not heard from [facility] heart doctors. Can you please check into this for the patient and give her a call at [phone]

## 2021-07-12 NOTE — Telephone Encounter (Signed)
Lvm informing pt that someone had reached out to her and for her to give them a call back

## 2021-07-22 ENCOUNTER — Emergency Department
Admission: EM | Admit: 2021-07-22 | Discharge: 2021-07-22 | Disposition: A | Discharge status: Home / Self Care | Payer: PPO | Attending: Emergency Medicine | Admitting: Emergency Medicine

## 2021-07-22 ENCOUNTER — Emergency Department: Payer: PPO

## 2021-07-22 ENCOUNTER — Other Ambulatory Visit: Payer: Self-pay

## 2021-07-22 DIAGNOSIS — R0789 Other chest pain: Secondary | ICD-10-CM | POA: Insufficient documentation

## 2021-07-22 DIAGNOSIS — R0600 Dyspnea, unspecified: Secondary | ICD-10-CM | POA: Diagnosis not present

## 2021-07-22 DIAGNOSIS — Z7982 Long term (current) use of aspirin: Secondary | ICD-10-CM | POA: Insufficient documentation

## 2021-07-22 DIAGNOSIS — Z8616 Personal history of COVID-19: Secondary | ICD-10-CM | POA: Diagnosis not present

## 2021-07-22 DIAGNOSIS — R0602 Shortness of breath: Secondary | ICD-10-CM | POA: Insufficient documentation

## 2021-07-22 DIAGNOSIS — Z87891 Personal history of nicotine dependence: Secondary | ICD-10-CM | POA: Insufficient documentation

## 2021-07-22 LAB — BASIC METABOLIC PANEL
Anion gap: 7 (ref 5–15)
BUN: 10 mg/dL (ref 8–23)
CO2: 26 mmol/L (ref 22–32)
Calcium: 9.3 mg/dL (ref 8.9–10.3)
Chloride: 106 mmol/L (ref 98–111)
Creatinine, Ser: 0.85 mg/dL (ref 0.44–1.00)
GFR, Estimated: >60 mL/min (ref >60)
Glucose, Bld: 110 mg/dL — ABNORMAL HIGH (ref 70–99)
Potassium: 3.7 mmol/L (ref 3.5–5.1)
Sodium: 139 mmol/L (ref 135–145)

## 2021-07-22 LAB — CBC
HCT: 46.4 % — ABNORMAL HIGH (ref 36.0–46.0)
Hemoglobin: 15.9 g/dL — ABNORMAL HIGH (ref 12.0–15.0)
MCH: 32.5 pg (ref 26.0–34.0)
MCHC: 34.3 g/dL (ref 30.0–36.0)
MCV: 94.9 fL (ref 80.0–100.0)
Platelets: 245 10*3/uL (ref 150–400)
RBC: 4.89 MIL/uL (ref 3.87–5.11)
RDW: 13.4 % (ref 11.5–15.5)
WBC: 6.8 10*3/uL (ref 4.0–10.5)
nRBC: 0 % (ref 0.0–0.2)

## 2021-07-22 LAB — TROPONIN I (HIGH SENSITIVITY): Troponin I (High Sensitivity): 4 ng/L (ref <18)

## 2021-07-22 LAB — LIPASE, BLOOD: Lipase: 40 U/L (ref 11–51)

## 2021-07-22 NOTE — Discharge Instructions (Addendum)
Your EKG, blood work and chest x-ray were all reassuring today.  Please follow-up with your primary care physician and your cardiologist as scheduled.  If you develop new or worsening chest pain please return to the emergency department.

## 2021-07-22 NOTE — ED Triage Notes (Signed)
Pt to ED for diarrhea and dizziness that started yesterday. Reports shob and chest tightness for the past few months intermittent since having covid. Has been referred for echo.  NAD noted. Skin color WDL

## 2021-07-22 NOTE — ED Provider Notes (Signed)
[facility]  ____________________________________________   Event Date/Time   First MD Initiated Contact with Patient 07/22/21 1025     (approximate)  I have reviewed the triage vital signs and the nursing notes.   HISTORY  Chief Complaint Dizziness and Shortness of Breath    HPI Denise Perez is a 70 y.o. female past medical history of GERD, osteoporosis who presents with dyspnea.  Patient had COVID in May and was treated with Paxlovid. Since that time she has had intermittent dyspnea and fatigue.  She saw her primary care physician about this who referred her to a cardiologist and she is going to see cardiology in October for stress test and echo.  Patient notes that she has occasional tightness across her chest but that this is random and not associated with exertion.  She is very active goes to the gym twice a week and water aerobics twice a week and is very active with her social life.  She denies any associated dyspnea or chest pain while at the gym.  Chest pain is described as tightness and is not associated with nausea, diaphoresis or dyspnea.  Her dyspnea is random sometimes she can walk up the stairs and feel very short of breath other times she runs up the stairs and has no issues.  She continues to take good p.o., denies fevers chills or cough.  She denies any lower extremity swelling or history of DVT/PE.  This weekend she felt more fatigued and had decreased appetite.  She was going to go to the gym this morning but because of her fatigue decided she should come to the emergency department instead.         Past Medical History:  Diagnosis Date   Cervical disc disease    Cervical neuropathy    GERD (gastroesophageal reflux disease)    Osteoporosis    Tietze's disease     Patient Active Problem List   Diagnosis Date Noted   Chest tightness 06/24/2021   Atherosclerosis of aorta ([place]) 05/07/2020   FH: colon cancer 05/30/2019   Dyslipidemia  05/28/2016   Osteoporosis 05/28/2016   Cervical disc disease 05/28/2016   Primary osteoarthritis involving multiple joints 06/11/2015   Synovial cyst of wrist 06/11/2015    Past Surgical History:  Procedure Laterality Date   COLONOSCOPY WITH PROPOFOL N/A 08/13/2015   Procedure: COLONOSCOPY WITH PROPOFOL;  Surgeon: Hulen Luster, MD;  Location: [facility] ENDOSCOPY;  Service: Gastroenterology;  Laterality: N/A;   COLONOSCOPY WITH PROPOFOL N/A 06/29/2019   Procedure: COLONOSCOPY WITH PROPOFOL;  Surgeon: Toledo, Benay Pike, MD;  Location: [facility] ENDOSCOPY;  Service: Gastroenterology;  Laterality: N/A;   ESOPHAGOGASTRODUODENOSCOPY (EGD) WITH PROPOFOL     ESOPHAGOGASTRODUODENOSCOPY (EGD) WITH PROPOFOL N/A 06/29/2019   Procedure: ESOPHAGOGASTRODUODENOSCOPY (EGD) WITH PROPOFOL;  Surgeon: Toledo, Benay Pike, MD;  Location: [facility] ENDOSCOPY;  Service: Gastroenterology;  Laterality: N/A;   GUM SURGERY      Prior to Admission medications   Medication Sig Start Date End Date Taking? Authorizing Provider  aspirin 81 MG chewable tablet Chew by mouth daily.    [provider]  Cholecalciferol (VITAMIN D) 2000 units CAPS Take 1 capsule (2,000 Units total) by mouth daily. 07/07/18   Steele Sizer, MD  MINOXIDIL, TOPICAL, 5 % SOLN Apply 1 each topically daily.    [provider]  Multiple Vitamin (MULTIVITAMIN) tablet Take 1 tablet by mouth daily.    [provider]  vitamin B-12 (CYANOCOBALAMIN) 1000 MCG tablet Take 1,000 mcg  by mouth daily.    [provider]    Allergies Prednisone  Family History  Problem Relation Age of Onset   Cancer Father        Colon   Breast cancer Neg Hx     Social History Social History   Tobacco Use   Smoking status: Former    Packs/day: 1.50    Years: 15.00    Pack years: 22.50    Types: Cigarettes    Start date: 11/17/1970    Quit date: 11/17/1985    Years since quitting: 35.7   Smokeless tobacco: Never   Tobacco comments:    smoking  cessation materials not required  Vaping Use   Vaping Use: Never used  Substance Use Topics   Alcohol use: Not Currently    Alcohol/week: 0.0 standard drinks    Comment: 5 glasses of wine a year   Drug use: No    Review of Systems   Review of Systems  Constitutional:  Negative for chills and fever.  Respiratory:  Positive for chest tightness and shortness of breath. Negative for cough.   Cardiovascular:  Positive for chest pain. Negative for palpitations and leg swelling.  Gastrointestinal:  Negative for abdominal pain, diarrhea, nausea and vomiting.  Genitourinary:  Negative for dysuria.  All other systems reviewed and are negative.  Physical Exam Updated Vital Signs BP (!) 120/48   Pulse 63   Temp 98.5 F (36.9 C) (Oral)   Resp 16   Ht '5\' 6"'$  (1.676 m)   Wt 63.5 kg   SpO2 99%   BMI 22.60 kg/m   Physical Exam Vitals and nursing note reviewed.  Constitutional:      General: She is not in acute distress.    Appearance: Normal appearance.  HENT:     Head: Normocephalic and atraumatic.  Eyes:     General: No scleral icterus.    Conjunctiva/sclera: Conjunctivae normal.  Pulmonary:     Effort: Pulmonary effort is normal. No respiratory distress.     Breath sounds: Normal breath sounds. No stridor.  Musculoskeletal:        General: No deformity or signs of injury.     Cervical back: Normal range of motion.     Right lower leg: No edema.     Left lower leg: No edema.  Skin:    General: Skin is dry.     Coloration: Skin is not jaundiced or pale.  Neurological:     General: No focal deficit present.     Mental Status: She is alert and oriented to person, place, and time. Mental status is at baseline.  Psychiatric:        Mood and Affect: Mood normal.        Behavior: Behavior normal.     LABS (all labs ordered are listed, but only abnormal results are displayed)  Labs Reviewed  BASIC METABOLIC PANEL - Abnormal; Notable for the following components:      Result  Value   Glucose, Bld 110 (*)    All other components within normal limits  CBC - Abnormal; Notable for the following components:   Hemoglobin 15.9 (*)    HCT 46.4 (*)    All other components within normal limits  LIPASE, BLOOD  TROPONIN I (HIGH SENSITIVITY)   ____________________________________________  EKG  Normal sinus rhythm, normal intervals, normal axis, no acute ischemic changes ____________________________________________  RADIOLOGY Almeta Monas, personally viewed and evaluated these images (plain radiographs) as part of  my medical decision making, as well as reviewing the written report by the radiologist.  ED MD interpretation: Reviewed the chest x-ray which does not show any acute cardiopulmonary process    ____________________________________________   PROCEDURES  Procedure(s) performed (including Critical Care):  Procedures   ____________________________________________   INITIAL IMPRESSION / ASSESSMENT AND PLAN / ED COURSE     70 year old female who presents with several months of dyspnea after having COVID.  She is scheduled to see cardiology next month for a stress test.  Patient describes intermittent dyspnea as well as chest tightness that are not associated with each other and not associated with exertion consistently.  She is extremely healthy for her age and active at the gym almost 4 times per week and does not have the symptoms of dyspnea or chest pain while exercising.  Additionally her symptoms have not significantly changed over the past month.  Vital signs today are within normal limits.  She is very well-appearing with clear lungs no signs of DVT on exam.  Her EKG is normal as is her chest x-ray and her labs.  I have low suspicion for unstable angina based on her normal EKG and troponin today as well as the fact that her symptoms are not exertional and she is able to still do her exercise.  I suspect that this is the result of lingering  COVID.  Do feel like she should be seen by cardiology but I feel that it can wait a month and does not need to be urgent at this time.  Discussed return precautions with the patient.      ____________________________________________   FINAL CLINICAL IMPRESSION(S) / ED DIAGNOSES  Final diagnoses:  Dyspnea, unspecified type     ED Discharge Orders     None        Note:  This document was prepared using Dragon voice recognition software and may include unintentional dictation errors.    Rada Hay, MD 07/22/21 1050

## 2021-08-15 ENCOUNTER — Encounter: Payer: Self-pay | Admitting: Cardiology

## 2021-08-15 ENCOUNTER — Other Ambulatory Visit: Payer: Self-pay

## 2021-08-15 ENCOUNTER — Ambulatory Visit: Payer: PPO | Admitting: Cardiology

## 2021-08-15 VITALS — BP 100/60 | HR 64 | Ht 66.0 in | Wt 138.0 lb

## 2021-08-15 DIAGNOSIS — R0602 Shortness of breath: Secondary | ICD-10-CM

## 2021-08-15 DIAGNOSIS — R072 Precordial pain: Secondary | ICD-10-CM | POA: Diagnosis not present

## 2021-08-15 MED ORDER — IVABRADINE HCL 5 MG PO TABS: 15.0000 mg | ORAL_TABLET | Freq: Once | ORAL | 0 refills | Status: AC

## 2021-08-15 NOTE — Patient Instructions (Addendum)
Medication Instructions:  Your physician recommends that you continue on your current medications as directed. Please refer to the Current Medication list given to you today.  *If you need a refill on your cardiac medications before your next appointment, please call your pharmacy*   Lab Work:  None Ordered   Testing/Procedures:   Your physician has requested that you have an echocardiogram. Echocardiography is a painless test that uses sound waves to create images of your heart. It provides your doctor with information about the size and shape of your heart and how well your heart's chambers and valves are working. This procedure takes approximately one hour. There are no restrictions for this procedure.  Your physician has requested that you have cardiac CT. Cardiac computed tomography (CT) is a painless test that uses an x-ray machine to take clear, detailed pictures of your heart.   Your cardiac CT will be scheduled at:      Thursday 08/22/21 at 08:45 (Unless we call you to change this due to insurance authorization)  [facility] [address] [place], [place] [address] [phone]  Please arrive 15 mins early for check-in and test prep.   Please follow these instructions carefully (unless otherwise directed):    On the Night Before the Test: Be sure to Drink plenty of water. Do not consume any caffeinated/decaffeinated beverages or chocolate 12 hours prior to your test.   On the Day of the Test: Drink plenty of water until 1 hour prior to the test. Do not eat any food 4 hours prior to the test. You may take your regular medications prior to the test.  Take Ivabradine (Corlanor) 15 MG this is 3 tablets (5MG ) from the sample bottle given to your at your office visit FEMALES- please wear underwire-free bra if available, avoid dresses & tight clothing       After the Test: Drink plenty of water. After receiving IV  contrast, you may experience a mild flushed feeling. This is normal. On occasion, you may experience a mild rash up to 24 hours after the test. This is not dangerous. If this occurs, you can take Benadryl 25 mg and increase your fluid intake. If you experience trouble breathing, this can be serious. If it is severe call 911 IMMEDIATELY. If it is mild, please call our office. If you take any of these medications: Glipizide/Metformin, Avandament, Glucavance, please do not take 48 hours after completing test unless otherwise instructed.  Please allow 2-4 weeks for scheduling of routine cardiac CTs. Some insurance companies require a pre-authorization which may delay scheduling of this test.   For non-scheduling related questions, please contact the cardiac imaging nurse navigator should you have any questions/concerns: Marchia Bond, Cardiac Imaging Nurse Navigator Gordy Clement, Cardiac Imaging Nurse Navigator [facility] Direct Office Dial: [phone]   For scheduling needs, including cancellations and rescheduling, please call [place], [phone].     Follow-Up: At [facility], you and your health needs are our priority.  As part of our continuing mission to provide you with exceptional heart care, we have created designated Provider Care Teams.  These Care Teams include your primary Cardiologist (physician) and Advanced Practice Providers (APPs -  Physician Assistants and Nurse Practitioners) who all work together to provide you with the care you need, when you need it.  We recommend signing up for the patient portal called "MyChart".  Sign up information is provided on this After Visit Summary.  MyChart  is used to connect with patients for Virtual Visits (Telemedicine).  Patients are able to view lab/test results, encounter notes, upcoming appointments, etc.  Non-urgent messages can be sent to your provider as well.   To learn more about what you can do with  MyChart, go to [email or web address].    Your next appointment:    First available after 08/22/21 and Echo   The format for your next appointment:   In Person  Provider:   Kate Sable, MD   Other Instructions

## 2021-08-15 NOTE — Progress Notes (Signed)
Cardiology Office Note:    Date:  6/37/8588   ID:  Denise Perez, DOB 1951/07/15, MRN [id number]  PCP:  Steele Sizer, MD   [place] Providers Cardiologist:  Kate Sable, MD     Referring MD: Myles Gip, DO   Chief Complaint  Patient presents with   New patient    Referred by PCP for Chest tightness that comes and SOB. Patient states she has long COVID and she needs a stress test to prove its not heart related. Meds reviewed verbally with patient.    Denise Perez is a 70 y.o. female who is being seen today for the evaluation of chest pain at the request of Myles Gip, DO.   History of Present Illness:    Denise Perez is a 70 y.o. female with no significant past medical history presenting with chest pain and shortness of breath.  She was diagnosed with COVID-19 infection 5 months ago.  Since then, she states feeling short of breath more than usual after performing her regular activities.  She occasionally has a central chest tightness which can last anywhere from minutes to hours.  Chest tightness is not associated with exertion.  Symptoms have stayed about the same over the past 5 months.  Denies any personal or family history of heart attacks.  She was told by her PCP that her shortness of breath is secondary to lingering effects of COVID.  She wants to make sure her heart function is okay due to persistent symptoms.  Past Medical History:  Diagnosis Date   Cervical disc disease    Cervical neuropathy    GERD (gastroesophageal reflux disease)    Osteoporosis    Tietze's disease     Past Surgical History:  Procedure Laterality Date   COLONOSCOPY WITH PROPOFOL N/A 08/13/2015   Procedure: COLONOSCOPY WITH PROPOFOL;  Surgeon: Hulen Luster, MD;  Location: [facility] ENDOSCOPY;  Service: Gastroenterology;  Laterality: N/A;   COLONOSCOPY WITH PROPOFOL N/A 06/29/2019   Procedure: COLONOSCOPY WITH PROPOFOL;  Surgeon: Toledo, Benay Pike, MD;  Location: [facility]  ENDOSCOPY;  Service: Gastroenterology;  Laterality: N/A;   ESOPHAGOGASTRODUODENOSCOPY (EGD) WITH PROPOFOL     ESOPHAGOGASTRODUODENOSCOPY (EGD) WITH PROPOFOL N/A 06/29/2019   Procedure: ESOPHAGOGASTRODUODENOSCOPY (EGD) WITH PROPOFOL;  Surgeon: Toledo, Benay Pike, MD;  Location: [facility] ENDOSCOPY;  Service: Gastroenterology;  Laterality: N/A;   GUM SURGERY     TOOTH EXTRACTION      Current Medications: Current Meds  Medication Sig   amoxicillin (AMOXIL) 500 MG capsule Take 500 mg by mouth 3 (three) times daily.   aspirin 81 MG chewable tablet Chew by mouth daily.   chlorhexidine (PERIDEX) 0.12 % solution 15 mLs 2 (two) times daily.   Cholecalciferol (VITAMIN D) 2000 units CAPS Take 1 capsule (2,000 Units total) by mouth daily.   ivabradine (CORLANOR) 5 MG TABS tablet Take 3 tablets (15 mg total) by mouth once for 1 dose. Take 2 hours prior to your procedure.   IVERMECTIN PO Take by mouth once.   MINOXIDIL, TOPICAL, 5 % SOLN Apply 1 each topically daily.   Omega-3 Fatty Acids (FISH OIL) 1000 MG CAPS Take 1 capsule by mouth daily.   Turmeric 400 MG CAPS Take 1 capsule by mouth daily.     Allergies:   Prednisone   Social History   Socioeconomic History   Marital status: Married    Spouse name: Glendell Docker   Number of children: 0   Years of education: Not  on file   Highest education level: Bachelor's degree (e.g., BA, AB, BS)  Occupational History   Occupation: Retired  Tobacco Use   Smoking status: Former    Packs/day: 1.50    Years: 15.00    Pack years: 22.50    Types: Cigarettes    Start date: 11/17/1970    Quit date: 11/17/1985    Years since quitting: 35.7   Smokeless tobacco: Never   Tobacco comments:    smoking cessation materials not required  Vaping Use   Vaping Use: Never used  Substance and Sexual Activity   Alcohol use: Not Currently    Alcohol/week: 0.0 standard drinks    Comment: 5 glasses of wine a year   Drug use: No   Sexual activity: Not Currently    Partners: Male   Other Topics Concern   Not on file  Social History Narrative   Not on file   Social Determinants of Health   Financial Resource Strain: Low Risk    Difficulty of Paying Living Expenses: Not hard at all  Food Insecurity: No Food Insecurity   Worried About Charity fundraiser in the Last Year: Never true   Ran Out of Food in the Last Year: Never true  Transportation Needs: No Transportation Needs   Lack of Transportation (Medical): No   Lack of Transportation (Non-Medical): No  Physical Activity: Sufficiently Active   Days of Exercise per Week: 5 days   Minutes of Exercise per Session: 50 min  Stress: No Stress Concern Present   Feeling of Stress : Not at all  Social Connections: Socially Integrated   Frequency of Communication with Friends and Family: More than three times a week   Frequency of Social Gatherings with Friends and Family: More than three times a week   Attends Religious Services: More than 4 times per year   Active Member of [employer] or Organizations: Yes   Attends Music therapist: More than 4 times per year   Marital Status: Married     Family History: The patient's family history includes Cancer in her father. There is no history of Breast cancer.  ROS:   Please see the history of present illness.     All other systems reviewed and are negative.  EKGs/Labs/Other Studies Reviewed:    The following studies were reviewed today:   EKG:  EKG not ordered today.   EKG obtained 07/22/2021 reviewed by myself showing normal sinus rhythm, normal ECG.  Recent Labs: 05/07/2021: ALT 12; TSH 0.73 07/22/2021: BUN 10; Creatinine, Ser 0.85; Hemoglobin 15.9; Platelets 245; Potassium 3.7; Sodium 139  Recent Lipid Panel    Component Value Date/Time   CHOL 233 (H) 05/07/2021 1010   TRIG 78 05/07/2021 1010   HDL 77 05/07/2021 1010   CHOLHDL 3.0 05/07/2021 1010   VLDL 13 05/28/2016 1108   LDLCALC 139 (H) 05/07/2021 1010     Risk Assessment/Calculations:           Physical Exam:    VS:  BP 100/60 (BP Location: Left Arm, Patient Position: Sitting, Cuff Size: Normal)   Pulse 64   Ht 5\' 6"  (1.676 m)   Wt 138 lb (62.6 kg)   SpO2 97%   BMI 22.27 kg/m     Wt Readings from Last 3 Encounters:  08/15/21 138 lb (62.6 kg)  07/22/21 140 lb (63.5 kg)  06/24/21 139 lb 9.6 oz (63.3 kg)     GEN:  Well nourished, well developed in no acute  distress HEENT: Normal NECK: No JVD; No carotid bruits LYMPHATICS: No lymphadenopathy CARDIAC: RRR, no murmurs, rubs, gallops RESPIRATORY:  Clear to auscultation without rales, wheezing or rhonchi  ABDOMEN: Soft, non-tender, non-distended MUSCULOSKELETAL:  No edema; No deformity  SKIN: Warm and dry NEUROLOGIC:  Alert and oriented x 3 PSYCHIATRIC:  Normal affect   ASSESSMENT:    1. Precordial pain   2. Shortness of breath    PLAN:    In order of problems listed above:  Chest pain, shortness of breath, ongoing over 5 months.  Main risk factors age.  Obtain coronary CTA to evaluate presence of CAD, obtain echo to evaluate systolic and diastolic function. Shortness of breath, echo as above.  Possible lingering effects of COVID-19 infection.  Follow-up after echo and coronary CTA.    Medication Adjustments/Labs and Tests Ordered: Current medicines are reviewed at length with the patient today.  Concerns regarding medicines are outlined above.  Orders Placed This Encounter  Procedures   CT CORONARY MORPH W/CTA COR W/SCORE W/CA W/CM &/OR WO/CM   ECHOCARDIOGRAM COMPLETE    Meds ordered this encounter  Medications   ivabradine (CORLANOR) 5 MG TABS tablet    Sig: Take 3 tablets (15 mg total) by mouth once for 1 dose. Take 2 hours prior to your procedure.    Dispense:  3 tablet    Refill:  0     Patient Instructions  Medication Instructions:  Your physician recommends that you continue on your current medications as directed. Please refer to the Current Medication list given to you  today.  *If you need a refill on your cardiac medications before your next appointment, please call your pharmacy*   Lab Work:  None Ordered   Testing/Procedures:   Your physician has requested that you have an echocardiogram. Echocardiography is a painless test that uses sound waves to create images of your heart. It provides your doctor with information about the size and shape of your heart and how well your heart's chambers and valves are working. This procedure takes approximately one hour. There are no restrictions for this procedure.  Your physician has requested that you have cardiac CT. Cardiac computed tomography (CT) is a painless test that uses an x-ray machine to take clear, detailed pictures of your heart.   Your cardiac CT will be scheduled at:      Thursday 08/22/21 at 08:45 (Unless we call you to change this due to insurance authorization)  [facility] [address] [place], [place] [address] [phone]  Please arrive 15 mins early for check-in and test prep.   Please follow these instructions carefully (unless otherwise directed):    On the Night Before the Test: Be sure to Drink plenty of water. Do not consume any caffeinated/decaffeinated beverages or chocolate 12 hours prior to your test.   On the Day of the Test: Drink plenty of water until 1 hour prior to the test. Do not eat any food 4 hours prior to the test. You may take your regular medications prior to the test.  Take Ivabradine (Corlanor) 15 MG this is 3 tablets (5MG ) from the sample bottle given to your at your office visit FEMALES- please wear underwire-free bra if available, avoid dresses & tight clothing       After the Test: Drink plenty of water. After receiving IV contrast, you may experience a mild flushed feeling. This is normal. On occasion, you may experience a mild rash up to 24  hours after the test. This is not dangerous. If this  occurs, you can take Benadryl 25 mg and increase your fluid intake. If you experience trouble breathing, this can be serious. If it is severe call 911 IMMEDIATELY. If it is mild, please call our office. If you take any of these medications: Glipizide/Metformin, Avandament, Glucavance, please do not take 48 hours after completing test unless otherwise instructed.  Please allow 2-4 weeks for scheduling of routine cardiac CTs. Some insurance companies require a pre-authorization which may delay scheduling of this test.   For non-scheduling related questions, please contact the cardiac imaging nurse navigator should you have any questions/concerns: Marchia Bond, Cardiac Imaging Nurse Navigator Gordy Clement, Cardiac Imaging Nurse Navigator [facility] Direct Office Dial: [phone]   For scheduling needs, including cancellations and rescheduling, please call [place], [phone].     Follow-Up: At [facility], you and your health needs are our priority.  As part of our continuing mission to provide you with exceptional heart care, we have created designated Provider Care Teams.  These Care Teams include your primary Cardiologist (physician) and Advanced Practice Providers (APPs -  Physician Assistants and Nurse Practitioners) who all work together to provide you with the care you need, when you need it.  We recommend signing up for the patient portal called "MyChart".  Sign up information is provided on this After Visit Summary.  MyChart is used to connect with patients for Virtual Visits (Telemedicine).  Patients are able to view lab/test results, encounter notes, upcoming appointments, etc.  Non-urgent messages can be sent to your provider as well.   To learn more about what you can do with MyChart, go to [email or web address].    Your next appointment:    First available after 08/22/21 and Echo   The format for your next appointment:   In  Person  Provider:   Kate Sable, MD   Other Instructions    Signed, Kate Sable, MD  08/15/2021 1:03 PM    [place]

## 2021-08-16 ENCOUNTER — Ambulatory Visit (INDEPENDENT_AMBULATORY_CARE_PROVIDER_SITE_OTHER): Payer: PPO

## 2021-08-16 DIAGNOSIS — R072 Precordial pain: Secondary | ICD-10-CM | POA: Diagnosis not present

## 2021-08-16 DIAGNOSIS — R0602 Shortness of breath: Secondary | ICD-10-CM

## 2021-08-16 LAB — ECHOCARDIOGRAM COMPLETE
AR max vel: 2.55 cm2
AV Area VTI: 2.41 cm2
AV Area mean vel: 2.35 cm2
AV Mean grad: 3 mmHg
AV Peak grad: 6 mmHg
Ao pk vel: 1.22 m/s
Area-P 1/2: 4.41 cm2
Calc EF: 43 %
S' Lateral: 3.4 cm
Single Plane A2C EF: 45.3 %
Single Plane A4C EF: 44.6 %

## 2021-08-19 ENCOUNTER — Ambulatory Visit: Payer: PPO | Admitting: Cardiology

## 2021-08-21 ENCOUNTER — Telehealth (HOSPITAL_COMMUNITY): Payer: Self-pay | Admitting: Emergency Medicine

## 2021-08-21 NOTE — Telephone Encounter (Signed)
Attempted to call patient regarding upcoming cardiac CT appointment. °Left message on voicemail with name and callback number °Yukio Bisping RN Navigator Cardiac Imaging °[facility] °[phone] Office °[phone] Cell ° °

## 2021-08-21 NOTE — Telephone Encounter (Signed)
Reaching out to patient to offer assistance regarding upcoming cardiac imaging study; pt verbalizes understanding of appt date/time, parking situation and where to check in, pre-test NPO status and medications ordered, and verified current allergies; name and call back number provided for further questions should they arise Marchia Bond RN Navigator Cardiac Imaging [facility] [phone] office [phone] cell   15mg  corlanor

## 2021-08-22 ENCOUNTER — Other Ambulatory Visit: Payer: Self-pay

## 2021-08-22 ENCOUNTER — Ambulatory Visit
Admission: RE | Admit: 2021-08-22 | Discharge: 2021-08-22 | Disposition: A | Discharge status: Home / Self Care | Payer: PPO | Source: Ambulatory Visit | Attending: Cardiology | Admitting: Cardiology

## 2021-08-22 DIAGNOSIS — R072 Precordial pain: Secondary | ICD-10-CM | POA: Insufficient documentation

## 2021-08-22 MED ORDER — IOHEXOL 350 MG/ML SOLN
75.0000 mL | Freq: Once | INTRAVENOUS | Status: AC | PRN
  Administered 2021-08-22: 75 mL via INTRAVENOUS

## 2021-08-22 MED ORDER — NITROGLYCERIN 0.4 MG SL SUBL
0.4000 mg | SUBLINGUAL_TABLET | Freq: Once | SUBLINGUAL | Status: AC
  Administered 2021-08-22: 0.4 mg via SUBLINGUAL

## 2021-08-22 NOTE — Progress Notes (Signed)
Patient tolerated procedure well. Ambulate w/o difficulty. Denies light headedness or being dizzy. Sitting in chair drinking water provided. Encouraged to drink extra water today and reasoning explained. Verbalized understanding. All questions answered. ABC intact. No further needs. Discharge from procedure area w/o issues.   °

## 2021-08-23 ENCOUNTER — Telehealth: Payer: Self-pay | Admitting: Cardiology

## 2021-08-23 NOTE — Telephone Encounter (Signed)
Patient wants to know if upcoming appt necessary as results ok or if she can cancel.  Ok to respond via [employer]

## 2021-08-26 NOTE — Telephone Encounter (Signed)
Responded to patient via MyChart as requested. Informed her that I did go ahead and cancel her follow up since all testing came back normal. Will route to Dr. Garen Lah for any further recommended follow up time frames.

## 2021-09-04 DIAGNOSIS — L659 Nonscarring hair loss, unspecified: Secondary | ICD-10-CM | POA: Diagnosis not present

## 2021-09-04 DIAGNOSIS — D2272 Melanocytic nevi of left lower limb, including hip: Secondary | ICD-10-CM | POA: Diagnosis not present

## 2021-09-04 DIAGNOSIS — D2261 Melanocytic nevi of right upper limb, including shoulder: Secondary | ICD-10-CM | POA: Diagnosis not present

## 2021-09-04 DIAGNOSIS — D2262 Melanocytic nevi of left upper limb, including shoulder: Secondary | ICD-10-CM | POA: Diagnosis not present

## 2021-09-04 DIAGNOSIS — L821 Other seborrheic keratosis: Secondary | ICD-10-CM | POA: Diagnosis not present

## 2021-09-04 DIAGNOSIS — L309 Dermatitis, unspecified: Secondary | ICD-10-CM | POA: Diagnosis not present

## 2021-09-04 DIAGNOSIS — D225 Melanocytic nevi of trunk: Secondary | ICD-10-CM | POA: Diagnosis not present

## 2021-09-04 DIAGNOSIS — D2271 Melanocytic nevi of right lower limb, including hip: Secondary | ICD-10-CM | POA: Diagnosis not present

## 2021-09-04 DIAGNOSIS — L570 Actinic keratosis: Secondary | ICD-10-CM | POA: Diagnosis not present

## 2021-09-05 ENCOUNTER — Ambulatory Visit: Payer: PPO | Admitting: Cardiology

## 2022-01-13 ENCOUNTER — Ambulatory Visit: Payer: Self-pay | Admitting: *Deleted

## 2022-01-13 NOTE — Telephone Encounter (Signed)
°  Chief Complaint: diarrhea for 6 days now frequent soft stools with urgency.  Lost 6 lbs.  (Had sick exposure) Symptoms: Had stomach bug a week ago.   BMs not returning to normal Frequency: Started last week Pertinent Negatives: Patient denies abd pain or fever. Disposition: [] ED /[] Urgent Care (no appt availability in office) / [x] Appointment(In office/virtual)/ []  [facility]/ [] Home Care/ [] Refused Recommended Disposition /[]  Mobile Bus/ []  Follow-up with PCP Additional Notes: Encouraged to drink more fluids, water and Gator Aid.

## 2022-01-13 NOTE — Telephone Encounter (Signed)
Reason for Disposition  [1] MODERATE diarrhea (e.g., 4-6 times / day more than normal) AND [2] age > 70 years  Answer Assessment - Initial Assessment Questions 1. DIARRHEA SEVERITY: "How bad is the diarrhea?" "How many more stools have you had in the past 24 hours than normal?"    - NO DIARRHEA (SCALE 0)   - MILD (SCALE 1-3): Few loose or mushy BMs; increase of 1-3 stools over normal daily number of stools; mild increase in ostomy output.   -  MODERATE (SCALE 4-7): Increase of 4-6 stools daily over normal; moderate increase in ostomy output. * SEVERE (SCALE 8-10; OR 'WORST POSSIBLE'): Increase of 7 or more stools daily over normal; moderate increase in ostomy output; incontinence.     I've had diarrhea 6 days now.    I'm eating cereal now.   I'm drinking Gator Aid and a bottle of water a day. I've had long covid since June of last year.   I have SOB, tired, tightness in my chest.   I've seen many specialists and had a lot of tests.   2. ONSET: "When did the diarrhea begin?"      Monday I was exposed to a friend.   Tuesday 3:00 AM I woke up with diarrhea.   I had 2 episodes of vomiting Tuesday.   Everyone else is fine.   I Wed. I didn't go to bathroom.   Thur.   I went 3 times.  Yesterday I felt fine.  But yesterday the diarrhea started again.   I'm having soft stool now.   3. BM CONSISTENCY: "How loose or watery is the diarrhea?"      Soft stools now.   No fever or abd pain.   I just never know when the urge is going to hit.  I have to stay near the bathroom.   I'm eating normal food but my appetite is poor.   I've lost a couple of lbs.    I've lost 6 more lbs.    I'm feeling weak. 4. VOMITING: "Are you also vomiting?" If Yes, ask: "How many times in the past 24 hours?"      No not Tuesday.    5. ABDOMINAL PAIN: "Are you having any abdominal pain?" If Yes, ask: "What does it feel like?" (e.g., crampy, dull, intermittent, constant)      No 6. ABDOMINAL PAIN SEVERITY: If present, ask: "How bad is  the pain?"  (e.g., Scale 1-10; mild, moderate, or severe)   - MILD (1-3): doesn't interfere with normal activities, abdomen soft and not tender to touch    - MODERATE (4-7): interferes with normal activities or awakens from sleep, abdomen tender to touch    - SEVERE (8-10): excruciating pain, doubled over, unable to do any normal activities       *No Answer* 7. ORAL INTAKE: If vomiting, "Have you been able to drink liquids?" "How much liquids have you had in the past 24 hours?"     *No Answer* 8. HYDRATION: "Any signs of dehydration?" (e.g., dry mouth [not just dry lips], too weak to stand, dizziness, new weight loss) "When did you last urinate?"     *No Answer* 9. EXPOSURE: "Have you traveled to a foreign country recently?" "Have you been exposed to anyone with diarrhea?" "Could you have eaten any food that was spoiled?"     *No Answer* 10. ANTIBIOTIC USE: "Are you taking antibiotics now or have you taken antibiotics in the past 2 months?"       *  No Answer* 11. OTHER SYMPTOMS: "Do you have any other symptoms?" (e.g., fever, blood in stool)       *No Answer* 12. PREGNANCY: "Is there any chance you are pregnant?" "When was your last menstrual period?"       *No Answer*  Protocols used: [facility]

## 2022-01-14 ENCOUNTER — Encounter: Payer: Self-pay | Admitting: Nurse Practitioner

## 2022-01-14 ENCOUNTER — Ambulatory Visit (INDEPENDENT_AMBULATORY_CARE_PROVIDER_SITE_OTHER): Payer: PPO | Admitting: Nurse Practitioner

## 2022-01-14 ENCOUNTER — Other Ambulatory Visit: Payer: Self-pay

## 2022-01-14 VITALS — BP 112/72 | HR 74 | Temp 98.2°F | Resp 16 | Ht 66.0 in | Wt 132.0 lb

## 2022-01-14 DIAGNOSIS — R197 Diarrhea, unspecified: Secondary | ICD-10-CM

## 2022-01-14 NOTE — Progress Notes (Signed)
BP 112/72    Pulse 74    Temp 98.2 F (36.8 C) (Oral)    Resp 16    Ht 5\' 6"  (1.676 m)    Wt 132 lb (59.9 kg)    SpO2 99%    BMI 21.31 kg/m    Subjective:    Patient ID: Denise Perez, female    DOB: Mar 18, 1951, 71 y.o.   MRN: [id number]  HPI: Denise Perez is a 71 y.o. female, here alone  Chief Complaint  Patient presents with   Diarrhea    For 6 days   Diarrhea: She says she has had diarrhea for about six days.  She says she has been able to eat and keep fluids down. She says she vomited twice the day it started. She denies any abdominal pain, fever, nausea or any further vomiting. She says yesterday when she has a bm it was soft and not watery.  She says she has not had a bm today.  She reports that she is feeling better. Discussed sending her home with a stool cup incase the diarrhea returns.  Also discussed she can take imodium for diarrhea.  Discussed that it was likely a gi bug but if it returns she has a stool cup to collect a sample and bring back to the office.   Relevant past medical, surgical, family and social history reviewed and updated as indicated. Interim medical history since our last visit reviewed. Allergies and medications reviewed and updated.  Review of Systems  Constitutional: Negative for fever or weight change.  Respiratory: Negative for cough and shortness of breath.   Cardiovascular: Negative for chest pain or palpitations.  Gastrointestinal: Negative for abdominal pain, diarrhea Musculoskeletal: Negative for gait problem or joint swelling.  Skin: Negative for rash.  Neurological: Negative for dizziness or headache.  No other specific complaints in a complete review of systems (except as listed in HPI above).      Objective:    BP 112/72    Pulse 74    Temp 98.2 F (36.8 C) (Oral)    Resp 16    Ht 5\' 6"  (1.676 m)    Wt 132 lb (59.9 kg)    SpO2 99%    BMI 21.31 kg/m   Wt Readings from Last 3 Encounters:  01/14/22 132 lb (59.9 kg)  08/15/21  138 lb (62.6 kg)  07/22/21 140 lb (63.5 kg)    Physical Exam  Constitutional: Patient appears well-developed and well-nourished. No distress.  HEENT: head atraumatic, normocephalic, pupils equal and reactive to light, neck supple Cardiovascular: Normal rate, regular rhythm and normal heart sounds.  No murmur heard. No BLE edema. Pulmonary/Chest: Effort normal and breath sounds normal. No respiratory distress. Abdominal: Soft.  There is no tenderness. Psychiatric: Patient has a normal mood and affect. behavior is normal. Judgment and thought content normal.   Results for orders placed or performed in visit on 08/16/21  ECHOCARDIOGRAM COMPLETE  Result Value Ref Range   AR max vel 2.55 cm2   AV Peak grad 6.0 mmHg   Ao pk vel 1.22 m/s   S' Lateral 3.40 cm   Area-P 1/2 4.41 cm2   AV Area VTI 2.41 cm2   AV Mean grad 3.0 mmHg   Single Plane A4C EF 44.6 %   Single Plane A2C EF 45.3 %   Calc EF 43.0 %   AV Area mean vel 2.35 cm2      Assessment & Plan:  1. Diarrhea, unspecified type -push fluids, BRAT diet -take imodium if diarrhea returns -GI panel only if diarrhea returns - Gastrointestinal Pathogen Panel PCR   Follow up plan: Return if symptoms worsen or fail to improve.

## 2022-02-11 ENCOUNTER — Ambulatory Visit: Payer: PPO | Admitting: Family Medicine

## 2022-03-13 DIAGNOSIS — H0288A Meibomian gland dysfunction right eye, upper and lower eyelids: Secondary | ICD-10-CM | POA: Diagnosis not present

## 2022-03-13 DIAGNOSIS — H5213 Myopia, bilateral: Secondary | ICD-10-CM | POA: Diagnosis not present

## 2022-03-13 DIAGNOSIS — H524 Presbyopia: Secondary | ICD-10-CM | POA: Diagnosis not present

## 2022-03-13 DIAGNOSIS — H2513 Age-related nuclear cataract, bilateral: Secondary | ICD-10-CM | POA: Diagnosis not present

## 2022-03-13 DIAGNOSIS — H0288B Meibomian gland dysfunction left eye, upper and lower eyelids: Secondary | ICD-10-CM | POA: Diagnosis not present

## 2022-05-07 NOTE — Progress Notes (Unsigned)
Name: Denise Perez   MRN: [id number]    DOB: 28-Sep-1951   Date:05/07/2022       Progress Note  Subjective  Chief Complaint  Follow Up  HPI  [place] COVID-19: she was diagnosed in May 22  and took Paxlovid, her symptoms were very mild, however she states since COVID-19 she has noticed decrease in exercise tolerance. Mostly when going to gym, or walking a long distance, but also after a shower. No chest pain or palpitation. She states gradually improving but discussed referral to cardiologist if she changes her mind. She states cough resolved.   Age related osteoporosis: explained that was slight progression of osteoporosis and discussed medications options, she has been physically active - going to the gym 4-5 times a week. We will recheck bone density today    Dyslipidemia: she is only on life style modification, since last visit she had CT abdomen pelvis that showed atherosclerosis of aorta.  She declines starting statin, but has been active, healthy diet and now added Avocado . She was on aspirin but stopped a couple of months ago, discussed USPTF and she will take it only a few times a week from now on    Hair loss: she noticed thinning on top of her scalp over the past several years, she was seen by Dr. Evorn Gong and will start Minoxidil now    Patient Active Problem List   Diagnosis Date Noted   Chest tightness 06/24/2021   Atherosclerosis of aorta ([place]) 05/07/2020   FH: colon cancer 05/30/2019   Dyslipidemia 05/28/2016   Osteoporosis 05/28/2016   Cervical disc disease 05/28/2016   Primary osteoarthritis involving multiple joints 06/11/2015   Synovial cyst of wrist 06/11/2015    Past Surgical History:  Procedure Laterality Date   COLONOSCOPY WITH PROPOFOL N/A 08/13/2015   Procedure: COLONOSCOPY WITH PROPOFOL;  Surgeon: Hulen Luster, MD;  Location: [facility] ENDOSCOPY;  Service: Gastroenterology;  Laterality: N/A;   COLONOSCOPY WITH PROPOFOL N/A 06/29/2019   Procedure: COLONOSCOPY  WITH PROPOFOL;  Surgeon: Toledo, Benay Pike, MD;  Location: [facility] ENDOSCOPY;  Service: Gastroenterology;  Laterality: N/A;   ESOPHAGOGASTRODUODENOSCOPY (EGD) WITH PROPOFOL     ESOPHAGOGASTRODUODENOSCOPY (EGD) WITH PROPOFOL N/A 06/29/2019   Procedure: ESOPHAGOGASTRODUODENOSCOPY (EGD) WITH PROPOFOL;  Surgeon: Toledo, Benay Pike, MD;  Location: [facility] ENDOSCOPY;  Service: Gastroenterology;  Laterality: N/A;   GUM SURGERY     TOOTH EXTRACTION      Family History  Problem Relation Age of Onset   Cancer Father        Colon   Breast cancer Neg Hx     Social History   Tobacco Use   Smoking status: Former    Packs/day: 1.50    Years: 15.00    Total pack years: 22.50    Types: Cigarettes    Start date: 11/17/1970    Quit date: 11/17/1985    Years since quitting: 36.4   Smokeless tobacco: Never   Tobacco comments:    smoking cessation materials not required  Substance Use Topics   Alcohol use: Not Currently    Alcohol/week: 0.0 standard drinks of alcohol    Comment: 5 glasses of wine a year     Current Outpatient Medications:    amoxicillin (AMOXIL) 500 MG capsule, Take 500 mg by mouth 3 (three) times daily. (Patient not taking: Reported on 08/22/2021), Disp: , Rfl:    aspirin 81 MG chewable tablet, Chew by mouth daily. (Patient not taking: Reported on 08/22/2021), Disp: , Rfl:  chlorhexidine (PERIDEX) 0.12 % solution, 15 mLs 2 (two) times daily., Disp: , Rfl:    Cholecalciferol (VITAMIN D) 2000 units CAPS, Take 1 capsule (2,000 Units total) by mouth daily., Disp: 30 capsule, Rfl: 0   IVERMECTIN PO, Take by mouth once., Disp: , Rfl:    MINOXIDIL, TOPICAL, 5 % SOLN, Apply 1 each topically daily. (Patient not taking: Reported on 08/22/2021), Disp: , Rfl:    Omega-3 Fatty Acids (FISH OIL) 1000 MG CAPS, Take 1 capsule by mouth daily., Disp: , Rfl:    Turmeric 400 MG CAPS, Take 1 capsule by mouth daily., Disp: , Rfl:   Allergies  Allergen Reactions   Prednisone Other (See Comments)    Shaking     I personally reviewed active problem list, medication list, allergies, family history, social history, health maintenance with the patient/caregiver today.   ROS  ***  Objective  There were no vitals filed for this visit.  There is no height or weight on file to calculate BMI.  Physical Exam ***  No results found for this or any previous visit (from the past 2160 hour(s)).   PHQ2/9:    01/14/2022    2:37 PM 06/24/2021    3:01 PM 05/07/2021    8:50 AM 04/02/2021    9:03 AM 10/19/2020    9:45 AM  Depression screen PHQ 2/9  Decreased Interest 0 0 0 0 0  Down, Depressed, Hopeless 0 0 0 0 0  PHQ - 2 Score 0 0 0 0 0    phq 9 is {gen pos [id number]}   Fall Risk:    01/14/2022    2:36 PM 06/24/2021    3:01 PM 05/07/2021    8:51 AM 04/02/2021    9:03 AM 10/19/2020    9:45 AM  Fall Risk   Falls in the past year? 0 0 0 0 0  Number falls in past yr: 0 0 0 0 0  Injury with Fall? 0 0 0 0 0  Risk for fall due to :   No Fall Risks    Follow up Falls evaluation completed Falls evaluation completed Falls prevention discussed Falls evaluation completed       Functional Status Survey:      Assessment & Plan  *** There are no diagnoses linked to this encounter.

## 2022-05-08 ENCOUNTER — Ambulatory Visit (INDEPENDENT_AMBULATORY_CARE_PROVIDER_SITE_OTHER): Payer: PPO | Admitting: Family Medicine

## 2022-05-08 ENCOUNTER — Ambulatory Visit (INDEPENDENT_AMBULATORY_CARE_PROVIDER_SITE_OTHER): Payer: PPO | Admitting: Physician Assistant

## 2022-05-08 ENCOUNTER — Encounter: Payer: Self-pay | Admitting: Physician Assistant

## 2022-05-08 ENCOUNTER — Other Ambulatory Visit: Payer: Self-pay

## 2022-05-08 ENCOUNTER — Other Ambulatory Visit: Payer: Self-pay | Admitting: Family Medicine

## 2022-05-08 VITALS — BP 110/72 | HR 70 | Temp 97.8°F | Resp 16 | Ht 66.0 in | Wt 132.3 lb

## 2022-05-08 VITALS — BP 110/72 | HR 70 | Temp 97.8°F | Resp 16 | Ht 66.0 in | Wt 132.0 lb

## 2022-05-08 DIAGNOSIS — E785 Hyperlipidemia, unspecified: Secondary | ICD-10-CM | POA: Diagnosis not present

## 2022-05-08 DIAGNOSIS — I700 Atherosclerosis of aorta: Secondary | ICD-10-CM

## 2022-05-08 DIAGNOSIS — Z79899 Other long term (current) drug therapy: Secondary | ICD-10-CM | POA: Diagnosis not present

## 2022-05-08 DIAGNOSIS — E559 Vitamin D deficiency, unspecified: Secondary | ICD-10-CM | POA: Diagnosis not present

## 2022-05-08 DIAGNOSIS — Z0000 Encounter for general adult medical examination without abnormal findings: Secondary | ICD-10-CM

## 2022-05-08 NOTE — Progress Notes (Deleted)
Name: Denise Perez   MRN: [id number]    DOB: [birth year]   Date:05/08/2022       Progress Note  Subjective  Chief Complaint  Follow Up  HPI  [place] COVID-19: she was diagnosed in May 22  and took Paxlovid, her symptoms were very mild, however she states since COVID-19 she has noticed decrease in exercise tolerance. Mostly when going to gym, or walking a long distance, but also after a shower. No chest pain or palpitation. She states gradually improving but discussed referral to cardiologist if she changes her mind. She states cough resolved.   Age related osteoporosis: explained that was slight progression of osteoporosis and discussed medications options, she has been physically active - going to the gym 4-5 times a week. We will recheck bone density today    Dyslipidemia: she is only on life style modification, since last visit she had CT abdomen pelvis that showed atherosclerosis of aorta.  She declines starting statin, but has been active, healthy diet and now added Avocado . She was on aspirin but stopped a couple of months ago, discussed USPTF and she will take it only a few times a week from now on    Hair loss: she noticed thinning on top of her scalp over the past several years, she was seen by Dr. Evorn Gong and will start Minoxidil now    Patient Active Problem List   Diagnosis Date Noted   Chest tightness 06/24/2021   Atherosclerosis of aorta ([place]) 05/07/2020   FH: colon cancer 05/30/2019   Dyslipidemia 05/28/2016   Osteoporosis 05/28/2016   Cervical disc disease 05/28/2016   Primary osteoarthritis involving multiple joints 06/11/2015   Synovial cyst of wrist 06/11/2015    Past Surgical History:  Procedure Laterality Date   COLONOSCOPY WITH PROPOFOL N/A 08/13/2015   Procedure: COLONOSCOPY WITH PROPOFOL;  Surgeon: Hulen Luster, MD;  Location: [facility] ENDOSCOPY;  Service: Gastroenterology;  Laterality: N/A;   COLONOSCOPY WITH PROPOFOL N/A 06/29/2019   Procedure: COLONOSCOPY  WITH PROPOFOL;  Surgeon: Toledo, Benay Pike, MD;  Location: [facility] ENDOSCOPY;  Service: Gastroenterology;  Laterality: N/A;   ESOPHAGOGASTRODUODENOSCOPY (EGD) WITH PROPOFOL     ESOPHAGOGASTRODUODENOSCOPY (EGD) WITH PROPOFOL N/A 06/29/2019   Procedure: ESOPHAGOGASTRODUODENOSCOPY (EGD) WITH PROPOFOL;  Surgeon: Toledo, Benay Pike, MD;  Location: [facility] ENDOSCOPY;  Service: Gastroenterology;  Laterality: N/A;   GUM SURGERY     TOOTH EXTRACTION      Family History  Problem Relation Age of Onset   Cancer Father        Colon   Breast cancer Neg Hx     Social History   Tobacco Use   Smoking status: Former    Packs/day: 1.50    Years: 15.00    Total pack years: 22.50    Types: Cigarettes    Start date: 11/17/1970    Quit date: 11/17/1985    Years since quitting: 36.4   Smokeless tobacco: Never   Tobacco comments:    smoking cessation materials not required  Substance Use Topics   Alcohol use: Not Currently    Alcohol/week: 0.0 standard drinks of alcohol    Comment: 5 glasses of wine a year     Current Outpatient Medications:    Cholecalciferol (VITAMIN D) 2000 units CAPS, Take 1 capsule (2,000 Units total) by mouth daily., Disp: 30 capsule, Rfl: 0   Omega-3 Fatty Acids (FISH OIL) 1000 MG CAPS, Take 1 capsule by mouth daily., Disp: , Rfl:    Turmeric 400  MG CAPS, Take 1 capsule by mouth daily., Disp: , Rfl:   Allergies  Allergen Reactions   Prednisone Other (See Comments)    Shaking    I personally reviewed active problem list, medication list, allergies, family history, social history, health maintenance with the patient/caregiver today.   ROS  ***  Objective  There were no vitals filed for this visit.  There is no height or weight on file to calculate BMI.  Physical Exam ***  No results found for this or any previous visit (from the past 2160 hour(s)).   PHQ2/9:    05/08/2022    8:51 AM 05/08/2022    8:39 AM 01/14/2022    2:37 PM 06/24/2021    3:01 PM 05/07/2021     8:50 AM  Depression screen PHQ 2/9  Decreased Interest 0 0 0 0 0  Down, Depressed, Hopeless 0 0 0 0 0  PHQ - 2 Score 0 0 0 0 0    phq 9 is negative   Fall Risk:    05/08/2022    8:51 AM 05/08/2022    8:38 AM 01/14/2022    2:36 PM 06/24/2021    3:01 PM 05/07/2021    8:51 AM  Fall Risk   Falls in the past year? 0 0 0 0 0  Number falls in past yr: 0 0 0 0 0  Injury with Fall? 0 0 0 0 0  Risk for fall due to : No Fall Risks    No Fall Risks  Follow up Falls prevention discussed Falls evaluation completed Falls evaluation completed Falls evaluation completed Falls prevention discussed      Functional Status Survey:      Assessment & Plan  *** There are no diagnoses linked to this encounter.

## 2022-05-08 NOTE — Progress Notes (Deleted)
Established Patient Office Visit  Name: Denise Perez   MRN: [id number]    DOB: [birth year]   Date:05/08/2022  Today's Provider: Talitha Givens, MHS, PA-C Introduced myself to the patient as a PA-C and provided education on APPs in clinical practice.         Subjective  Chief Complaint  No chief complaint on file.   HPI   Patient Active Problem List   Diagnosis Date Noted   Chest tightness 06/24/2021   Atherosclerosis of aorta ([place]) 05/07/2020   FH: colon cancer 05/30/2019   Dyslipidemia 05/28/2016   Osteoporosis 05/28/2016   Cervical disc disease 05/28/2016   Primary osteoarthritis involving multiple joints 06/11/2015   Synovial cyst of wrist 06/11/2015    Past Surgical History:  Procedure Laterality Date   COLONOSCOPY WITH PROPOFOL N/A 08/13/2015   Procedure: COLONOSCOPY WITH PROPOFOL;  Surgeon: Hulen Luster, MD;  Location: [facility] ENDOSCOPY;  Service: Gastroenterology;  Laterality: N/A;   COLONOSCOPY WITH PROPOFOL N/A 06/29/2019   Procedure: COLONOSCOPY WITH PROPOFOL;  Surgeon: Toledo, Benay Pike, MD;  Location: [facility] ENDOSCOPY;  Service: Gastroenterology;  Laterality: N/A;   ESOPHAGOGASTRODUODENOSCOPY (EGD) WITH PROPOFOL     ESOPHAGOGASTRODUODENOSCOPY (EGD) WITH PROPOFOL N/A 06/29/2019   Procedure: ESOPHAGOGASTRODUODENOSCOPY (EGD) WITH PROPOFOL;  Surgeon: Toledo, Benay Pike, MD;  Location: [facility] ENDOSCOPY;  Service: Gastroenterology;  Laterality: N/A;   GUM SURGERY     TOOTH EXTRACTION      Family History  Problem Relation Age of Onset   Cancer Father        Colon   Breast cancer Neg Hx     Social History   Tobacco Use   Smoking status: Former    Packs/day: 1.50    Years: 15.00    Total pack years: 22.50    Types: Cigarettes    Start date: 11/17/1970    Quit date: 11/17/1985    Years since quitting: 36.4   Smokeless tobacco: Never   Tobacco comments:    smoking cessation materials not required  Substance Use Topics   Alcohol use: Not Currently     Alcohol/week: 0.0 standard drinks of alcohol    Comment: 5 glasses of wine a year     Current Outpatient Medications:    Cholecalciferol (VITAMIN D) 2000 units CAPS, Take 1 capsule (2,000 Units total) by mouth daily., Disp: 30 capsule, Rfl: 0   Omega-3 Fatty Acids (FISH OIL) 1000 MG CAPS, Take 1 capsule by mouth daily., Disp: , Rfl:    Turmeric 400 MG CAPS, Take 1 capsule by mouth daily., Disp: , Rfl:   Allergies  Allergen Reactions   Prednisone Other (See Comments)    Shaking    I personally reviewed {[id number]} with the patient/caregiver today.   ROS    Objective  Vitals:   05/08/22 0827  BP: 110/72  Pulse: 70  Resp: 16  Temp: 97.8 F (36.6 C)  TempSrc: Oral  SpO2: 98%  Weight: 132 lb 4.8 oz (60 kg)  Height: '5\' 6"'$  (1.676 m)    Body mass index is 21.35 kg/m.  Physical Exam   No results found for this or any previous visit (from the past 2160 hour(s)).   PHQ2/9:    05/08/2022    8:51 AM 05/08/2022    8:39 AM 01/14/2022    2:37 PM 06/24/2021    3:01 PM 05/07/2021    8:50 AM  Depression screen PHQ 2/9  Decreased Interest 0 0 0 0 0  Down, Depressed, Hopeless 0 0 0 0 0  PHQ - 2 Score 0 0 0 0 0      Fall Risk:    05/08/2022    8:51 AM 05/08/2022    8:38 AM 01/14/2022    2:36 PM 06/24/2021    3:01 PM 05/07/2021    8:51 AM  [place] in the past year? 0 0 0 0 0  Number falls in past yr: 0 0 0 0 0  Injury with Fall? 0 0 0 0 0  Risk for fall due to : No Fall Risks    No Fall Risks  Follow up Falls prevention discussed Falls evaluation completed Falls evaluation completed Falls evaluation completed Falls prevention discussed      Functional Status Survey: Is the patient deaf or have difficulty hearing?: No Does the patient have difficulty seeing, even when wearing glasses/contacts?: No Does the patient have difficulty concentrating, remembering, or making decisions?: No Does the patient have difficulty walking or climbing stairs?: No Does  the patient have difficulty dressing or bathing?: No Does the patient have difficulty doing errands alone such as visiting a doctor's office or shopping?: No    Assessment & Plan

## 2022-05-08 NOTE — Progress Notes (Deleted)
Patient ID: Denise Perez, female   DOB: 08/30/1951, 71 y.o.   MRN: [id number]

## 2022-05-08 NOTE — Patient Instructions (Addendum)
Thank you for taking time to come for your Medicare Wellness Visit. I appreciate your ongoing commitment to your health goals. Please review the following plan we discussed and let me know if I can assist you in the future.   Screening recommendations/referrals: Colonoscopy: Due in 2025 Mammogram: Due in July 2023.  Bone Density: Due next year. Due every 2 years.  Recommended yearly ophthalmology/optometry visit for glaucoma screening and checkup Recommended yearly dental visit for hygiene and checkup  Vaccinations: Influenza vaccine: Due in the fall Pneumococcal vaccine: completed  Tdap vaccine: Due in 2024 Shingles vaccine:   Completed Covid-19: Completed     Next appointment: Follow up in one year for your annual wellness visit    Preventive Care 72 Years and Older, Female Preventive care refers to lifestyle choices and visits with your health care provider that can promote health and wellness. What does preventive care include? A yearly physical exam. This is also called an annual well check. Dental exams once or twice a year. Routine eye exams. Ask your health care provider how often you should have your eyes checked. Personal lifestyle choices, including: Daily care of your teeth and gums. Regular physical activity. Eating a healthy diet. Avoiding tobacco and drug use. Limiting alcohol use. Practicing safe sex. Taking low-dose aspirin every day. Taking vitamin and mineral supplements as recommended by your health care provider. What happens during an annual well check? The services and screenings done by your health care provider during your annual well check will depend on your age, overall health, lifestyle risk factors, and family history of disease. Counseling  Your health care provider may ask you questions about your: Alcohol use. Tobacco use. Drug use. Emotional well-being. Home and relationship well-being. Sexual activity. Eating habits. History of  falls. Memory and ability to understand (cognition). Work and work Statistician. Reproductive health. Screening  You may have the following tests or measurements: Height, weight, and BMI. Blood pressure. Lipid and cholesterol levels. These may be checked every 5 years, or more frequently if you are over 22 years old. Skin check. Lung cancer screening. You may have this screening every year starting at age 72 if you have a 30-pack-year history of smoking and currently smoke or have quit within the past 15 years. Fecal occult blood test (FOBT) of the stool. You may have this test every year starting at age 72. Flexible sigmoidoscopy or colonoscopy. You may have a sigmoidoscopy every 5 years or a colonoscopy every 10 years starting at age 72. Hepatitis C blood test. Hepatitis B blood test. Sexually transmitted disease (STD) testing. Diabetes screening. This is done by checking your blood sugar (glucose) after you have not eaten for a while (fasting). You may have this done every 1-3 years. Bone density scan. This is done to screen for osteoporosis. You may have this done starting at age 72. Mammogram. This may be done every 1-2 years. Talk to your health care provider about how often you should have regular mammograms. Talk with your health care provider about your test results, treatment options, and if necessary, the need for more tests. Vaccines  Your health care provider may recommend certain vaccines, such as: Influenza vaccine. This is recommended every year. Tetanus, diphtheria, and acellular pertussis (Tdap, Td) vaccine. You may need a Td booster every 10 years. Zoster vaccine. You may need this after age 72. Pneumococcal 13-valent conjugate (PCV13) vaccine. One dose is recommended after age 72. Pneumococcal polysaccharide (PPSV23) vaccine. One dose is recommended after age 72.  Talk to your health care provider about which screenings and vaccines you need and how often you need  them. This information is not intended to replace advice given to you by your health care provider. Make sure you discuss any questions you have with your health care provider. Document Released: 11/30/2015 Document Revised: 07/23/2016 Document Reviewed: 09/04/2015 Elsevier Interactive Patient Education  2017 [place] Prevention in the Home Falls can cause injuries. They can happen to people of all ages. There are many things you can do to make your home safe and to help prevent falls. What can I do on the outside of my home? Regularly fix the edges of walkways and driveways and fix any cracks. Remove anything that might make you trip as you walk through a door, such as a raised step or threshold. Trim any bushes or trees on the path to your home. Use bright outdoor lighting. Clear any walking paths of anything that might make someone trip, such as rocks or tools. Regularly check to see if handrails are loose or broken. Make sure that both sides of any steps have handrails. Any raised decks and porches should have guardrails on the edges. Have any leaves, snow, or ice cleared regularly. Use sand or salt on walking paths during winter. Clean up any spills in your garage right away. This includes oil or grease spills. What can I do in the bathroom? Use night lights. Install grab bars by the toilet and in the tub and shower. Do not use towel bars as grab bars. Use non-skid mats or decals in the tub or shower. If you need to sit down in the shower, use a plastic, non-slip stool. Keep the floor dry. Clean up any water that spills on the floor as soon as it happens. Remove soap buildup in the tub or shower regularly. Attach bath mats securely with double-sided non-slip rug tape. Do not have throw rugs and other things on the floor that can make you trip. What can I do in the bedroom? Use night lights. Make sure that you have a light by your bed that is easy to reach. Do not use  any sheets or blankets that are too big for your bed. They should not hang down onto the floor. Have a firm chair that has side arms. You can use this for support while you get dressed. Do not have throw rugs and other things on the floor that can make you trip. What can I do in the kitchen? Clean up any spills right away. Avoid walking on wet floors. Keep items that you use a lot in easy-to-reach places. If you need to reach something above you, use a strong step stool that has a grab bar. Keep electrical cords out of the way. Do not use floor polish or wax that makes floors slippery. If you must use wax, use non-skid floor wax. Do not have throw rugs and other things on the floor that can make you trip. What can I do with my stairs? Do not leave any items on the stairs. Make sure that there are handrails on both sides of the stairs and use them. Fix handrails that are broken or loose. Make sure that handrails are as long as the stairways. Check any carpeting to make sure that it is firmly attached to the stairs. Fix any carpet that is loose or worn. Avoid having throw rugs at the top or bottom of the stairs. If you do have throw  rugs, attach them to the floor with carpet tape. Make sure that you have a light switch at the top of the stairs and the bottom of the stairs. If you do not have them, ask someone to add them for you. What else can I do to help prevent falls? Wear shoes that: Do not have high heels. Have rubber bottoms. Are comfortable and fit you well. Are closed at the toe. Do not wear sandals. If you use a stepladder: Make sure that it is fully opened. Do not climb a closed stepladder. Make sure that both sides of the stepladder are locked into place. Ask someone to hold it for you, if possible. Clearly mark and make sure that you can see: Any grab bars or handrails. First and last steps. Where the edge of each step is. Use tools that help you move around (mobility aids)  if they are needed. These include: Canes. Walkers. Scooters. Crutches. Turn on the lights when you go into a dark area. Replace any light bulbs as soon as they burn out. Set up your furniture so you have a clear path. Avoid moving your furniture around. If any of your floors are uneven, fix them. If there are any pets around you, be aware of where they are. Review your medicines with your doctor. Some medicines can make you feel dizzy. This can increase your chance of falling. Ask your doctor what other things that you can do to help prevent falls. This information is not intended to replace advice given to you by your health care provider. Make sure you discuss any questions you have with your health care provider. Document Released: 08/30/2009 Document Revised: 04/10/2016 Document Reviewed: 12/08/2014 Elsevier Interactive Patient Education  2017 [employer].

## 2022-05-09 ENCOUNTER — Encounter: Payer: Self-pay | Admitting: Family Medicine

## 2022-05-09 ENCOUNTER — Ambulatory Visit (INDEPENDENT_AMBULATORY_CARE_PROVIDER_SITE_OTHER): Payer: PPO | Admitting: Family Medicine

## 2022-05-09 VITALS — BP 110/72 | HR 70 | Resp 16 | Ht 66.0 in | Wt 132.0 lb

## 2022-05-09 DIAGNOSIS — Z1231 Encounter for screening mammogram for malignant neoplasm of breast: Secondary | ICD-10-CM | POA: Diagnosis not present

## 2022-05-09 DIAGNOSIS — E559 Vitamin D deficiency, unspecified: Secondary | ICD-10-CM | POA: Insufficient documentation

## 2022-05-09 DIAGNOSIS — I700 Atherosclerosis of aorta: Secondary | ICD-10-CM | POA: Diagnosis not present

## 2022-05-09 DIAGNOSIS — U099 Post covid-19 condition, unspecified: Secondary | ICD-10-CM | POA: Insufficient documentation

## 2022-05-09 DIAGNOSIS — E785 Hyperlipidemia, unspecified: Secondary | ICD-10-CM

## 2022-05-09 DIAGNOSIS — M810 Age-related osteoporosis without current pathological fracture: Secondary | ICD-10-CM

## 2022-05-09 LAB — CBC WITH DIFFERENTIAL/PLATELET
Absolute Monocytes: 428 cells/uL (ref 200–950)
Basophils Absolute: 41 cells/uL (ref 0–200)
Basophils Relative: 0.8 %
Eosinophils Absolute: 82 cells/uL (ref 15–500)
Eosinophils Relative: 1.6 %
HCT: 41.3 % (ref 35.0–45.0)
Hemoglobin: 14.5 g/dL (ref 11.7–15.5)
Lymphs Abs: 1530 cells/uL (ref 850–3900)
MCH: 32.5 pg (ref 27.0–33.0)
MCHC: 35.1 g/dL (ref 32.0–36.0)
MCV: 92.6 fL (ref 80.0–100.0)
MPV: 10.1 fL (ref 7.5–12.5)
Monocytes Relative: 8.4 %
Neutro Abs: 3019 cells/uL (ref 1500–7800)
Neutrophils Relative %: 59.2 %
Platelets: 250 10*3/uL (ref 140–400)
RBC: 4.46 10*6/uL (ref 3.80–5.10)
RDW: 13.4 % (ref 11.0–15.0)
Total Lymphocyte: 30 %
WBC: 5.1 10*3/uL (ref 3.8–10.8)

## 2022-05-09 LAB — LIPID PANEL
Cholesterol: 250 mg/dL — ABNORMAL HIGH (ref <200)
HDL: 82 mg/dL (ref >=50)
LDL Cholesterol (Calc): 153 mg/dL (calc) — ABNORMAL HIGH
Non-HDL Cholesterol (Calc): 168 mg/dL (calc) — ABNORMAL HIGH (ref <130)
Total CHOL/HDL Ratio: 3 (calc) (ref <5.0)
Triglycerides: 62 mg/dL (ref <150)

## 2022-05-09 LAB — COMPLETE METABOLIC PANEL WITH GFR
AG Ratio: 1.6 (calc) (ref 1.0–2.5)
ALT: 15 U/L (ref 6–29)
AST: 22 U/L (ref 10–35)
Albumin: 4 g/dL (ref 3.6–5.1)
Alkaline phosphatase (APISO): 99 U/L (ref 37–153)
BUN: 13 mg/dL (ref 7–25)
CO2: 24 mmol/L (ref 20–32)
Calcium: 9 mg/dL (ref 8.6–10.4)
Chloride: 107 mmol/L (ref 98–110)
Creat: 0.86 mg/dL (ref 0.60–1.00)
Globulin: 2.5 g/dL (calc) (ref 1.9–3.7)
Glucose, Bld: 80 mg/dL (ref 65–99)
Potassium: 4.3 mmol/L (ref 3.5–5.3)
Sodium: 142 mmol/L (ref 135–146)
Total Bilirubin: 0.6 mg/dL (ref 0.2–1.2)
Total Protein: 6.5 g/dL (ref 6.1–8.1)
eGFR: 72 mL/min/{1.73_m2} (ref >=60)

## 2022-05-09 LAB — VITAMIN D 25 HYDROXY (VIT D DEFICIENCY, FRACTURES): Vit D, 25-Hydroxy: 45 ng/mL (ref 30–100)

## 2022-06-26 ENCOUNTER — Ambulatory Visit
Admission: RE | Admit: 2022-06-26 | Discharge: 2022-06-26 | Disposition: A | Discharge status: Home / Self Care | Payer: PPO | Source: Ambulatory Visit | Attending: Family Medicine | Admitting: Family Medicine

## 2022-06-26 DIAGNOSIS — Z1231 Encounter for screening mammogram for malignant neoplasm of breast: Secondary | ICD-10-CM | POA: Insufficient documentation

## 2022-09-11 DIAGNOSIS — D2272 Melanocytic nevi of left lower limb, including hip: Secondary | ICD-10-CM | POA: Diagnosis not present

## 2022-09-11 DIAGNOSIS — D2262 Melanocytic nevi of left upper limb, including shoulder: Secondary | ICD-10-CM | POA: Diagnosis not present

## 2022-09-11 DIAGNOSIS — L661 Lichen planopilaris: Secondary | ICD-10-CM | POA: Diagnosis not present

## 2022-09-11 DIAGNOSIS — D2261 Melanocytic nevi of right upper limb, including shoulder: Secondary | ICD-10-CM | POA: Diagnosis not present

## 2022-09-11 DIAGNOSIS — L658 Other specified nonscarring hair loss: Secondary | ICD-10-CM | POA: Diagnosis not present

## 2022-09-11 DIAGNOSIS — L821 Other seborrheic keratosis: Secondary | ICD-10-CM | POA: Diagnosis not present

## 2022-09-11 DIAGNOSIS — D225 Melanocytic nevi of trunk: Secondary | ICD-10-CM | POA: Diagnosis not present

## 2022-09-11 DIAGNOSIS — D2271 Melanocytic nevi of right lower limb, including hip: Secondary | ICD-10-CM | POA: Diagnosis not present

## 2022-10-12 ENCOUNTER — Encounter: Payer: Self-pay | Admitting: Emergency Medicine

## 2022-10-12 ENCOUNTER — Ambulatory Visit
Admission: EM | Admit: 2022-10-12 | Discharge: 2022-10-12 | Disposition: A | Discharge status: Home / Self Care | Payer: PPO | Attending: Emergency Medicine | Admitting: Emergency Medicine

## 2022-10-12 DIAGNOSIS — J069 Acute upper respiratory infection, unspecified: Secondary | ICD-10-CM | POA: Diagnosis not present

## 2022-10-12 DIAGNOSIS — Z1152 Encounter for screening for COVID-19: Secondary | ICD-10-CM | POA: Diagnosis not present

## 2022-10-12 LAB — RESP PANEL BY RT-PCR (FLU A&B, COVID) ARPGX2
Influenza A by PCR: NEGATIVE
Influenza B by PCR: NEGATIVE
SARS Coronavirus 2 by RT PCR: NEGATIVE

## 2022-10-12 MED ORDER — PROMETHAZINE-DM 6.25-15 MG/5ML PO SYRP: 5.0000 mL | ORAL_SOLUTION | Freq: Four times a day (QID) | ORAL | 0 refills | Status: DC | PRN

## 2022-10-12 MED ORDER — BENZONATATE 100 MG PO CAPS: 100.0000 mg | ORAL_CAPSULE | Freq: Three times a day (TID) | ORAL | 0 refills | Status: DC

## 2022-10-12 NOTE — ED Provider Notes (Signed)
[facility]    CSN: [id number] Arrival date & time: 10/12/22  0843      History   Chief Complaint Chief Complaint  Patient presents with   Cough    HPI Denise Perez is a 71 y.o. female.   Patient presents with nasal congestion, rhinorrhea, sore throat and nonproductive cough for 7 days.  Sore throat has resolved.  Has been managing symptoms for the last 2 days with hot Toddy's which has been effective in helping her sleep.  Endorses shortness of breath at baseline, has not worsened, endorses long COVID since May 2022.  Has attempted additional use of cough drops which have been minimally effective.  Tolerating food and liquids.  No known sick contacts.  Non-smoker.   Past Medical History:  Diagnosis Date   Cervical disc disease    Cervical neuropathy    GERD (gastroesophageal reflux disease)    Osteoporosis    Tietze's disease     Patient Active Problem List   Diagnosis Date Noted   Vitamin D deficiency 05/09/2022   COVID-19 long hauler 05/09/2022   Atherosclerosis of aorta ([place]) 05/07/2020   FH: colon cancer 05/30/2019   Dyslipidemia 05/28/2016   Osteoporosis 05/28/2016   Cervical disc disease 05/28/2016   Primary osteoarthritis involving multiple joints 06/11/2015   Synovial cyst of wrist 06/11/2015    Past Surgical History:  Procedure Laterality Date   COLONOSCOPY WITH PROPOFOL N/A 08/13/2015   Procedure: COLONOSCOPY WITH PROPOFOL;  Surgeon: Hulen Luster, MD;  Location: [facility] ENDOSCOPY;  Service: Gastroenterology;  Laterality: N/A;   COLONOSCOPY WITH PROPOFOL N/A 06/29/2019   Procedure: COLONOSCOPY WITH PROPOFOL;  Surgeon: Toledo, Benay Pike, MD;  Location: [facility] ENDOSCOPY;  Service: Gastroenterology;  Laterality: N/A;   ESOPHAGOGASTRODUODENOSCOPY (EGD) WITH PROPOFOL     ESOPHAGOGASTRODUODENOSCOPY (EGD) WITH PROPOFOL N/A 06/29/2019   Procedure: ESOPHAGOGASTRODUODENOSCOPY (EGD) WITH PROPOFOL;  Surgeon: Toledo, Benay Pike, MD;  Location: [facility] ENDOSCOPY;   Service: Gastroenterology;  Laterality: N/A;   GUM SURGERY     TOOTH EXTRACTION      OB History     Gravida  1   Para      Term      Preterm      AB  1   Living         SAB      IAB  1   Ectopic      Multiple      Live Births               Home Medications    Prior to Admission medications   Medication Sig Start Date End Date Taking? Authorizing Provider  Cholecalciferol (VITAMIN D) 2000 units CAPS Take 1 capsule (2,000 Units total) by mouth daily. 07/07/18  Yes Sowles, Drue Stager, MD  Omega-3 Fatty Acids (FISH OIL) 1000 MG CAPS Take 1 capsule by mouth daily.   Yes [provider]  Turmeric 400 MG CAPS Take 1 capsule by mouth daily.   Yes [provider]    Family History Family History  Problem Relation Age of Onset   Cancer Father        Colon   Breast cancer Neg Hx     Social History Social History   Tobacco Use   Smoking status: Former    Packs/day: 1.50    Years: 15.00    Total pack years: 22.50    Types: Cigarettes    Start date: 11/17/1970    Quit date: 11/17/1985  Years since quitting: 36.9   Smokeless tobacco: Never   Tobacco comments:    smoking cessation materials not required  Vaping Use   Vaping Use: Never used  Substance Use Topics   Alcohol use: Not Currently    Alcohol/week: 0.0 standard drinks of alcohol    Comment: 5 glasses of wine a year   Drug use: No     Allergies   Prednisone   Review of Systems Review of Systems  Constitutional: Negative.   HENT:  Positive for congestion, rhinorrhea and sore throat. Negative for dental problem, drooling, ear discharge, ear pain, facial swelling, hearing loss, mouth sores, nosebleeds, postnasal drip, sinus pressure, sinus pain, sneezing, tinnitus, trouble swallowing and voice change.   Respiratory:  Positive for cough. Negative for apnea, choking, chest tightness, shortness of breath, wheezing and stridor.   Cardiovascular: Negative.   Gastrointestinal:  Negative.   Neurological: Negative.      Physical Exam Triage Vital Signs ED Triage Vitals  Enc Vitals Group     BP 10/12/22 0912 (!) 154/84     Pulse Rate 10/12/22 0912 72     Resp 10/12/22 0912 14     Temp 10/12/22 0912 98.5 F (36.9 C)     Temp Source 10/12/22 0912 Oral     SpO2 10/12/22 0912 96 %     Weight 10/12/22 0910 130 lb (59 kg)     Height 10/12/22 0910 '5\' 6"'$  (1.676 m)     Head Circumference --      Peak Flow --      Pain Score 10/12/22 0910 0     Pain Loc --      Pain Edu? --      Excl. in [place]? --    No data found.  Updated Vital Signs BP (!) 154/84 (BP Location: Right Arm)   Pulse 72   Temp 98.5 F (36.9 C) (Oral)   Resp 14   Ht '5\' 6"'$  (1.676 m)   Wt 130 lb (59 kg)   SpO2 96%   BMI 20.98 kg/m   Visual Acuity Right Eye Distance:   Left Eye Distance:   Bilateral Distance:    Right Eye Near:   Left Eye Near:    Bilateral Near:     Physical Exam Constitutional:      Appearance: Normal appearance.  HENT:     Head: Normocephalic.     Right Ear: Tympanic membrane, ear canal and external ear normal.     Left Ear: Tympanic membrane, ear canal and external ear normal.     Nose: Congestion and rhinorrhea present.     Mouth/Throat:     Mouth: Mucous membranes are moist.     Pharynx: Oropharynx is clear.  Eyes:     Extraocular Movements: Extraocular movements intact.  Cardiovascular:     Rate and Rhythm: Normal rate and regular rhythm.     Pulses: Normal pulses.     Heart sounds: Normal heart sounds.  Pulmonary:     Effort: Pulmonary effort is normal.     Breath sounds: Normal breath sounds.  Skin:    General: Skin is warm and dry.  Neurological:     Mental Status: She is alert and oriented to person, place, and time. Mental status is at baseline.  Psychiatric:        Mood and Affect: Mood normal.        Behavior: Behavior normal.      UC Treatments / Results  Labs (all labs  ordered are listed, but only abnormal results are  displayed) Labs Reviewed  RESP PANEL BY RT-PCR (FLU A&B, COVID) [id number]    EKG   Radiology No results found.  Procedures Procedures (including critical care time)  Medications Ordered in UC Medications - No data to display  Initial Impression / Assessment and Plan / UC Course  I have reviewed the triage vital signs and the nursing notes.  Pertinent labs & imaging results that were available during my care of the patient were reviewed by me and considered in my medical decision making (see chart for details).  Viral URI with cough  Vital signs stable patient has no signs of distress, O2 saturation 96% on room air lungs are clear to auscultation defer imaging deferred, COVID and flu testing negative, prescribed Tessalon and Promethazine DM cough is most worrisome symptoms today, may attempt use of additional over-the-counter medication as needed, may follow-up with urgent care as needed if symptoms persist or worsen Final Clinical Impressions(s) / UC Diagnoses   Final diagnoses:  None   Discharge Instructions   None    ED Prescriptions   None    PDMP not reviewed this encounter.   Hans Eden, NP 10/12/22 1013

## 2022-10-12 NOTE — Discharge Instructions (Signed)
Your symptoms today are most likely being caused by a virus and should steadily improve in time it can take up to 7 to 10 days before you truly start to see a turnaround however things will get better  COVID and flu testing negative  May use Tessalon pill every 8 hours to help calm your coughing  May use cough syrup every 6 hours as needed for additional comfort, be mindful this medication may make you drowsy    You can take Tylenol and/or Ibuprofen as needed for fever reduction and pain relief.   For cough: honey 1/2 to 1 teaspoon (you can dilute the honey in water or another fluid).  You can also use guaifenesin and dextromethorphan for cough. You can use a humidifier for chest congestion and cough.  If you don't have a humidifier, you can sit in the bathroom with the hot shower running.      For sore throat: try warm salt water gargles, cepacol lozenges, throat spray, warm tea or water with lemon/honey, popsicles or ice, or OTC cold relief medicine for throat discomfort.   For congestion: take a daily anti-histamine like Zyrtec, Claritin, and a oral decongestant, such as pseudoephedrine.  You can also use Flonase 1-2 sprays in each nostril daily.   It is important to stay hydrated: drink plenty of fluids (water, gatorade/powerade/pedialyte, juices, or teas) to keep your throat moisturized and help further relieve irritation/discomfort.

## 2022-10-12 NOTE — ED Triage Notes (Addendum)
Patient c/o cough that started on Tuesday.  Patient denies fevers.   Patient states that she had COVID in May of 2022.

## 2022-11-04 ENCOUNTER — Ambulatory Visit (INDEPENDENT_AMBULATORY_CARE_PROVIDER_SITE_OTHER): Payer: PPO | Admitting: Physician Assistant

## 2022-11-04 ENCOUNTER — Encounter: Payer: Self-pay | Admitting: Physician Assistant

## 2022-11-04 VITALS — BP 110/74 | HR 100 | Temp 98.3°F | Resp 16 | Ht 66.0 in | Wt 132.2 lb

## 2022-11-04 DIAGNOSIS — J069 Acute upper respiratory infection, unspecified: Secondary | ICD-10-CM | POA: Diagnosis not present

## 2022-11-04 DIAGNOSIS — R059 Cough, unspecified: Secondary | ICD-10-CM

## 2022-11-04 DIAGNOSIS — R0981 Nasal congestion: Secondary | ICD-10-CM

## 2022-11-04 NOTE — Progress Notes (Unsigned)
Acute Office Visit   Patient: Denise Perez   DOB: 1951-05-18   71 y.o. Female  MRN: [id number] Visit Date: 11/04/2022  Today's healthcare provider: Dani Gobble Brayleigh Rybacki, PA-C  Introduced myself to the patient as a Journalist, newspaper and provided education on APPs in clinical practice.    Chief Complaint  Patient presents with   Cough   Nasal Congestion   Subjective    HPI   Elevated Temperature  Tmax: 100.3  Associated Symptoms; cough, chills, runny nose, intermittent scratchy throat  Reports cough is not productive  COVID testing at home: took a test yesterday and was negative  Recent sick contacts: yes  Onset: sudden  Duration: Sunday night start (3 days) Interventions: 1-2 aspirins  Had difficulty sleeping Sunday night along with headache but this resolved     Medications: Outpatient Medications Prior to Visit  Medication Sig   benzonatate (TESSALON) 100 MG capsule Take 1 capsule (100 mg total) by mouth every 8 (eight) hours.   Cholecalciferol (VITAMIN D) 2000 units CAPS Take 1 capsule (2,000 Units total) by mouth daily.   Omega-3 Fatty Acids (FISH OIL) 1000 MG CAPS Take 1 capsule by mouth daily.   promethazine-dextromethorphan (PROMETHAZINE-DM) 6.25-15 MG/5ML syrup Take 5 mLs by mouth 4 (four) times daily as needed for cough.   Turmeric 400 MG CAPS Take 1 capsule by mouth daily.   No facility-administered medications prior to visit.    Review of Systems  Constitutional:  Positive for chills and fever. Negative for fatigue.  HENT:  Positive for rhinorrhea and sore throat. Negative for congestion, ear pain, postnasal drip, sinus pressure and sinus pain.   Respiratory:  Positive for cough. Negative for shortness of breath and wheezing.   Gastrointestinal:  Negative for diarrhea, nausea and vomiting.  Musculoskeletal:  Negative for myalgias.  Neurological:  Negative for dizziness, light-headedness and headaches.    {Labs  Heme  Chem  Endocrine  Serology  Results  Review (optional):23779}   Objective    BP 110/74   Pulse 100   Temp 98.3 F (36.8 C) (Oral)   Resp 16   Ht '5\' 6"'$  (1.676 m)   Wt 132 lb 3.2 oz (60 kg)   SpO2 98%   BMI 21.34 kg/m  {Show previous vital signs (optional):23777}  Physical Exam Vitals reviewed.  Constitutional:      General: She is awake.     Appearance: Normal appearance. She is well-developed and well-groomed.  HENT:     Head: Normocephalic and atraumatic.     Right Ear: Tympanic membrane, ear canal and external ear normal.     Left Ear: Tympanic membrane, ear canal and external ear normal.     Mouth/Throat:     Lips: Pink.     Mouth: Mucous membranes are moist.     Pharynx: Oropharynx is clear. Uvula midline. Posterior oropharyngeal erythema present. No oropharyngeal exudate or uvula swelling.  Eyes:     Extraocular Movements: Extraocular movements intact.     Conjunctiva/sclera: Conjunctivae normal.  Cardiovascular:     Rate and Rhythm: Normal rate and regular rhythm.     Pulses: Normal pulses.          Radial pulses are 2+ on the right side and 2+ on the left side.     Heart sounds: Normal heart sounds.  Pulmonary:     Effort: Pulmonary effort is normal.     Breath sounds: Normal breath sounds. No decreased air  movement. No decreased breath sounds, wheezing, rhonchi or rales.  Musculoskeletal:     Cervical back: Normal range of motion.  Lymphadenopathy:     Head:     Right side of head: No submental, submandibular or preauricular adenopathy.     Left side of head: No submental, submandibular or preauricular adenopathy.     Cervical:     Right cervical: No superficial or posterior cervical adenopathy.    Left cervical: No superficial or posterior cervical adenopathy.     Upper Body:     Right upper body: No supraclavicular adenopathy.     Left upper body: No supraclavicular adenopathy.  Neurological:     Mental Status: She is alert.  Psychiatric:        Mood and Affect: Mood normal.         Behavior: Behavior normal. Behavior is cooperative.        Thought Content: Thought content normal.        Judgment: Judgment normal.       No results found for any visits on 11/04/22.  Assessment & Plan      No follow-ups on file.

## 2022-11-04 NOTE — Patient Instructions (Addendum)
Based on your described symptoms and the duration of symptoms it is likely that you have a viral upper respiratory infection (often called a "cold")  Symptoms can last for 3-10 days with lingering cough and intermittent symptoms lasting weeks after that.  The goal of treatment at this time is to reduce your symptoms and discomfort    You can use over the counter medications such as Dayquil/Nyquil, AlkaSeltzer formulations, etc to provide further relief of symptoms according to the manufacturer's instructions   You can take Ibuprofen in between these doses if you are still having a fever and body aches You can use the cough medication that you have left over from your recent visit  You can use a humidifier at night to help with any nasal irritation and prevent dryness   If preferred you can use Coricidin to manage your symptoms rather than those medications mentioned above.    If your symptoms do not improve or become worse in the next 5-7 days please make an apt at the office so we can see you  Go to the ER if you begin to have more serious symptoms such as shortness of breath, trouble breathing, loss of consciousness, swelling around the eyes, high fever, severe lasting headaches, vision changes or neck pain/stiffness.

## 2022-11-05 LAB — NOVEL CORONAVIRUS, NAA: SARS-CoV-2, NAA: DETECTED — AB

## 2022-11-06 ENCOUNTER — Telehealth: Payer: Self-pay | Admitting: Family Medicine

## 2022-11-06 NOTE — Telephone Encounter (Signed)
Copied from [place] [phone]. Topic: General - Other >> Nov 05, 2022  4:53 PM Ja-Kwan M wrote: Reason for CRM: Pt requests call back to go over her Covid test results. Pt stated its listed on [place] but she does not understand. Cb# [phone]

## 2022-11-06 NOTE — Telephone Encounter (Signed)
Patient called to review results and isolation recommendations. Patient advised her test for COVID is positive and she was advised per COVID protocol:  If a person HAS SYMPTOMS (feels sick): * Isolation time at home depends on how severe the COVID-19 symptoms are and if the person has a weak immune system. * Day 0 is the day symptoms began. * MILD COVID-19: Home isolation can end 5 days after symptoms started if (1) fever has been gone for 24 hours (without using fever medicine) AND (2) symptoms are better. Continue to wear a well-fitted mask for a full 10 days when around others. * MODERATE COVID-19 (E.G., BREATHING DIFFICULTY): Home isolation is needed for at least 10 days   Patient reports she only has cough and fatigue-patient temperature has returned to normal. Patient plans to follow mild symptoms protocol

## 2022-11-28 ENCOUNTER — Telehealth: Payer: Self-pay | Admitting: Family Medicine

## 2022-11-28 NOTE — Telephone Encounter (Signed)
Pt is calling to notify Dr. Ancil Boozer that she had her flu shot at [facility] 08/31/22. Please advise CB- [phone]

## 2022-12-01 NOTE — Telephone Encounter (Signed)
Abstracted

## 2023-01-07 ENCOUNTER — Encounter: Payer: Self-pay | Admitting: Internal Medicine

## 2023-01-07 ENCOUNTER — Ambulatory Visit (INDEPENDENT_AMBULATORY_CARE_PROVIDER_SITE_OTHER): Payer: PPO | Admitting: Internal Medicine

## 2023-01-07 VITALS — BP 118/64 | HR 108 | Temp 97.9°F | Resp 18 | Ht 66.0 in | Wt 134.9 lb

## 2023-01-07 DIAGNOSIS — J069 Acute upper respiratory infection, unspecified: Secondary | ICD-10-CM | POA: Diagnosis not present

## 2023-01-07 DIAGNOSIS — J029 Acute pharyngitis, unspecified: Secondary | ICD-10-CM | POA: Diagnosis not present

## 2023-01-07 NOTE — Progress Notes (Signed)
   Acute Office Visit  Subjective:     Patient ID: Denise Perez, female    DOB: 20-Oct-1951, 72 y.o.   MRN: [id number]  Chief Complaint  Patient presents with   Sinusitis    Onset 7 days sinus pressure/burning throat drainage with sore throat hoarsness    HPI Patient is in today for congestion. Symptoms started 1 week ago. Having burning pain in the frontal sinuses. Had [place] in December, [place] in 2021.  URI Compliant:  -Fever: no  -Cough: no -ocassional dry cough  -Shortness of breath: no - some with laying flat  -Wheezing: no -Nasal congestion: no -Runny nose: yes clear/yellow with some clots -Post nasal drip: no -Sneezing: yes -Sore throat: yes -Sinus pressure: yes -Headache: no -Face pain: no -Ear pain: no  -Ear pressure: no  -Eyes red/itching:no -Eye drainage/crusting: no  -Sick contacts: yes -Context: worse -Treatments attempted: aspirin   Review of Systems  Constitutional:  Negative for chills and fever.  HENT:  Positive for congestion and sore throat. Negative for ear pain and sinus pain.   Respiratory:  Positive for cough. Negative for sputum production, shortness of breath and wheezing.   Cardiovascular:  Negative for chest pain.  Neurological:  Negative for headaches.        Objective:    BP 118/64   Pulse (!) 108   Temp 97.9 F (36.6 C)   Resp 18   Ht 5' 6"$  (1.676 m)   Wt 134 lb 14.4 oz (61.2 kg)   BMI 21.77 kg/m    Physical Exam Constitutional:      Appearance: Normal appearance.  HENT:     Head: Normocephalic and atraumatic.     Right Ear: Tympanic membrane, ear canal and external ear normal.     Left Ear: Tympanic membrane, ear canal and external ear normal.     Nose: Nose normal.     Mouth/Throat:     Mouth: Mucous membranes are moist.     Pharynx: Posterior oropharyngeal erythema present.  Eyes:     Conjunctiva/sclera: Conjunctivae normal.  Cardiovascular:     Rate and Rhythm: Normal rate and regular rhythm.   Pulmonary:     Effort: Pulmonary effort is normal.     Breath sounds: Normal breath sounds.  Musculoskeletal:     Cervical back: No tenderness.  Lymphadenopathy:     Cervical: No cervical adenopathy.  Skin:    General: Skin is warm and dry.  Neurological:     General: No focal deficit present.     Mental Status: She is alert. Mental status is at baseline.  Psychiatric:        Mood and Affect: Mood normal.        Behavior: Behavior normal.     No results found for any visits on 01/07/23.      Assessment & Plan:   1. Viral upper respiratory tract infection/Sore throat: Retest for COVID today. Patient symptoms consistent with either viral illness or allergies. Has an allergy to Prednisone, will treat with anti-histamine and decongestant. Patient does not do well with nasal sprays but does have a humidifier. Follow up if symptoms worsen or fail to improve, discussed treating with antibiotics should symptoms last more than 10-14 days.   - Novel Coronavirus, NAA ([facility])   Return if symptoms worsen or fail to improve.  Teodora Medici, DO

## 2023-01-07 NOTE — Patient Instructions (Addendum)
It was great seeing you today!  Plan discussed at today's visit: -Recommend Allegra-D (D is pseudoephedrine) which will help with congestion/sinus pressure -Recommend Nasogel which is a nasal saline to help keep mucosa moisturizer in the nose -Continue humidifier  -COVID test today  Follow up in: as needed but let me know if symptoms worsen or fail to improve  Take care and let us know if you have any questions or concerns prior to your next visit.  Dr. Rosana Berger

## 2023-01-08 LAB — NOVEL CORONAVIRUS, NAA: SARS-CoV-2, NAA: NOT DETECTED

## 2023-02-24 ENCOUNTER — Telehealth: Payer: Self-pay | Admitting: Family Medicine

## 2023-02-24 NOTE — Telephone Encounter (Signed)
Contacted Denise Perez to schedule their annual wellness visit. Appointment made for 05/15/2023.  [facility] Care Guide [facility] AWV TEAM Direct Dial: [phone]

## 2023-03-27 DIAGNOSIS — H524 Presbyopia: Secondary | ICD-10-CM | POA: Diagnosis not present

## 2023-03-27 DIAGNOSIS — H5213 Myopia, bilateral: Secondary | ICD-10-CM | POA: Diagnosis not present

## 2023-03-27 DIAGNOSIS — H2513 Age-related nuclear cataract, bilateral: Secondary | ICD-10-CM | POA: Diagnosis not present

## 2023-03-27 DIAGNOSIS — H0288B Meibomian gland dysfunction left eye, upper and lower eyelids: Secondary | ICD-10-CM | POA: Diagnosis not present

## 2023-03-27 DIAGNOSIS — H0288A Meibomian gland dysfunction right eye, upper and lower eyelids: Secondary | ICD-10-CM | POA: Diagnosis not present

## 2023-04-17 ENCOUNTER — Encounter: Payer: Self-pay | Admitting: Family Medicine

## 2023-04-17 ENCOUNTER — Ambulatory Visit
Admission: RE | Admit: 2023-04-17 | Discharge: 2023-04-17 | Disposition: A | Discharge status: Home / Self Care | Payer: PPO | Source: Ambulatory Visit | Attending: Family Medicine | Admitting: Family Medicine

## 2023-04-17 ENCOUNTER — Ambulatory Visit
Admission: RE | Admit: 2023-04-17 | Discharge: 2023-04-17 | Disposition: A | Discharge status: Home / Self Care | Payer: PPO | Attending: Family Medicine | Admitting: Family Medicine

## 2023-04-17 ENCOUNTER — Ambulatory Visit (INDEPENDENT_AMBULATORY_CARE_PROVIDER_SITE_OTHER): Payer: PPO | Admitting: Family Medicine

## 2023-04-17 VITALS — BP 122/78 | HR 90 | Temp 97.9°F | Resp 16 | Ht 66.0 in | Wt 136.0 lb

## 2023-04-17 DIAGNOSIS — M25532 Pain in left wrist: Secondary | ICD-10-CM

## 2023-04-17 NOTE — Progress Notes (Signed)
Patient ID: Denise Perez, female    DOB: [birth year], 72 y.o.   MRN: [id number]  PCP: Alba Cory, MD  Chief Complaint  Patient presents with   Wrist Pain    Left wrist constant pain, pt fell 2 days ago    Subjective:   Denise Perez is a 72 y.o. female, presents to clinic with CC of the following:  HPI  Fall 2 d ago wed fell up concrete stairs she was carrying multiple cups and purse etc, she full up the last 2 steps onto the concrete stoop she immediately had left ankle abrasion and left knee bruise  She developed pain to her left wrist over the past 2 days, it hurts to the ulnar aspect and this is a similar place that she feels pain when she does certain bicep lifting and exercises, she does not have any bruising swelling or decreased range of motion She has hx of osteoporosis and multiple fractures    Patient Active Problem List   Diagnosis Date Noted   Vitamin D deficiency 05/09/2022   COVID-19 long hauler 05/09/2022   Atherosclerosis of aorta (HCC) 05/07/2020   FH: colon cancer 05/30/2019   Dyslipidemia 05/28/2016   Osteoporosis 05/28/2016   Cervical disc disease 05/28/2016   Primary osteoarthritis involving multiple joints 06/11/2015   Synovial cyst of wrist 06/11/2015      Current Outpatient Medications:    benzonatate (TESSALON) 100 MG capsule, Take 1 capsule (100 mg total) by mouth every 8 (eight) hours., Disp: 30 capsule, Rfl: 0   Cholecalciferol (VITAMIN D) 2000 units CAPS, Take 1 capsule (2,000 Units total) by mouth daily., Disp: 30 capsule, Rfl: 0   Omega-3 Fatty Acids (FISH OIL) 1000 MG CAPS, Take 1 capsule by mouth daily., Disp: , Rfl:    promethazine-dextromethorphan (PROMETHAZINE-DM) 6.25-15 MG/5ML syrup, Take 5 mLs by mouth 4 (four) times daily as needed for cough., Disp: 118 mL, Rfl: 0   Turmeric 400 MG CAPS, Take 1 capsule by mouth daily., Disp: , Rfl:    Allergies  Allergen Reactions   Prednisone Other (See Comments)    Shaking      Social History   Tobacco Use   Smoking status: Former    Packs/day: 1.50    Years: 15.00    Additional pack years: 0.00    Total pack years: 22.50    Types: Cigarettes    Start date: 11/17/1970    Quit date: 11/17/1985    Years since quitting: 37.4   Smokeless tobacco: Never   Tobacco comments:    smoking cessation materials not required  Vaping Use   Vaping Use: Never used  Substance Use Topics   Alcohol use: Not Currently    Alcohol/week: 0.0 standard drinks of alcohol    Comment: 5 glasses of wine a year   Drug use: No      Chart Review Today: I personally reviewed active problem list, medication list, allergies, family history, social history, health maintenance, notes from last encounter, lab results, imaging with the patient/caregiver today.   Review of Systems  Constitutional: Negative.   HENT: Negative.    Eyes: Negative.   Respiratory: Negative.    Cardiovascular: Negative.   Gastrointestinal: Negative.   Endocrine: Negative.   Genitourinary: Negative.   Musculoskeletal: Negative.   Skin: Negative.   Allergic/Immunologic: Negative.   Neurological: Negative.   Hematological: Negative.   Psychiatric/Behavioral: Negative.    All other systems reviewed and are negative.  Objective:   Vitals:   04/17/23 1139 04/17/23 1150  BP: (!) 154/92 122/78  Pulse: 90   Resp: 16   Temp: 97.9 F (36.6 C)   TempSrc: Oral   SpO2: 97%   Weight: 136 lb (61.7 kg)   Height: 5\' 6"  (1.676 m)     Body mass index is 21.95 kg/m.  Physical Exam Vitals and nursing note reviewed.  Constitutional:      General: She is not in acute distress.    Appearance: Normal appearance. She is well-developed. She is not ill-appearing, toxic-appearing or diaphoretic.  HENT:     Head: Normocephalic and atraumatic.     Nose: Nose normal.  Eyes:     General:        Right eye: No discharge.        Left eye: No discharge.     Conjunctiva/sclera: Conjunctivae normal.  Neck:      Trachea: No tracheal deviation.  Cardiovascular:     Rate and Rhythm: Normal rate and regular rhythm.  Pulmonary:     Effort: Pulmonary effort is normal. No respiratory distress.     Breath sounds: No stridor.  Musculoskeletal:        General: Normal range of motion.     Left wrist: No swelling, deformity, tenderness, bony tenderness or snuff box tenderness. Normal range of motion.  Skin:    General: Skin is warm and dry.     Findings: No rash.  Neurological:     Mental Status: She is alert.     Motor: No abnormal muscle tone.     Coordination: Coordination normal.  Psychiatric:        Behavior: Behavior normal.      Results for orders placed or performed in visit on 01/07/23  Novel Coronavirus, NAA ([facility])   Specimen: Nasopharyngeal(NP) swabs in vial transport medium   Nasopharynge  Previous  Result Value Ref Range   SARS-CoV-2, NAA Not Detected Not Detected       Assessment & Plan:     ICD-10-CM   1. Acute pain of left wrist  M25.532 DG Wrist Complete Left    Status post fall onset of pain was delayed over the past 2 days With multiple fractures and osteoporosis we will obtain a x-ray today patient can continue to use her brace if it helps her pain, if it exacerbates her pain she was encouraged to just be careful. Follow-up pending x-ray results Suspect she may have a slight strain or sprain I did explain to the patient that there has been delayed radiology reports with her x-ray imaging and her x-ray today will be ordered routine I see no need for a stat order and I will review the imaging and send her message later today  Otherwise conservative management and follow-up as needed      Danelle Berry, PA-C 04/17/23 11:50 AM

## 2023-05-13 NOTE — Progress Notes (Signed)
Name: Denise Perez   MRN: [id number]    DOB: June 28, 1951   Date:05/14/2023       Progress Note  Subjective  Chief Complaint  Follow Up  HPI  [place] COVID-19: she was diagnosed in May 2022 and took Paxlovid, her symptoms were very mild, however she states since COVID-19 she has noticed decrease in exercise tolerance, she had some chest pain last year, saw cardiologist had negative cardiac calcium score, SOB has improved but still has intermittent symptoms, she states she was feeling much better even through she had COVID again around Christmas , symptoms were mild in Dec.   Age related osteoporosis: reviewed bone density done 07/22 and showed again mild progression of osteoporosis of left femur .She is very worried about osteonecrosis of the jaw. We will recheck bone density and if progression she agrees to see Endocrinologist    Dyslipidemia: she is only on life style modification and otc fish oil , since last visit she had CT abdomen pelvis that showed atherosclerosis of aorta.  We will recheck level today    Hair loss: she noticed thinning on top of her scalp over the past several years, she was seen by Dr. Adolphus Birchwood, she was given Minoxidil but she never started due to possible side effects . Unchanged  Rash on forearms: started a couple of years ago, only when exposed to the sun, she does not use sunscreen, it does not itch  History of tobacco use: she smoke one pack per day for about 15 years but quit in 1987, CT calcium score showed incidental findings of lung nodule 4 mm , read the result to patient and we will not recheck CT . Unchanged    Recent fall: she tripped going up her stairs while carrying two glasses - one on each hand. Discussed fall prevention . She is physically active   Patient Active Problem List   Diagnosis Date Noted   Vitamin D deficiency 05/09/2022   COVID-19 long hauler 05/09/2022   Atherosclerosis of aorta (HCC) 05/07/2020   FH: colon cancer 05/30/2019    Dyslipidemia 05/28/2016   Osteoporosis 05/28/2016   Cervical disc disease 05/28/2016   Primary osteoarthritis involving multiple joints 06/11/2015   Synovial cyst of wrist 06/11/2015    Past Surgical History:  Procedure Laterality Date   COLONOSCOPY WITH PROPOFOL N/A 08/13/2015   Procedure: COLONOSCOPY WITH PROPOFOL;  Surgeon: Wallace Cullens, MD;  Location: [facility] ENDOSCOPY;  Service: Gastroenterology;  Laterality: N/A;   COLONOSCOPY WITH PROPOFOL N/A 06/29/2019   Procedure: COLONOSCOPY WITH PROPOFOL;  Surgeon: Toledo, Boykin Nearing, MD;  Location: [facility] ENDOSCOPY;  Service: Gastroenterology;  Laterality: N/A;   ESOPHAGOGASTRODUODENOSCOPY (EGD) WITH PROPOFOL     ESOPHAGOGASTRODUODENOSCOPY (EGD) WITH PROPOFOL N/A 06/29/2019   Procedure: ESOPHAGOGASTRODUODENOSCOPY (EGD) WITH PROPOFOL;  Surgeon: Toledo, Boykin Nearing, MD;  Location: [facility] ENDOSCOPY;  Service: Gastroenterology;  Laterality: N/A;   GUM SURGERY     TOOTH EXTRACTION      Family History  Problem Relation Age of Onset   Cancer Father        Colon   Breast cancer Neg Hx     Social History   Tobacco Use   Smoking status: Former    Packs/day: 1.50    Years: 15.00    Additional pack years: 0.00    Total pack years: 22.50    Types: Cigarettes    Start date: 11/17/1970    Quit date: 11/17/1985    Years since quitting: 37.5  Smokeless tobacco: Never   Tobacco comments:    smoking cessation materials not required  Substance Use Topics   Alcohol use: Not Currently    Alcohol/week: 0.0 standard drinks of alcohol    Comment: 5 glasses of wine a year     Current Outpatient Medications:    Cholecalciferol (VITAMIN D) 2000 units CAPS, Take 1 capsule (2,000 Units total) by mouth daily., Disp: 30 capsule, Rfl: 0   Omega-3 Fatty Acids (FISH OIL) 1000 MG CAPS, Take 1 capsule by mouth daily., Disp: , Rfl:    Turmeric 400 MG CAPS, Take 1 capsule by mouth daily., Disp: , Rfl:   Allergies  Allergen Reactions   Prednisone Other (See Comments)     Shaking    I personally reviewed active problem list, medication list, allergies, family history, social history, health maintenance with the patient/caregiver today.   ROS  Constitutional: Negative for fever or weight change.  Respiratory: Negative for cough and shortness of breath.   Cardiovascular: Negative for chest pain or palpitations.  Gastrointestinal: Negative for abdominal pain, no bowel changes.  Musculoskeletal: Negative for gait problem or joint swelling.  Skin: positive for rash.  Neurological: Negative for dizziness or headache.  No other specific complaints in a complete review of systems (except as listed in HPI above).   Objective  Vitals:   05/14/23 0927  BP: 132/70  Pulse: 90  Resp: 16  SpO2: 96%  Weight: 135 lb (61.2 kg)  Height: 5\' 6"  (1.676 m)    Body mass index is 21.79 kg/m.  Physical Exam  Constitutional: Patient appears well-developed and well-nourished. No distress.  HEENT: head atraumatic, normocephalic, pupils equal and reactive to light, neck supple Cardiovascular: Normal rate, regular rhythm and normal heart sounds.  No murmur heard. No BLE edema. Pulmonary/Chest: Effort normal and breath sounds normal. No respiratory distress. Abdominal: Soft.  There is no tenderness. Psychiatric: Patient has a normal mood and affect. behavior is normal. Judgment and thought content normal.   PHQ2/9:    05/14/2023    9:26 AM 04/17/2023   11:40 AM 01/07/2023    9:51 AM 11/04/2022    3:01 PM 05/09/2022    7:56 AM  Depression screen PHQ 2/9  Decreased Interest 0 0 0 0 0  Down, Depressed, Hopeless 0 0 0 0 0  PHQ - 2 Score 0 0 0 0 0  Altered sleeping 0 0 0 0   Tired, decreased energy 0 0 0 0   Change in appetite 0 0 0 0   Feeling bad or failure about yourself  0 0 0 0   Trouble concentrating 0 0 0 0   Moving slowly or fidgety/restless 0 0 0 0   Suicidal thoughts 0 0 0 0   PHQ-9 Score 0 0 0 0   Difficult doing work/chores  Not difficult at all Not  difficult at all Not difficult at all     phq 9 is negative   Fall Risk:    05/14/2023    9:25 AM 04/17/2023   11:34 AM 01/07/2023    9:51 AM 11/04/2022    3:01 PM 05/09/2022    7:56 AM  Fall Risk   Falls in the past year? 1 1 0 0 0  Number falls in past yr: 0 0 0 0 0  Injury with Fall? 1 0 0 0 0  Risk for fall due to : No Fall Risks Impaired balance/gait  No Fall Risks No Fall Risks  Follow up Falls  prevention discussed Falls prevention discussed;Education provided;Falls evaluation completed  Falls prevention discussed;Education provided;Falls evaluation completed Falls prevention discussed      Functional Status Survey: Is the patient deaf or have difficulty hearing?: No Does the patient have difficulty seeing, even when wearing glasses/contacts?: No Does the patient have difficulty concentrating, remembering, or making decisions?: No Does the patient have difficulty walking or climbing stairs?: No Does the patient have difficulty dressing or bathing?: No Does the patient have difficulty doing errands alone such as visiting a doctor's office or shopping?: No    Assessment & Plan  1. Atherosclerosis of aorta (HCC)  - Lipid panel  2. Vitamin D deficiency  Continue supplementation   3. Age-related osteoporosis without current pathological fracture  - DG Bone Density; Future  4. Dyslipidemia  - Lipid panel  5. Long-term use of high-risk medication  - CBC with Differential/Platelet - COMPLETE METABOLIC PANEL WITH GFR  6. Breast cancer screening by mammogram  - MM 3D SCREENING MAMMOGRAM BILATERAL BREAST; Future  7. History of recent fall

## 2023-05-14 ENCOUNTER — Ambulatory Visit (INDEPENDENT_AMBULATORY_CARE_PROVIDER_SITE_OTHER): Payer: PPO | Admitting: Family Medicine

## 2023-05-14 ENCOUNTER — Encounter: Payer: Self-pay | Admitting: Family Medicine

## 2023-05-14 VITALS — BP 132/70 | HR 90 | Resp 16 | Ht 66.0 in | Wt 135.0 lb

## 2023-05-14 DIAGNOSIS — Z79899 Other long term (current) drug therapy: Secondary | ICD-10-CM | POA: Diagnosis not present

## 2023-05-14 DIAGNOSIS — M810 Age-related osteoporosis without current pathological fracture: Secondary | ICD-10-CM

## 2023-05-14 DIAGNOSIS — E785 Hyperlipidemia, unspecified: Secondary | ICD-10-CM

## 2023-05-14 DIAGNOSIS — I700 Atherosclerosis of aorta: Secondary | ICD-10-CM | POA: Diagnosis not present

## 2023-05-14 DIAGNOSIS — E559 Vitamin D deficiency, unspecified: Secondary | ICD-10-CM

## 2023-05-14 DIAGNOSIS — Z1231 Encounter for screening mammogram for malignant neoplasm of breast: Secondary | ICD-10-CM | POA: Diagnosis not present

## 2023-05-14 DIAGNOSIS — Z9181 History of falling: Secondary | ICD-10-CM

## 2023-05-14 NOTE — Progress Notes (Signed)
Patient: Denise Perez, Female    DOB: 01/17/1951, 72 y.o.   MRN: [id number]  Visit Date: 05/15/2023  Today's Provider: Ruel Favors, MD   Welcome to Medicare  Subjective:    HPI Denise Perez is a 72 y.o. female who presents today for her Subsequent Annual Wellness Visit.  Patient/Caregiver input:  reviewed labs , LDL improved, we will not start statins at this time   Review of Systems  Constitutional: Negative for fever or weight change.  Respiratory: Negative for cough and shortness of breath.   Cardiovascular: Negative for chest pain or palpitations.  Gastrointestinal: Negative for abdominal pain, no bowel changes.  Musculoskeletal: Negative for gait problem or joint swelling.  Skin: she states gets red on her forearms when out in the sun Neurological: Negative for dizziness or headache.  No other specific complaints in a complete review of systems (except as listed in HPI above).  Past Medical History:  Diagnosis Date   Cervical disc disease    Cervical neuropathy    GERD (gastroesophageal reflux disease)    Osteoporosis    Tietze's disease     Past Surgical History:  Procedure Laterality Date   COLONOSCOPY WITH PROPOFOL N/A 08/13/2015   Procedure: COLONOSCOPY WITH PROPOFOL;  Surgeon: Wallace Cullens, MD;  Location: [facility] END[facility]COPY;  Service: Gastroenterology;  Laterality: N/A;   COLONOSCOPY WITH PROPOFOL N/A 06/29/2019   Procedure: COLONOSCOPY WITH PROPOFOL;  Surgeon: Toledo, Boykin Nearing, MD;  Location: [facility] ENDOSCOPY;  Service: Gastroenterology;  Laterality: N/A;   ESOPHAGOGASTRODUODENOSCOPY (EGD) WITH PROPOFOL     ESOPHAGOGASTRODUODENOSCOPY (EGD) WITH PROPOFOL N/A 06/29/2019   Procedure: ESOPHAGOGASTRODUODENOSCOPY (EGD) WITH PROPOFOL;  Surgeon: Toledo, Boykin Nearing, MD;  Location: [facility] ENDOSCOPY;  Service: Gastroenterology;  Laterality: N/A;   GUM SURGERY     TOOTH EXTRACTION      Family History  Problem Relation Age of Onset   Cancer Father        Colon    Breast cancer Neg Hx     Social History   Socioeconomic History   Marital status: Married    Spouse name: Baldo Ash   Number of children: 0   Years of education: Not on file   Highest education level: Bachelor's degree (e.g., BA, AB, BS)  Occupational History   Occupation: Retired  Tobacco Use   Smoking status: Former    Packs/day: 1.50    Years: 15.00    Additional pack years: 0.00    Total pack years: 22.50    Types: Cigarettes    Start date: 11/17/1970    Quit date: 11/17/1985    Years since quitting: 37.5   Smokeless tobacco: Never   Tobacco comments:    smoking cessation materials not required  Vaping Use   Vaping Use: Never used  Substance and Sexual Activity   Alcohol use: Not Currently    Alcohol/week: 0.0 standard drinks of alcohol    Comment: 5 glasses of wine a year   Drug use: No   Sexual activity: Not Currently    Partners: Male  Other Topics Concern   Not on file  Social History Narrative   Not on file   Social Determinants of Health   Financial Resource Strain: Low Risk  (05/15/2023)   Overall Financial Resource Strain (CARDIA)    Difficulty of Paying Living Expenses: Not hard at all  Food Insecurity: No Food Insecurity (05/15/2023)   Hunger Vital Sign    Worried About Running Out of Food in the Last  Year: Never true    Ran Out of Food in the Last Year: Never true  Transportation Needs: No Transportation Needs (05/15/2023)   PRAPARE - Administrator, Civil Service (Medical): No    Lack of Transportation (Non-Medical): No  Physical Activity: Sufficiently Active (05/15/2023)   Exercise Vital Sign    Days of Exercise per Week: 4 days    Minutes of Exercise per Session: 50 min  Stress: No Stress Concern Present (05/15/2023)   [employer] of Occupational Health - Occupational Stress Questionnaire    Feeling of Stress : Not at all  Social Connections: Socially Integrated (05/15/2023)   Social Connection and Isolation Panel [NHANES]    Frequency  of Communication with Friends and Family: More than three times a week    Frequency of Social Gatherings with Friends and Family: More than three times a week    Attends Religious Services: More than 4 times per year    Active Member of [employer] or Organizations: Yes    Attends Engineer, structural: More than 4 times per year    Marital Status: Married  Catering manager Violence: Not At Risk (05/15/2023)   Humiliation, Afraid, Rape, and Kick questionnaire    Fear of Current or Ex-Partner: No    Emotionally Abused: No    Physically Abused: No    Sexually Abused: No    Outpatient Encounter Medications as of 05/15/2023  Medication Sig   Cholecalciferol (VITAMIN D) 2000 units CAPS Take 1 capsule (2,000 Units total) by mouth daily.   Omega-3 Fatty Acids (FISH OIL) 1000 MG CAPS Take 1 capsule by mouth daily.   Turmeric 400 MG CAPS Take 1 capsule by mouth daily.   [DISCONTINUED] benzonatate (TESSALON) 100 MG capsule Take 1 capsule (100 mg total) by mouth every 8 (eight) hours.   [DISCONTINUED] promethazine-dextromethorphan (PROMETHAZINE-DM) 6.25-15 MG/5ML syrup Take 5 mLs by mouth 4 (four) times daily as needed for cough.   No facility-administered encounter medications on file as of 05/15/2023.    Allergies  Allergen Reactions   Prednisone Other (See Comments)    Shaking    Care Team Updated in EHR: Yes  Last Vision Exam: April 2024 Wears corrective lenses: Yes Last Dental Exam: June 2024 Last Hearing Exam: Unsure Wears Hearing Aids: No  Functional Ability / Safety Screening 1.  Was the timed Get Up and Go test shorter than 30 seconds?  yes 2.  Does the patient need help with the phone, transportation, shopping,      preparing meals, housework, laundry, medications, or managing money?  no 3.  Is the patient's home free of loose throw rugs in walkways, pet beds, electrical cords, etc?   yes      Grab bars in the bathroom? yes      Handrails on the stairs?   yes      Adequate  lighting?   yes 4.  Has the patient noticed any hearing difficulties?   no  Diet Recall and Exercise Regimen: Balanced, going to the gym , walks and takes water aerobics, also goes to a chiropractor   Advanced Care Planning: A voluntary discussion about advance care planning including the explanation and discussion of advance directives.  Discussed health care proxy and Living will, and the patient was able to identify a health care proxy as husband.  Patient does have a living will at present time. If patient does have living will, I have requested they bring this to the clinic to be  scanned in to their chart. Does patient have a HCPOA?    yes If yes, name and contact information: husband  Does patient have a living will or MOST form?  yes  Cancer Screenings: Skin: rash on forearms, she sees dermatologist  Lung: Low Dose CT Chest recommended if Age 16-80 years, 30 pack-year currently smoking OR have quit w/in 15years. Patient does not qualify.  Breast: Up to date on Mammogram? Yes  Up to date of Bone Density/Dexa? Yes Colon: Colonoscopy 06/29/19  Additional Screenings: Hepatitis B/HIV/Syphillis: N/A Hepatitis C Screening: 05/28/16 Intimate Partner Violence: Negative  Objective:   Vitals: BP 120/72   Pulse 63   Resp 16   Ht 5\' 6"  (1.676 m)   Wt 135 lb (61.2 kg)   SpO2 98%   BMI 21.79 kg/m  Body mass index is 21.79 kg/m.  No results found.  Physical Exam Constitutional: Patient appears well-developed and well-nourished.  No distress.  HEENT: head atraumatic, normocephalic, pupils equal and reactive to light, neck supple Cardiovascular: Normal rate, regular rhythm and normal heart sounds.  No murmur heard. No BLE edema. Pulmonary/Chest: Effort normal and breath sounds normal. No respiratory distress. Abdominal: Soft.  There is no tenderness. Psychiatric: Patient has a normal mood and affect. behavior is normal. Judgment and thought content normal.  Cognitive Testing - 6-CIT   Correct? Score   What year is it? yes 0 Yes = 0    No = 4  What month is it? yes 0 Yes = 0    No = 3  Remember:     Floyde Parkins, [address], [place]     What time is it? yes 0 Yes = 0    No = 3  Count backwards from 20 to 1 yes 0 Correct = 0    1 error = 2   More than 1 error = 4  Say the months of the year in reverse. yes 0 Correct = 0    1 error = 2   More than 1 error = 4  What address did I ask you to remember? yes 0 Correct = 0  1 error = 2    2 error = 4    3 error = 6    4 error = 8    All wrong = 10       TOTAL SCORE  0/28   Interpretation:  Normal  Normal (0-7) Abnormal (8-28)   Fall Risk:    05/15/2023    7:52 AM 05/14/2023    9:25 AM 04/17/2023   11:34 AM 01/07/2023    9:51 AM 11/04/2022    3:01 PM  Fall Risk   Falls in the past year? 1 1 1  0 0  Number falls in past yr: 0 0 0 0 0  Injury with Fall? 1 1 0 0 0  Risk for fall due to : No Fall Risks No Fall Risks Impaired balance/gait  No Fall Risks  Follow up Falls prevention discussed Falls prevention discussed Falls prevention discussed;Education provided;Falls evaluation completed  Falls prevention discussed;Education provided;Falls evaluation completed    Depression Screen    05/15/2023    7:52 AM 05/14/2023    9:26 AM 04/17/2023   11:40 AM 01/07/2023    9:51 AM 11/04/2022    3:01 PM  Depression screen PHQ 2/9  Decreased Interest 0 0 0 0 0  Down, Depressed, Hopeless 0 0 0 0 0  PHQ - 2 Score 0 0 0 0  0  Altered sleeping 0 0 0 0 0  Tired, decreased energy 0 0 0 0 0  Change in appetite 0 0 0 0 0  Feeling bad or failure about yourself  0 0 0 0 0  Trouble concentrating 0 0 0 0 0  Moving slowly or fidgety/restless 0 0 0 0 0  Suicidal thoughts 0 0 0 0 0  PHQ-9 Score 0 0 0 0 0  Difficult doing work/chores   Not difficult at all Not difficult at all Not difficult at all    Recent Results (from the past 2160 hour(s))  Lipid panel     Status: Abnormal   Collection Time: 05/14/23 10:08 AM  Result Value Ref Range    Cholesterol 232 (H) <200 mg/dL   HDL 86 > OR = 50 mg/dL   Triglycerides 58 <244 mg/dL   LDL Cholesterol (Calc) 132 (H) mg/dL (calc)    Comment: Reference range: <100 . Desirable range <100 mg/dL for primary prevention;   <70 mg/dL for patients with CHD or diabetic patients  with > or = 2 CHD risk factors. Marland Kitchen LDL-C is now calculated using the Martin-Hopkins  calculation, which is a validated novel method providing  better accuracy than the Friedewald equation in the  estimation of LDL-C.  Horald Pollen et al. Lenox Ahr. 0102;725(36): 2061-2068  (http://education.[facility].com/faq/FAQ164)    Total CHOL/HDL Ratio 2.7 <5.0 (calc)   Non-HDL Cholesterol (Calc) 146 (H) <130 mg/dL (calc)    Comment: For patients with diabetes plus 1 major ASCVD risk  factor, treating to a non-HDL-C goal of <100 mg/dL  (LDL-C of <64 mg/dL) is considered a therapeutic  option.   CBC with Differential/Platelet     Status: None   Collection Time: 05/14/23 10:08 AM  Result Value Ref Range   WBC 5.9 3.8 - 10.8 Thousand/uL   RBC 4.36 3.80 - 5.10 Million/uL   Hemoglobin 13.7 11.7 - 15.5 g/dL   HCT 40.3 47.4 - 25.9 %   MCV 92.4 80.0 - 100.0 fL   MCH 31.4 27.0 - 33.0 pg   MCHC 34.0 32.0 - 36.0 g/dL   RDW 56.3 87.5 - 64.3 %   Platelets 214 140 - 400 Thousand/uL   MPV 9.9 7.5 - 12.5 fL   Neutro Abs 3,528 1,500 - 7,800 cells/uL   Lymphs Abs 1,752 850 - 3,900 cells/uL   Absolute Monocytes 478 200 - 950 cells/uL   Eosinophils Absolute 100 15 - 500 cells/uL   Basophils Absolute 41 0 - 200 cells/uL   Neutrophils Relative % 59.8 %   Total Lymphocyte 29.7 %   Monocytes Relative 8.1 %   Eosinophils Relative 1.7 %   Basophils Relative 0.7 %  COMPLETE METABOLIC PANEL WITH GFR     Status: None   Collection Time: 05/14/23 10:08 AM  Result Value Ref Range   Glucose, Bld 83 65 - 99 mg/dL    Comment: .            Fasting reference interval .    BUN 16 7 - 25 mg/dL   Creat 3.29 5.18 - 8.41 mg/dL   eGFR 64 > OR = 60  YS/AYT/0.16W1   BUN/Creatinine Ratio SEE NOTE: 6 - 22 (calc)    Comment:    Not Reported: BUN and Creatinine are within    reference range. .    Sodium 140 135 - 146 mmol/L   Potassium 4.5 3.5 - 5.3 mmol/L   Chloride 105 98 - 110 mmol/L   CO2  25 20 - 32 mmol/L   Calcium 9.2 8.6 - 10.4 mg/dL   Total Protein 6.2 6.1 - 8.1 g/dL   Albumin 4.0 3.6 - 5.1 g/dL   Globulin 2.2 1.9 - 3.7 g/dL (calc)   AG Ratio 1.8 1.0 - 2.5 (calc)   Total Bilirubin 0.5 0.2 - 1.2 mg/dL   Alkaline phosphatase (APISO) 81 37 - 153 U/L   AST 18 10 - 35 U/L   ALT 13 6 - 29 U/L    Assessment & Plan:    1. Welcome to Medicare preventive visit   Exercise Activities and Dietary recommendations  Goals      DIET - INCREASE WATER INTAKE     Recommend to drink at least 6-8 8oz glasses of water per day.     Patient Stated     Working to get weight back to the 130's lbs     Patient Stated     She would like to drink more water and increase her physical activity to include exercising at home.  She would like to incorporate strength training into her exercise routine.        - Discussed health benefits of physical activity, and encouraged her to engage in regular exercise appropriate for her age and condition.   Immunization History  Administered Date(s) Administered   Influenza, High Dose Seasonal PF 08/14/2017, 08/24/2018, 08/03/2020, 08/31/2022   Influenza-Unspecified 08/08/2014, 08/17/2017, 08/24/2018, 12/07/2021   Moderna SARS-COV2 Booster Vaccination 10/15/2020   Moderna Sars-Covid-2 Vaccination 12/12/2019, 01/09/2020   Pneumococcal Conjugate-13 04/22/2018   Pneumococcal Polysaccharide-23 05/28/2016   Tdap 03/08/2013   Zoster Recombinat (Shingrix) 06/28/2018, 11/12/2018   Zoster, Live 03/08/2013    Health Maintenance  Topic Date Due   COVID-19 Vaccine (3 - Moderna risk series) 11/12/2020   DTaP/Tdap/Td (2 - Td or Tdap) 03/09/2023   INFLUENZA VACCINE  06/18/2023   MAMMOGRAM  06/27/2023    Medicare Annual Wellness (AWV)  05/14/2024   Colonoscopy  06/28/2024   Pneumonia Vaccine 38+ Years old  Completed   DEXA SCAN  Completed   Hepatitis C Screening  Completed   Zoster Vaccines- Shingrix  Completed   HPV VACCINES  Aged Out    No orders of the defined types were placed in this encounter.   Current Outpatient Medications:    Cholecalciferol (VITAMIN D) 2000 units CAPS, Take 1 capsule (2,000 Units total) by mouth daily., Disp: 30 capsule, Rfl: 0   Omega-3 Fatty Acids (FISH OIL) 1000 MG CAPS, Take 1 capsule by mouth daily., Disp: , Rfl:    Turmeric 400 MG CAPS, Take 1 capsule by mouth daily., Disp: , Rfl:  There are no discontinued medications.  I have personally reviewed and addressed the Medicare Annual Wellness health risk assessment questionnaire and have noted the following in the patient's chart:  A.         Medical and social history & family history B.         Use of alcohol, tobacco, and illicit drugs  C.         Current medications and supplements D.         Functional and Cognitive ability and status E.         Nutritional status F.         Physical activity G.        Advance directives H.         List of other physicians I.          Hospitalizations, surgeries,  and ER visits in previous 12 months J.         Vitals K.         Screenings such as hearing, vision, cognitive function, and depression L.         Referrals and appointments:   In addition, I have reviewed and discussed with patient certain preventive protocols, quality metrics, and best practice recommendations. A written personalized care plan for preventive services as well as general preventive health recommendations were provided to patient.   See attached scanned questionnaire for additional information.

## 2023-05-14 NOTE — Patient Instructions (Addendum)
Tdap  RSV PCV 20

## 2023-05-15 ENCOUNTER — Ambulatory Visit (INDEPENDENT_AMBULATORY_CARE_PROVIDER_SITE_OTHER): Payer: PPO | Admitting: Family Medicine

## 2023-05-15 ENCOUNTER — Encounter: Payer: Self-pay | Admitting: Family Medicine

## 2023-05-15 VITALS — BP 120/72 | HR 63 | Resp 16 | Ht 66.0 in | Wt 135.0 lb

## 2023-05-15 DIAGNOSIS — Z0000 Encounter for general adult medical examination without abnormal findings: Secondary | ICD-10-CM

## 2023-05-15 LAB — COMPLETE METABOLIC PANEL WITH GFR
AG Ratio: 1.8 (calc) (ref 1.0–2.5)
ALT: 13 U/L (ref 6–29)
AST: 18 U/L (ref 10–35)
Albumin: 4 g/dL (ref 3.6–5.1)
Alkaline phosphatase (APISO): 81 U/L (ref 37–153)
BUN: 16 mg/dL (ref 7–25)
CO2: 25 mmol/L (ref 20–32)
Calcium: 9.2 mg/dL (ref 8.6–10.4)
Chloride: 105 mmol/L (ref 98–110)
Creat: 0.94 mg/dL (ref 0.60–1.00)
Globulin: 2.2 g/dL (calc) (ref 1.9–3.7)
Glucose, Bld: 83 mg/dL (ref 65–99)
Potassium: 4.5 mmol/L (ref 3.5–5.3)
Sodium: 140 mmol/L (ref 135–146)
Total Bilirubin: 0.5 mg/dL (ref 0.2–1.2)
Total Protein: 6.2 g/dL (ref 6.1–8.1)
eGFR: 64 mL/min/{1.73_m2} (ref >=60)

## 2023-05-15 LAB — CBC WITH DIFFERENTIAL/PLATELET
Absolute Monocytes: 478 cells/uL (ref 200–950)
Basophils Absolute: 41 cells/uL (ref 0–200)
Basophils Relative: 0.7 %
Eosinophils Absolute: 100 cells/uL (ref 15–500)
Eosinophils Relative: 1.7 %
HCT: 40.3 % (ref 35.0–45.0)
Hemoglobin: 13.7 g/dL (ref 11.7–15.5)
Lymphs Abs: 1752 cells/uL (ref 850–3900)
MCH: 31.4 pg (ref 27.0–33.0)
MCHC: 34 g/dL (ref 32.0–36.0)
MCV: 92.4 fL (ref 80.0–100.0)
MPV: 9.9 fL (ref 7.5–12.5)
Monocytes Relative: 8.1 %
Neutro Abs: 3528 cells/uL (ref 1500–7800)
Neutrophils Relative %: 59.8 %
Platelets: 214 10*3/uL (ref 140–400)
RBC: 4.36 10*6/uL (ref 3.80–5.10)
RDW: 13.5 % (ref 11.0–15.0)
Total Lymphocyte: 29.7 %
WBC: 5.9 10*3/uL (ref 3.8–10.8)

## 2023-05-15 LAB — LIPID PANEL
Cholesterol: 232 mg/dL — ABNORMAL HIGH (ref <200)
HDL: 86 mg/dL (ref >=50)
LDL Cholesterol (Calc): 132 mg/dL (calc) — ABNORMAL HIGH
Non-HDL Cholesterol (Calc): 146 mg/dL (calc) — ABNORMAL HIGH (ref <130)
Total CHOL/HDL Ratio: 2.7 (calc) (ref <5.0)
Triglycerides: 58 mg/dL (ref <150)

## 2023-06-15 ENCOUNTER — Telehealth: Payer: Self-pay

## 2023-06-15 ENCOUNTER — Ambulatory Visit: Payer: Self-pay | Admitting: *Deleted

## 2023-06-15 NOTE — Telephone Encounter (Signed)
Summary: digestive discomfort and concern   The patient has experienced loose watery stool for roughly 5/6 days  The patient shares that they have experienced no cramping, nausea or vomiting  The patient would like to be contacted to discuss otc options with a member of clinical staff  Please contact when possible           Chief Complaint: loose watery stools Symptoms: loose watery stools approx 4 a day. Reports dark in color once or twice but mostly brown in color Frequency: 5-6 days  Pertinent Negatives: Patient denies fever, no abdominal pain, no N/V. No incontinence. No signs of dehydration Disposition: [] ED /[] Urgent Care (no appt availability in office) / [] Appointment(In office/virtual)/ []  [facility]/ [] Home Care/ [x] Refused Recommended Disposition /[] [facility]/ []  Follow-up with PCP Additional Notes:   Offered appt for tomorrow. Patient reports she wants to try Pepto bismol to see if medication will help with sx. Will call back if no relief from OTC medication . Please advise        Reason for Disposition  [1] MODERATE diarrhea (e.g., 4-6 times / day more than normal) AND [2] present > 48 hours (2 days)  Answer Assessment - Initial Assessment Questions 1. DIARRHEA SEVERITY: "How bad is the diarrhea?" "How many more stools have you had in the past 24 hours than normal?"    - NO DIARRHEA (SCALE 0)   - MILD (SCALE 1-3): Few loose or mushy BMs; increase of 1-3 stools over normal daily number of stools; mild increase in ostomy output.   -  MODERATE (SCALE 4-7): Increase of 4-6 stools daily over normal; moderate increase in ostomy output.   -  SEVERE (SCALE 8-10; OR "WORST POSSIBLE"): Increase of 7 or more stools daily over normal; moderate increase in ostomy output; incontinence.     4 a day sometimes more 2. ONSET: "When did the diarrhea begin?"      5-6 days ago  3. BM CONSISTENCY: "How loose or watery is the diarrhea?"      Loose watery 4.  VOMITING: "Are you also vomiting?" If Yes, ask: "How many times in the past 24 hours?"      No 5. ABDOMEN PAIN: "Are you having any abdomen pain?" If Yes, ask: "What does it feel like?" (e.g., crampy, dull, intermittent, constant)      Denies  6. ABDOMEN PAIN SEVERITY: If present, ask: "How bad is the pain?"  (e.g., Scale 1-10; mild, moderate, or severe)   - MILD (1-3): doesn't interfere with normal activities, abdomen soft and not tender to touch    - MODERATE (4-7): interferes with normal activities or awakens from sleep, abdomen tender to touch    - SEVERE (8-10): excruciating pain, doubled over, unable to do any normal activities       No 7. ORAL INTAKE: If vomiting, "Have you been able to drink liquids?" "How much liquids have you had in the past 24 hours?"     no 8. HYDRATION: "Any signs of dehydration?" (e.g., dry mouth [not just dry lips], too weak to stand, dizziness, new weight loss) "When did you last urinate?"     No  9. EXPOSURE: "Have you traveled to a foreign country recently?" "Have you been exposed to anyone with diarrhea?" "Could you have eaten any food that was spoiled?"     na 10. ANTIBIOTIC USE: "Are you taking antibiotics now or have you taken antibiotics in the past 2 months?"  no 11. OTHER SYMPTOMS: "Do you have any other symptoms?" (e.g., fever, blood in stool)       Dark stools once or twice but usually brown. 12. PREGNANCY: "Is there any chance you are pregnant?" "When was your last menstrual period?"       na  Protocols used: [facility]

## 2023-06-15 NOTE — Telephone Encounter (Signed)
Patient stated she is having relief with Pepto Bismol at this time. She will contact us if her symptoms return.

## 2023-06-15 NOTE — Telephone Encounter (Signed)
Left voicemail for return call  

## 2023-06-15 NOTE — Telephone Encounter (Signed)
Returned call to patient, left voicemail for return call.

## 2023-07-02 ENCOUNTER — Ambulatory Visit
Admission: RE | Admit: 2023-07-02 | Discharge: 2023-07-02 | Disposition: A | Discharge status: Home / Self Care | Payer: PPO | Source: Ambulatory Visit | Attending: Family Medicine | Admitting: Family Medicine

## 2023-07-02 DIAGNOSIS — M810 Age-related osteoporosis without current pathological fracture: Secondary | ICD-10-CM | POA: Diagnosis not present

## 2023-07-02 DIAGNOSIS — Z1231 Encounter for screening mammogram for malignant neoplasm of breast: Secondary | ICD-10-CM | POA: Insufficient documentation

## 2023-08-27 ENCOUNTER — Encounter: Payer: Self-pay | Admitting: Physician Assistant

## 2023-08-27 ENCOUNTER — Ambulatory Visit (INDEPENDENT_AMBULATORY_CARE_PROVIDER_SITE_OTHER): Payer: PPO | Admitting: Physician Assistant

## 2023-08-27 VITALS — BP 126/82 | HR 99 | Temp 98.3°F | Resp 16 | Ht 66.0 in | Wt 134.0 lb

## 2023-08-27 DIAGNOSIS — R0989 Other specified symptoms and signs involving the circulatory and respiratory systems: Secondary | ICD-10-CM | POA: Diagnosis not present

## 2023-08-27 NOTE — Progress Notes (Signed)
Acute Office Visit   Patient: Denise Perez   DOB: 10/21/1951   72 y.o. Female  MRN: [id number] Visit Date: 08/27/2023  Today's healthcare provider: Oswaldo Conroy Necola Bluestein, PA-C  Introduced myself to the patient as a Secondary school teacher and provided education on APPs in clinical practice.    Chief Complaint  Patient presents with   Cough   Nasal Congestion    Sx since last Tuesday, pt received flu shot the day before. Pt states had low grade fever Tues/Wed. Thursday and Fri she did a home COVID test which was negative   Subjective    HPI HPI     Nasal Congestion    Additional comments: Sx since last Tuesday, pt received flu shot the day before. Pt states had low grade fever Tues/Wed. Thursday and Fri she did a home COVID test which was negative      Last edited by Forde Radon, CMA on 08/27/2023 12:58 PM.        URI -type symptoms    Onset: sudden  Duration: started last Tuesday 08/18/23  Associated symptoms: sore throat, dry coughing spells, subjective low fevers, tenderness to sides of tongue  She is concerned for her lungs - would like to get them listened to  She reports concerns for potential mold in the home - has noticed some increased moisture along air ducts and black spots along walls   Interventions: Aspirin, zinc tablets  Recent travel: none   Recent sick contacts: none  COVID testing at home: yes, tested Thursday  - negative result    Medications: Outpatient Medications Prior to Visit  Medication Sig   Cholecalciferol (VITAMIN D) 2000 units CAPS Take 1 capsule (2,000 Units total) by mouth daily.   Omega-3 Fatty Acids (FISH OIL) 1000 MG CAPS Take 1 capsule by mouth daily.   Turmeric 400 MG CAPS Take 1 capsule by mouth daily.   No facility-administered medications prior to visit.    Review of Systems  Constitutional:  Positive for fever (subjective. max 99.4). Negative for chills.  HENT:  Positive for postnasal drip, rhinorrhea and sore  throat. Negative for congestion, ear pain, sinus pressure and sinus pain.   Respiratory:  Positive for cough. Negative for shortness of breath and wheezing.   Gastrointestinal:  Negative for diarrhea, nausea and vomiting.  Musculoskeletal:  Negative for myalgias.  Neurological:  Positive for headaches.        Objective    BP 126/82   Pulse 99   Temp 98.3 F (36.8 C) (Oral)   Resp 16   Ht 5\' 6"  (1.676 m)   Wt 134 lb (60.8 kg)   SpO2 97%   BMI 21.63 kg/m     Physical Exam Vitals reviewed.  Constitutional:      General: She is awake.     Appearance: Normal appearance. She is well-developed and well-groomed.  HENT:     Head: Normocephalic and atraumatic.     Mouth/Throat:     Lips: Pink.     Mouth: Mucous membranes are moist.     Pharynx: Uvula midline. No pharyngeal swelling, oropharyngeal exudate, posterior oropharyngeal erythema, uvula swelling or postnasal drip.  Eyes:     General: Lids are normal. Gaze aligned appropriately.     Extraocular Movements: Extraocular movements intact.  Cardiovascular:     Rate and Rhythm: Normal rate and regular rhythm.     Pulses: Normal pulses.  Radial pulses are 2+ on the right side and 2+ on the left side.     Heart sounds: Normal heart sounds. No murmur heard.    No friction rub. No gallop.  Pulmonary:     Effort: Pulmonary effort is normal.     Breath sounds: Normal breath sounds. No decreased air movement. No decreased breath sounds, wheezing, rhonchi or rales.  Musculoskeletal:     Cervical back: Normal range of motion.     Right lower leg: No edema.     Left lower leg: No edema.  Lymphadenopathy:     Head:     Right side of head: No submental, submandibular or preauricular adenopathy.     Left side of head: No submental, submandibular or preauricular adenopathy.     Cervical:     Right cervical: No superficial cervical adenopathy.    Left cervical: No superficial cervical adenopathy.     Upper Body:     Right  upper body: No supraclavicular adenopathy.     Left upper body: No supraclavicular adenopathy.  Skin:    General: Skin is warm and moist.  Neurological:     Mental Status: She is alert.  Psychiatric:        Attention and Perception: Attention and perception normal.        Mood and Affect: Mood and affect normal.        Speech: Speech normal.        Behavior: Behavior normal. Behavior is cooperative.       No results found for any visits on 08/27/23.  Assessment & Plan      No follow-ups on file.       Problem List Items Addressed This Visit   None Visit Diagnoses     Symptoms of upper respiratory infection (URI)    -  Primary Acute, new concern Patient reports postnasal drip rhinorrhea, sore throat since last Tuesday She reports negative home COVID test on Thursday Physical exam is overall reassuring Suspect this is likely allergic rhinitis given symptoms and HPI Recommend starting second-generation antihistamine along with Flonase to assist with management Reviewed ED and return precautions Follow-up as needed for progressing or persistent symptoms        No follow-ups on file.   I, Harvin Konicek E Benita Boonstra, PA-C, have reviewed all documentation for this visit. The documentation on 08/27/23 for the exam, diagnosis, procedures, and orders are all accurate and complete.   Jacquelin Hawking, MHS, PA-C [facility]

## 2023-08-27 NOTE — Patient Instructions (Signed)
  I also recommend adding an antihistamine to your daily regimen  This includes medications like Claritin, Allegra, Zyrtec- the generics of these work very well and are usually less expensive  I recommend using Flonase nasal spray - 2 puffs twice per day to help with your nasal congestion The antihistamines and Flonase can take a few weeks to provide significant relief from allergy symptoms but should start to provide some benefit soon.  

## 2023-09-17 DIAGNOSIS — L821 Other seborrheic keratosis: Secondary | ICD-10-CM | POA: Diagnosis not present

## 2023-09-17 DIAGNOSIS — D2262 Melanocytic nevi of left upper limb, including shoulder: Secondary | ICD-10-CM | POA: Diagnosis not present

## 2023-09-17 DIAGNOSIS — D2261 Melanocytic nevi of right upper limb, including shoulder: Secondary | ICD-10-CM | POA: Diagnosis not present

## 2023-09-17 DIAGNOSIS — D225 Melanocytic nevi of trunk: Secondary | ICD-10-CM | POA: Diagnosis not present

## 2023-09-17 DIAGNOSIS — L564 Polymorphous light eruption: Secondary | ICD-10-CM | POA: Diagnosis not present

## 2023-09-17 DIAGNOSIS — L570 Actinic keratosis: Secondary | ICD-10-CM | POA: Diagnosis not present

## 2023-09-17 DIAGNOSIS — D2272 Melanocytic nevi of left lower limb, including hip: Secondary | ICD-10-CM | POA: Diagnosis not present

## 2023-09-17 DIAGNOSIS — D2271 Melanocytic nevi of right lower limb, including hip: Secondary | ICD-10-CM | POA: Diagnosis not present

## 2023-09-18 DIAGNOSIS — L564 Polymorphous light eruption: Secondary | ICD-10-CM | POA: Diagnosis not present

## 2024-03-31 DIAGNOSIS — H2513 Age-related nuclear cataract, bilateral: Secondary | ICD-10-CM | POA: Diagnosis not present

## 2024-03-31 DIAGNOSIS — H0288A Meibomian gland dysfunction right eye, upper and lower eyelids: Secondary | ICD-10-CM | POA: Diagnosis not present

## 2024-03-31 DIAGNOSIS — H524 Presbyopia: Secondary | ICD-10-CM | POA: Diagnosis not present

## 2024-03-31 DIAGNOSIS — H5213 Myopia, bilateral: Secondary | ICD-10-CM | POA: Diagnosis not present

## 2024-03-31 DIAGNOSIS — H0288B Meibomian gland dysfunction left eye, upper and lower eyelids: Secondary | ICD-10-CM | POA: Diagnosis not present

## 2024-05-26 ENCOUNTER — Ambulatory Visit (INDEPENDENT_AMBULATORY_CARE_PROVIDER_SITE_OTHER): Payer: PPO | Admitting: Family Medicine

## 2024-05-26 ENCOUNTER — Encounter: Payer: Self-pay | Admitting: Family Medicine

## 2024-05-26 VITALS — BP 128/84 | HR 74 | Resp 16 | Ht 66.0 in | Wt 135.9 lb

## 2024-05-26 DIAGNOSIS — M810 Age-related osteoporosis without current pathological fracture: Secondary | ICD-10-CM

## 2024-05-26 DIAGNOSIS — E559 Vitamin D deficiency, unspecified: Secondary | ICD-10-CM | POA: Diagnosis not present

## 2024-05-26 DIAGNOSIS — Z1211 Encounter for screening for malignant neoplasm of colon: Secondary | ICD-10-CM

## 2024-05-26 DIAGNOSIS — I700 Atherosclerosis of aorta: Secondary | ICD-10-CM | POA: Diagnosis not present

## 2024-05-26 DIAGNOSIS — Z1231 Encounter for screening mammogram for malignant neoplasm of breast: Secondary | ICD-10-CM | POA: Diagnosis not present

## 2024-05-26 DIAGNOSIS — E785 Hyperlipidemia, unspecified: Secondary | ICD-10-CM

## 2024-05-26 NOTE — Progress Notes (Signed)
 Name: Denise Perez   MRN: [id number]    DOB: 07/18/1951   Date:05/26/2024       Progress Note  Subjective  Chief Complaint  Chief Complaint  Patient presents with   Medical Management of Chronic Issues   Discussed the use of AI scribe software for clinical note transcription with the patient, who gave verbal consent to proceed.  History of Present Illness Denise Perez is a 73 year old female with atherosclerosis of the aorta and osteoporosis who presents for her annual follow-up visit.  She has  atherosclerosis of the aorta She is not taking any medications for this condition, including statins. Her LDL cholesterol has decreased from 153 mg/dL to 867 mg/dL over the past year, and her total cholesterol has decreased from 250 mg/dL to 767 mg/dL. She attributes this improvement to dietary changes, including consuming avocado every other day.  She has osteoporosis, particularly affecting her spine (L1, L2) and femur, with bone density scores of -2.5 and -2.8, respectively. She is not on medication for osteoporosis but is focusing on dietary supplements, including calcium , vitamin D3, and zinc gummies. She is unsure of the vitamin D  dosage in her gummies and plans to supplement with additional vitamin D3 to reach a total of 2000 IU daily. She has discontinued turmeric and fish oil supplements.  She engages in regular physical activity, attending [employer] for treadmill walking on Mondays and Wednesdays and participating in water aerobics at the [facility] on Tuesdays and Thursdays. She has ankle weights but has not yet started weight training with hand weights.  Her husband, who is 2 years old, is experiencing memory issues and is under the care of a neurologist. He recently had an episode of temporary vision loss in his right eye, prompting further medical evaluation.  She is due for a mammogram and colonoscopy, with her last colonoscopy performed in 2020. She is proactive in managing her  health, including monitoring her cholesterol and vitamin D  levels.    Patient Active Problem List   Diagnosis Date Noted   Vitamin D  deficiency 05/09/2022   COVID-19 long hauler 05/09/2022   Atherosclerosis of aorta (HCC) 05/07/2020   FH: colon cancer 05/30/2019   Dyslipidemia 05/28/2016   Osteoporosis 05/28/2016   Cervical disc disease 05/28/2016   Primary osteoarthritis involving multiple joints 06/11/2015   Synovial cyst of wrist 06/11/2015    Past Surgical History:  Procedure Laterality Date   COLONOSCOPY WITH PROPOFOL  N/A 08/13/2015   Procedure: COLONOSCOPY WITH PROPOFOL ;  Surgeon: Deward CINDERELLA Piedmont, MD;  Location: [facility] ENDOSCOPY;  Service: Gastroenterology;  Laterality: N/A;   COLONOSCOPY WITH PROPOFOL  N/A 06/29/2019   Procedure: COLONOSCOPY WITH PROPOFOL ;  Surgeon: Toledo, Ladell POUR, MD;  Location: [facility] ENDOSCOPY;  Service: Gastroenterology;  Laterality: N/A;   ESOPHAGOGASTRODUODENOSCOPY (EGD) WITH PROPOFOL      ESOPHAGOGASTRODUODENOSCOPY (EGD) WITH PROPOFOL  N/A 06/29/2019   Procedure: ESOPHAGOGASTRODUODENOSCOPY (EGD) WITH PROPOFOL ;  Surgeon: Toledo, Ladell POUR, MD;  Location: [facility] ENDOSCOPY;  Service: Gastroenterology;  Laterality: N/A;   GUM SURGERY     TOOTH EXTRACTION      Family History  Problem Relation Age of Onset   Cancer Father        Colon   Breast cancer Neg Hx     Social History   Tobacco Use   Smoking status: Former    Current packs/day: 0.00    Average packs/day: 1.5 packs/day for 15.0 years (22.5 ttl pk-yrs)    Types: Cigarettes    Start date:  11/17/1970    Quit date: 11/17/1985    Years since quitting: 38.5   Smokeless tobacco: Never   Tobacco comments:    smoking cessation materials not required  Substance Use Topics   Alcohol use: Not Currently    Alcohol/week: 0.0 standard drinks of alcohol    Comment: 5 glasses of wine a year     Current Outpatient Medications:    MULTIPLE VITAMIN PO, Take by mouth. ZINC/CALCIUM /D3, Disp: , Rfl:     Cholecalciferol (VITAMIN D ) 2000 units CAPS, Take 1 capsule (2,000 Units total) by mouth daily. (Patient not taking: Reported on 05/26/2024), Disp: 30 capsule, Rfl: 0  Allergies  Allergen Reactions   Prednisone Other (See Comments)    Shaking    I personally reviewed active problem list, medication list, allergies, family history with the patient/caregiver today.   ROS  Ten systems reviewed and is negative except as mentioned in HPI    Objective Physical Exam CONSTITUTIONAL: Patient appears well-developed and well-nourished. No distress. HEENT: Head atraumatic, normocephalic, neck supple. Throat normal. CARDIOVASCULAR: Normal rate, regular rhythm and normal heart sounds. No murmur heard. No BLE edema. PULMONARY: Effort normal and breath sounds normal. No respiratory distress. ABDOMINAL: There is no tenderness or distention. MUSCULOSKELETAL: Normal gait. Without gross motor or sensory deficit. PSYCHIATRIC: Patient has a normal mood and affect. Behavior is normal. Judgment and thought content normal.  Vitals:   05/26/24 0936  BP: 128/84  Pulse: 74  Resp: 16  SpO2: 99%  Weight: 135 lb 14.4 oz (61.6 kg)  Height: 5' 6 (1.676 m)    Body mass index is 21.93 kg/m.    PHQ2/9:    05/26/2024    9:31 AM 08/27/2023   12:47 PM 05/15/2023    7:52 AM 05/14/2023    9:26 AM 04/17/2023   11:40 AM  Depression screen PHQ 2/9  Decreased Interest 0 0 0 0 0  Down, Depressed, Hopeless 0 0 0 0 0  PHQ - 2 Score 0 0 0 0 0  Altered sleeping  0 0 0 0  Tired, decreased energy  0 0 0 0  Change in appetite  0 0 0 0  Feeling bad or failure about yourself   0 0 0 0  Trouble concentrating  0 0 0 0  Moving slowly or fidgety/restless  0 0 0 0  Suicidal thoughts  0 0 0 0  PHQ-9 Score  0 0 0 0  Difficult doing work/chores  Not difficult at all   Not difficult at all    phq 9 is negative  Fall Risk:    05/26/2024    9:31 AM 08/27/2023   12:47 PM 05/15/2023    7:52 AM 05/14/2023    9:25 AM  04/17/2023   11:34 AM  Fall Risk   Falls in the past year? 0 0 1 1 1   Number falls in past yr: 0 0 0 0 0  Injury with Fall? 0 0 1 1 0  Risk for fall due to : No Fall Risks No Fall Risks No Fall Risks No Fall Risks Impaired balance/gait  Follow up Falls evaluation completed Falls prevention discussed;Education provided;Falls evaluation completed Falls prevention discussed Falls prevention discussed Falls prevention discussed;Education provided;Falls evaluation completed     Assessment & Plan Atherosclerosis of the aorta with hyperlipidemia Plaque formation on the aorta with decreased LDL and total cholesterol over the past year. - Check cholesterol levels. - Discuss statin therapy to decrease risk of myocardial infarction and cerebrovascular accident,  stabilize plaques, and prevent further plaque formation. - Incorporate dietary management with avocado.  Osteoporosis of spine and femur Osteoporosis in spine and femur with low bone mass and T-scores of -2.5 and -2.8. - Recommend high calcium  diet and vitamin D  supplementation. - Encourage weight training. - Discuss potential need for medication if bone mass decreases. - Discuss bone density testing.  Vitamin D  deficiency Previous deficiency with current supplementation of gummies containing calcium , D3, and zinc. - Check vitamin D  levels. - Recommend additional vitamin D  supplementation if current intake is insufficient.

## 2024-05-27 ENCOUNTER — Other Ambulatory Visit: Payer: Self-pay | Admitting: Family Medicine

## 2024-05-27 ENCOUNTER — Ambulatory Visit: Payer: Self-pay | Admitting: Family Medicine

## 2024-05-27 DIAGNOSIS — Z1231 Encounter for screening mammogram for malignant neoplasm of breast: Secondary | ICD-10-CM

## 2024-05-27 LAB — CBC WITH DIFFERENTIAL/PLATELET
Absolute Lymphocytes: 1722 {cells}/uL (ref 850–3900)
Absolute Monocytes: 451 {cells}/uL (ref 200–950)
Basophils Absolute: 39 {cells}/uL (ref 0–200)
Basophils Relative: 0.7 %
Eosinophils Absolute: 88 {cells}/uL (ref 15–500)
Eosinophils Relative: 1.6 %
HCT: 42.8 % (ref 35.0–45.0)
Hemoglobin: 14.2 g/dL (ref 11.7–15.5)
MCH: 31.4 pg (ref 27.0–33.0)
MCHC: 33.2 g/dL (ref 32.0–36.0)
MCV: 94.7 fL (ref 80.0–100.0)
MPV: 9.9 fL (ref 7.5–12.5)
Monocytes Relative: 8.2 %
Neutro Abs: 3201 {cells}/uL (ref 1500–7800)
Neutrophils Relative %: 58.2 %
Platelets: 220 Thousand/uL (ref 140–400)
RBC: 4.52 Million/uL (ref 3.80–5.10)
RDW: 14 % (ref 11.0–15.0)
Total Lymphocyte: 31.3 %
WBC: 5.5 Thousand/uL (ref 3.8–10.8)

## 2024-05-27 LAB — COMPREHENSIVE METABOLIC PANEL WITH GFR
AG Ratio: 1.8 (calc) (ref 1.0–2.5)
ALT: 15 U/L (ref 6–29)
AST: 22 U/L (ref 10–35)
Albumin: 4.2 g/dL (ref 3.6–5.1)
Alkaline phosphatase (APISO): 92 U/L (ref 37–153)
BUN: 13 mg/dL (ref 7–25)
CO2: 28 mmol/L (ref 20–32)
Calcium: 9.2 mg/dL (ref 8.6–10.4)
Chloride: 107 mmol/L (ref 98–110)
Creat: 0.89 mg/dL (ref 0.60–1.00)
Globulin: 2.3 g/dL (ref 1.9–3.7)
Glucose, Bld: 90 mg/dL (ref 65–99)
Potassium: 5.1 mmol/L (ref 3.5–5.3)
Sodium: 141 mmol/L (ref 135–146)
Total Bilirubin: 0.4 mg/dL (ref 0.2–1.2)
Total Protein: 6.5 g/dL (ref 6.1–8.1)
eGFR: 68 mL/min/1.73m2 (ref >=60)

## 2024-05-27 LAB — LIPID PANEL
Cholesterol: 243 mg/dL — ABNORMAL HIGH (ref <200)
HDL: 88 mg/dL (ref >=50)
LDL Cholesterol (Calc): 141 mg/dL — ABNORMAL HIGH
Non-HDL Cholesterol (Calc): 155 mg/dL — ABNORMAL HIGH (ref <130)
Total CHOL/HDL Ratio: 2.8 (calc) (ref <5.0)
Triglycerides: 53 mg/dL (ref <150)

## 2024-05-27 LAB — VITAMIN D 25 HYDROXY (VIT D DEFICIENCY, FRACTURES): Vit D, 25-Hydroxy: 44 ng/mL (ref 30–100)

## 2024-05-30 ENCOUNTER — Ambulatory Visit (INDEPENDENT_AMBULATORY_CARE_PROVIDER_SITE_OTHER)

## 2024-05-30 ENCOUNTER — Ambulatory Visit
Admission: EM | Admit: 2024-05-30 | Discharge: 2024-05-30 | Disposition: A | Discharge status: Home / Self Care | Attending: Emergency Medicine | Admitting: Emergency Medicine

## 2024-05-30 DIAGNOSIS — S62617A Displaced fracture of proximal phalanx of left little finger, initial encounter for closed fracture: Secondary | ICD-10-CM

## 2024-05-30 DIAGNOSIS — S62609A Fracture of unspecified phalanx of unspecified finger, initial encounter for closed fracture: Secondary | ICD-10-CM | POA: Diagnosis not present

## 2024-05-30 NOTE — ED Provider Notes (Signed)
 HPI  SUBJECTIVE:  Denise Perez is a right-handed 73 y.o. female who presents with left little finger pain, swelling, bruising after she got it caught on a blanket earlier this morning while trying to get out of bed rapidly.  States that she heard a pop followed by diffuse finger pain described as dull, constant, achy that becomes sharp with palpation.  She is able to use it, albeit with pain.  No numbness or tingling.  She pulled on the finger in case it was dislocated and then buddy taped her ring and little finger with improvement in her symptoms.  Symptoms are worse with palpation.  She has a past medical history of osteoporosis and GERD.  No history of chronic kidney disease  Past Medical History:  Diagnosis Date   Cervical disc disease    Cervical neuropathy    GERD (gastroesophageal reflux disease)    Osteoporosis    Tietze's disease     Past Surgical History:  Procedure Laterality Date   COLONOSCOPY WITH PROPOFOL  N/A 08/13/2015   Procedure: COLONOSCOPY WITH PROPOFOL ;  Surgeon: Deward CINDERELLA Piedmont, MD;  Location: [facility] ENDOSCOPY;  Service: Gastroenterology;  Laterality: N/A;   COLONOSCOPY WITH PROPOFOL  N/A 06/29/2019   Procedure: COLONOSCOPY WITH PROPOFOL ;  Surgeon: Toledo, Ladell POUR, MD;  Location: [facility] ENDOSCOPY;  Service: Gastroenterology;  Laterality: N/A;   ESOPHAGOGASTRODUODENOSCOPY (EGD) WITH PROPOFOL      ESOPHAGOGASTRODUODENOSCOPY (EGD) WITH PROPOFOL  N/A 06/29/2019   Procedure: ESOPHAGOGASTRODUODENOSCOPY (EGD) WITH PROPOFOL ;  Surgeon: Toledo, Ladell POUR, MD;  Location: [facility] ENDOSCOPY;  Service: Gastroenterology;  Laterality: N/A;   GUM SURGERY     TOOTH EXTRACTION      Family History  Problem Relation Age of Onset   Cancer Father        Colon   Breast cancer Neg Hx     Social History   Tobacco Use   Smoking status: Former    Current packs/day: 0.00    Average packs/day: 1.5 packs/day for 15.0 years (22.5 ttl pk-yrs)    Types: Cigarettes    Start date: 11/17/1970    Quit  date: 11/17/1985    Years since quitting: 38.5   Smokeless tobacco: Never   Tobacco comments:    smoking cessation materials not required  Vaping Use   Vaping status: Never Used  Substance Use Topics   Alcohol use: Not Currently    Alcohol/week: 0.0 standard drinks of alcohol    Comment: 5 glasses of wine a year   Drug use: No    No current facility-administered medications for this encounter.  Current Outpatient Medications:    Cholecalciferol (VITAMIN D ) 2000 units CAPS, Take 1 capsule (2,000 Units total) by mouth daily. (Patient not taking: Reported on 05/26/2024), Disp: 30 capsule, Rfl: 0   MULTIPLE VITAMIN PO, Take by mouth. ZINC/CALCIUM /D3, Disp: , Rfl:   Allergies  Allergen Reactions   Prednisone Other (See Comments)    Shaking     ROS  As noted in HPI.   Physical Exam  BP (!) 159/80 (BP Location: Right Arm)   Pulse 68   Temp 99.2 F (37.3 C) (Oral)   Resp 16   SpO2 98%   Constitutional: Well developed, well nourished, no acute distress Eyes:  EOMI, conjunctiva normal bilaterally HENT: Normocephalic, atraumatic,mucus membranes moist Respiratory: Normal inspiratory effort Cardiovascular: Normal rate GI: nondistended skin: No rash, skin intact Musculoskeletal: Tenderness, swelling, bruising along the left little finger.  Bruising at the base of the little and ring fingers.  Baseline Strength  and Sensation to L hand with normal light touch intact,  CR< 2 secs. little finger with intact motor strength 5/5 flexion / extension / FDP  / FDS against resistance and 2-point discrimination intact at 5mm in little finger. Skin intact.  Rest of hand, wrist WNL.       Neurologic: Alert & oriented x 3, no focal neuro deficits Psychiatric: Speech and behavior appropriate   ED Course   Medications - No data to display  Orders Placed This Encounter  Procedures   DG Hand Complete Left    Standing Status:   Standing    Number of Occurrences:   1    Reason for Exam  (SYMPTOM  OR DIAGNOSIS REQUIRED):   fell   Apply other splint    Standing Status:   Standing    Number of Occurrences:   1    Laterality:   Left    Splint type:   Ulnar gutter splint    No results found for this or any previous visit (from the past 24 hours). DG Hand Complete Left Result Date: 05/30/2024 CLINICAL DATA:  Fall, left hand injury, pain EXAM: LEFT HAND - COMPLETE 3+ VIEW COMPARISON:  None Available. FINDINGS: There is a fracture noted in the proximal phalanx of the left little finger proximally mild angulation. No subluxation or dislocation. Joint space narrowing throughout the IP joints and in the left wrist. IMPRESSION: Mildly angulated fracture at the base of the left little finger proximal phalanx. Electronically Signed   By: Franky Crease M.D.   On: 05/30/2024 14:08    ED Clinical Impression  1. Displaced fracture of proximal phalanx of left little finger, initial encounter for closed fracture      ED Assessment/Plan     Reviewed imaging independently.  Mildly angulated fracture of the base of the left little finger proximal phalanx.  See radiology report for full details.  Patient presents with left little finger fracture.  Will place an ulnar gutter splint.  Advised 200 400 mg of ibuprofen combined with 500 to 1000 mg Tylenol 3-4 times a day as needed for pain.  She declined a prescription for Norco.  Ice, elevate above the heart is much as possible.  Advised follow-up with [facility] ASAP/ideally within a week.  Offered to place referral to Ortho care in [place], but she has opted to go to [facility].  Discussed  imaging, MDM, treatment plan, and plan for follow-up with patient. . patient agrees with plan.   No orders of the defined types were placed in this encounter.     *This clinic note was created using Dragon dictation software. Therefore, there may be occasional mistakes despite careful proofreading.  ?    Van Knee, MD 05/30/24 1535

## 2024-05-30 NOTE — Discharge Instructions (Addendum)
 Your x-ray report reads: There is a fracture noted in the proximal phalanx of the left little finger proximally mild angulation. No subluxation or dislocation. Joint space narrowing throughout the IP joints and in the left wrist. IMPRESSION: Mildly angulated fracture at the base of the left little finger proximal phalanx.  200-400 mg of ibuprofen combined with 500 to 1000 mg Tylenol 3-4 times a day as needed for pain.   Ice, elevate above the heart is much as possible.  follow-up with [facility] ASAP.  You can either go to the urgent care, where you do not need an appointment, or you can call and schedule an appointment.  I would like for you to be seen within the week.

## 2024-05-30 NOTE — ED Triage Notes (Signed)
 Patient states that she has leg cramps during her sleep. Jumped up out of bed to keep from getting an cramp and fell. Injured her left hand and fingers.

## 2024-05-31 DIAGNOSIS — M7502 Adhesive capsulitis of left shoulder: Secondary | ICD-10-CM | POA: Diagnosis not present

## 2024-06-03 DIAGNOSIS — M810 Age-related osteoporosis without current pathological fracture: Secondary | ICD-10-CM | POA: Diagnosis not present

## 2024-06-03 DIAGNOSIS — S62617A Displaced fracture of proximal phalanx of left little finger, initial encounter for closed fracture: Secondary | ICD-10-CM | POA: Diagnosis not present

## 2024-06-07 DIAGNOSIS — S62617D Displaced fracture of proximal phalanx of left little finger, subsequent encounter for fracture with routine healing: Secondary | ICD-10-CM | POA: Diagnosis not present

## 2024-06-07 DIAGNOSIS — S62618A Displaced fracture of proximal phalanx of other finger, initial encounter for closed fracture: Secondary | ICD-10-CM | POA: Insufficient documentation

## 2024-06-28 DIAGNOSIS — Z4889 Encounter for other specified surgical aftercare: Secondary | ICD-10-CM | POA: Diagnosis not present

## 2024-07-05 DIAGNOSIS — S62617D Displaced fracture of proximal phalanx of left little finger, subsequent encounter for fracture with routine healing: Secondary | ICD-10-CM | POA: Diagnosis not present

## 2024-07-07 ENCOUNTER — Ambulatory Visit
Admission: RE | Admit: 2024-07-07 | Discharge: 2024-07-07 | Disposition: A | Discharge status: Home / Self Care | Source: Ambulatory Visit | Attending: Family Medicine | Admitting: Family Medicine

## 2024-07-07 DIAGNOSIS — Z1231 Encounter for screening mammogram for malignant neoplasm of breast: Secondary | ICD-10-CM | POA: Insufficient documentation

## 2024-07-12 DIAGNOSIS — S62617D Displaced fracture of proximal phalanx of left little finger, subsequent encounter for fracture with routine healing: Secondary | ICD-10-CM | POA: Diagnosis not present

## 2024-07-21 DIAGNOSIS — S62617D Displaced fracture of proximal phalanx of left little finger, subsequent encounter for fracture with routine healing: Secondary | ICD-10-CM | POA: Diagnosis not present

## 2024-07-26 DIAGNOSIS — S62617D Displaced fracture of proximal phalanx of left little finger, subsequent encounter for fracture with routine healing: Secondary | ICD-10-CM | POA: Diagnosis not present

## 2024-08-09 DIAGNOSIS — S62617D Displaced fracture of proximal phalanx of left little finger, subsequent encounter for fracture with routine healing: Secondary | ICD-10-CM | POA: Diagnosis not present

## 2024-08-18 ENCOUNTER — Encounter: Admitting: Family Medicine

## 2024-08-18 NOTE — Patient Instructions (Signed)
 Preventive Care 83 Years and Older, Female Preventive care refers to lifestyle choices and visits with your health care provider that can promote health and wellness. Preventive care visits are also called wellness exams. What can I expect for my preventive care visit? Counseling Your health care provider may ask you questions about your: Medical history, including: Past medical problems. Family medical history. Pregnancy and menstrual history. History of falls. Current health, including: Memory and ability to understand (cognition). Emotional well-being. Home life and relationship well-being. Sexual activity and sexual health. Lifestyle, including: Alcohol, nicotine or tobacco, and drug use. Access to firearms. Diet, exercise, and sleep habits. Work and work Astronomer. Sunscreen use. Safety issues such as seatbelt and bike helmet use. Physical exam Your health care provider will check your: Height and weight. These may be used to calculate your BMI (body mass index). BMI is a measurement that tells if you are at a healthy weight. Waist circumference. This measures the distance around your waistline. This measurement also tells if you are at a healthy weight and may help predict your risk of certain diseases, such as type 2 diabetes and high blood pressure. Heart rate and blood pressure. Body temperature. Skin for abnormal spots. What immunizations do I need?  Vaccines are usually given at various ages, according to a schedule. Your health care provider will recommend vaccines for you based on your age, medical history, and lifestyle or other factors, such as travel or where you work. What tests do I need? Screening Your health care provider may recommend screening tests for certain conditions. This may include: Lipid and cholesterol levels. Hepatitis C test. Hepatitis B test. HIV (human immunodeficiency virus) test. STI (sexually transmitted infection) testing, if you are at  risk. Lung cancer screening. Colorectal cancer screening. Diabetes screening. This is done by checking your blood sugar (glucose) after you have not eaten for a while (fasting). Mammogram. Talk with your health care provider about how often you should have regular mammograms. BRCA-related cancer screening. This may be done if you have a family history of breast, ovarian, tubal, or peritoneal cancers. Bone density scan. This is done to screen for osteoporosis. Talk with your health care provider about your test results, treatment options, and if necessary, the need for more tests. Follow these instructions at home: Eating and drinking  Eat a diet that includes fresh fruits and vegetables, whole grains, lean protein, and low-fat dairy products. Limit your intake of foods with high amounts of sugar, saturated fats, and salt. Take vitamin and mineral supplements as recommended by your health care provider. Do not drink alcohol if your health care provider tells you not to drink. If you drink alcohol: Limit how much you have to 0-1 drink a day. Know how much alcohol is in your drink. In the [place] one drink equals one 12 oz bottle of beer (355 mL), one 5 oz glass of wine (148 mL), or one 1 oz glass of hard liquor (44 mL). Lifestyle Brush your teeth every morning and night with fluoride toothpaste. Floss one time each day. Exercise for at least 30 minutes 5 or more days each week. Do not use any products that contain nicotine or tobacco. These products include cigarettes, chewing tobacco, and vaping devices, such as e-cigarettes. If you need help quitting, ask your health care provider. Do not use drugs. If you are sexually active, practice safe sex. Use a condom or other form of protection in order to prevent STIs. Take aspirin only as told by  your health care provider. Make sure that you understand how much to take and what form to take. Work with your health care provider to find out whether it  is safe and beneficial for you to take aspirin daily. Ask your health care provider if you need to take a cholesterol-lowering medicine (statin). Find healthy ways to manage stress, such as: Meditation, yoga, or listening to music. Journaling. Talking to a trusted person. Spending time with friends and family. Minimize exposure to UV radiation to reduce your risk of skin cancer. Safety Always wear your seat belt while driving or riding in a vehicle. Do not drive: If you have been drinking alcohol. Do not ride with someone who has been drinking. When you are tired or distracted. While texting. If you have been using any mind-altering substances or drugs. Wear a helmet and other protective equipment during sports activities. If you have firearms in your house, make sure you follow all gun safety procedures. What's next? Visit your health care provider once a year for an annual wellness visit. Ask your health care provider how often you should have your eyes and teeth checked. Stay up to date on all vaccines. This information is not intended to replace advice given to you by your health care provider. Make sure you discuss any questions you have with your health care provider. Document Revised: 05/01/2021 Document Reviewed: 05/01/2021 Elsevier Patient Education  2024 [employer].

## 2024-08-19 ENCOUNTER — Ambulatory Visit (INDEPENDENT_AMBULATORY_CARE_PROVIDER_SITE_OTHER): Admitting: Family Medicine

## 2024-08-19 ENCOUNTER — Encounter: Payer: Self-pay | Admitting: Family Medicine

## 2024-08-19 VITALS — BP 120/72 | HR 82 | Temp 98.0°F | Resp 16 | Ht 66.0 in | Wt 137.0 lb

## 2024-08-19 DIAGNOSIS — Z0000 Encounter for general adult medical examination without abnormal findings: Secondary | ICD-10-CM | POA: Diagnosis not present

## 2024-08-19 DIAGNOSIS — M810 Age-related osteoporosis without current pathological fracture: Secondary | ICD-10-CM | POA: Insufficient documentation

## 2024-08-19 DIAGNOSIS — Z23 Encounter for immunization: Secondary | ICD-10-CM | POA: Diagnosis not present

## 2024-08-19 DIAGNOSIS — M7500 Adhesive capsulitis of unspecified shoulder: Secondary | ICD-10-CM | POA: Insufficient documentation

## 2024-08-19 NOTE — Progress Notes (Signed)
 Name: Denise Perez   MRN: [id number]    DOB: [birth year]   Date:08/19/2024       Progress Note  Subjective  Chief Complaint  Chief Complaint  Patient presents with   Annual Exam    HPI  Patient presents for annual CPE.  Diet: balanced, cooks at home  Exercise: continue regular physical activity   Last Eye Exam: completed Last Dental Exam: completed  Flowsheet Row Office Visit from 08/19/2024 in [facility]  AUDIT-C Score 0   Depression: Phq 9 is  negative    08/19/2024    8:35 AM 05/26/2024    9:31 AM 08/27/2023   12:47 PM 05/15/2023    7:52 AM 05/14/2023    9:26 AM  Depression screen PHQ 2/9  Decreased Interest 0 0 0 0 0  Down, Depressed, Hopeless 0 0 0 0 0  PHQ - 2 Score 0 0 0 0 0  Altered sleeping   0 0 0  Tired, decreased energy   0 0 0  Change in appetite   0 0 0  Feeling bad or failure about yourself    0 0 0  Trouble concentrating   0 0 0  Moving slowly or fidgety/restless   0 0 0  Suicidal thoughts   0 0 0  PHQ-9 Score   0 0 0  Difficult doing work/chores   Not difficult at all     Hypertension: BP Readings from Last 3 Encounters:  08/19/24 120/72  05/30/24 (!) 159/80  05/26/24 128/84   Obesity: Wt Readings from Last 3 Encounters:  08/19/24 137 lb (62.1 kg)  05/26/24 135 lb 14.4 oz (61.6 kg)  08/27/23 134 lb (60.8 kg)   BMI Readings from Last 3 Encounters:  08/19/24 22.11 kg/m  05/26/24 21.93 kg/m  08/27/23 21.63 kg/m     Vaccines: Flu thost today, reviewed with the patient.   Hep C Screening: completed STD testing and prevention (HIV/chl/gon/syphilis): N/A Intimate partner violence: negative screen  Sexual History : not sexually active  Menstrual History/LMP/Abnormal Bleeding: post menopausal  Discussed importance of follow up if any post-menopausal bleeding: yes  Incontinence Symptoms: negative for symptoms   Breast cancer:  - Last Mammogram: up to date  - BRCA gene screening: N/A  Osteoporosis  Prevention : Discussed high calcium  and vitamin D  supplementation, weight bearing exercises Bone density :yes , she has osteoporosis, printing information again about medication  Cervical cancer screening: not applicable due to age  Skin cancer: Discussed monitoring for atypical lesions  Colorectal cancer: already scheduled    Lung cancer:  Low Dose CT Chest recommended if Age 59-80 years, 20 pack-year currently smoking OR have quit w/in 15years. Patient does not qualify for screen   ECG: 2022  Advanced Care Planning: A voluntary discussion about advance care planning including the explanation and discussion of advance directives.  Discussed health care proxy and Living will, and the patient was able to identify a health care proxy as husband .  Patient does have a living will and power of attorney of health care   Patient Active Problem List   Diagnosis Date Noted   Adhesive capsulitis of shoulder 08/19/2024   Closed fracture of proximal phalanx of little finger 06/07/2024   Vitamin D  deficiency 05/09/2022   COVID-19 long hauler 05/09/2022   Atherosclerosis of aorta 05/07/2020   FH: colon cancer 05/30/2019   Dyslipidemia 05/28/2016   Osteoporosis 05/28/2016   Cervical disc disease 05/28/2016   Primary osteoarthritis  involving multiple joints 06/11/2015   Synovial cyst of wrist 06/11/2015    Past Surgical History:  Procedure Laterality Date   COLONOSCOPY WITH PROPOFOL  N/A 08/13/2015   Procedure: COLONOSCOPY WITH PROPOFOL ;  Surgeon: Deward CINDERELLA Piedmont, MD;  Location: [facility] ENDOSCOPY;  Service: Gastroenterology;  Laterality: N/A;   COLONOSCOPY WITH PROPOFOL  N/A 06/29/2019   Procedure: COLONOSCOPY WITH PROPOFOL ;  Surgeon: Toledo, Ladell POUR, MD;  Location: [facility] ENDOSCOPY;  Service: Gastroenterology;  Laterality: N/A;   ESOPHAGOGASTRODUODENOSCOPY (EGD) WITH PROPOFOL      ESOPHAGOGASTRODUODENOSCOPY (EGD) WITH PROPOFOL  N/A 06/29/2019   Procedure: ESOPHAGOGASTRODUODENOSCOPY (EGD) WITH PROPOFOL ;   Surgeon: Toledo, Ladell POUR, MD;  Location: [facility] ENDOSCOPY;  Service: Gastroenterology;  Laterality: N/A;   GUM SURGERY     TOOTH EXTRACTION      Family History  Problem Relation Age of Onset   Cancer Father        Colon   Breast cancer Neg Hx     Social History   Socioeconomic History   Marital status: Married    Spouse name: Lupita   Number of children: 0   Years of education: Not on file   Highest education level: Bachelor's degree (e.g., BA, AB, BS)  Occupational History   Occupation: Retired  Tobacco Use   Smoking status: Former    Current packs/day: 0.00    Average packs/day: 1.5 packs/day for 15.0 years (22.5 ttl pk-yrs)    Types: Cigarettes    Start date: 11/17/1970    Quit date: 11/17/1985    Years since quitting: 38.7   Smokeless tobacco: Never   Tobacco comments:    smoking cessation materials not required  Vaping Use   Vaping status: Never Used  Substance and Sexual Activity   Alcohol use: Not Currently    Alcohol/week: 0.0 standard drinks of alcohol    Comment: 5 glasses of wine a year   Drug use: No   Sexual activity: Not Currently    Partners: Male  Other Topics Concern   Not on file  Social History Narrative   Not on file   Social Drivers of Health   Financial Resource Strain: Low Risk  (08/19/2024)   Overall Financial Resource Strain (CARDIA)    Difficulty of Paying Living Expenses: Not hard at all  Food Insecurity: No Food Insecurity (08/19/2024)   Hunger Vital Sign    Worried About Running Out of Food in the Last Year: Never true    Ran Out of Food in the Last Year: Never true  Transportation Needs: No Transportation Needs (08/19/2024)   PRAPARE - Administrator, Civil Service (Medical): No    Lack of Transportation (Non-Medical): No  Physical Activity: Insufficiently Active (08/19/2024)   Exercise Vital Sign    Days of Exercise per Week: 4 days    Minutes of Exercise per Session: 30 min  Stress: No Stress Concern Present (08/19/2024)    [employer] of Occupational Health - Occupational Stress Questionnaire    Feeling of Stress: Not at all  Social Connections: Socially Integrated (08/19/2024)   Social Connection and Isolation Panel    Frequency of Communication with Friends and Family: More than three times a week    Frequency of Social Gatherings with Friends and Family: More than three times a week    Attends Religious Services: More than 4 times per year    Active Member of [employer] or Organizations: Yes    Attends Banker Meetings: More than 4 times per year  Marital Status: Married  Catering manager Violence: Not At Risk (08/19/2024)   Humiliation, Afraid, Rape, and Kick questionnaire    Fear of Current or Ex-Partner: No    Emotionally Abused: No    Physically Abused: No    Sexually Abused: No     Current Outpatient Medications:    MULTIPLE VITAMIN PO, Take by mouth. ZINC/CALCIUM /D3, Disp: , Rfl:    Cholecalciferol (VITAMIN D ) 2000 units CAPS, Take 1 capsule (2,000 Units total) by mouth daily. (Patient not taking: Reported on 08/19/2024), Disp: 30 capsule, Rfl: 0  Allergies  Allergen Reactions   Prednisone Other (See Comments)    Shaking     ROS  Constitutional: Negative for fever or weight change.  Respiratory: Negative for cough and shortness of breath.   Cardiovascular: Negative for chest pain or palpitations.  Gastrointestinal: Negative for abdominal pain, no bowel changes.  Musculoskeletal: Negative for gait problem , she fractured left 5th finger and is finishing PT Skin: Negative for rash.  Neurological: Negative for dizziness or headache.  No other specific complaints in a complete review of systems (except as listed in HPI above).   Objective  Vitals:   08/19/24 0838  BP: 120/72  Pulse: 82  Resp: 16  Temp: 98 F (36.7 C)  TempSrc: Oral  SpO2: 97%  Weight: 137 lb (62.1 kg)  Height: 5' 6 (1.676 m)    Body mass index is 22.11 kg/m.  Physical  Exam  Constitutional: Patient appears well-developed and well-nourished. No distress.  HENT: Head: Normocephalic and atraumatic. Ears: B TMs ok, no erythema or effusion; Nose: Nose normal. Mouth/Throat: Oropharynx is clear and moist. No oropharyngeal exudate.  Eyes: Conjunctivae and EOM are normal. Pupils are equal, round, and reactive to light. No scleral icterus.  Neck: Normal range of motion. Neck supple. No JVD present. No thyromegaly present.  Cardiovascular: Normal rate, regular rhythm and normal heart sounds.  No murmur heard. No BLE edema. Pulmonary/Chest: Effort normal and breath sounds normal. No respiratory distress. Abdominal: Soft. Bowel sounds are normal, no distension. There is no tenderness. no masses Breast: no lumps or masses, no nipple discharge or rashes FEMALE GENITALIA:  Not done  RECTAL: not done  Musculoskeletal: Normal range of motion, no joint effusions. No gross deformities Neurological: he is alert and oriented to person, place, and time. No cranial nerve deficit. Coordination, balance, strength, speech and gait are normal.  Skin: Skin is warm and dry. No rash noted. No erythema.  Psychiatric: Patient has a normal mood and affect. behavior is normal. Judgment and thought content normal.     Assessment & Plan  1. Well adult exam (Primary)   2. Need for influenza vaccination  - Flu vaccine HIGH DOSE PF(Fluzone Trivalent)  3. Osteoporosis, post-menopausal  She wants to read about treatment again  4. Need for pneumococcal 20-valent conjugate vaccination   She will return for that   -USPSTF grade A and B recommendations reviewed with patient; age-appropriate recommendations, preventive care, screening tests, etc discussed and encouraged; healthy living encouraged; see AVS for patient education given to patient -Discussed importance of 150 minutes of physical activity weekly, eat two servings of fish weekly, eat one serving of tree nuts ( cashews,  pistachios, pecans, almonds.SABRA) every other day, eat 6 servings of fruit/vegetables daily and drink plenty of water and avoid sweet beverages.   -Reviewed Health Maintenance: Yes.

## 2024-08-31 ENCOUNTER — Encounter: Payer: Self-pay | Admitting: Internal Medicine

## 2024-09-13 ENCOUNTER — Ambulatory Visit: Admitting: Anesthesiology

## 2024-09-13 ENCOUNTER — Encounter
Admission: RE | Disposition: A | Discharge status: Home / Self Care | Payer: Self-pay | Source: Home / Self Care | Attending: Internal Medicine

## 2024-09-13 ENCOUNTER — Encounter: Payer: Self-pay | Admitting: Internal Medicine

## 2024-09-13 ENCOUNTER — Ambulatory Visit
Admission: RE | Admit: 2024-09-13 | Discharge: 2024-09-13 | Disposition: A | Discharge status: Home / Self Care | Attending: Internal Medicine | Admitting: Internal Medicine

## 2024-09-13 DIAGNOSIS — E785 Hyperlipidemia, unspecified: Secondary | ICD-10-CM | POA: Diagnosis not present

## 2024-09-13 DIAGNOSIS — K640 First degree hemorrhoids: Secondary | ICD-10-CM | POA: Insufficient documentation

## 2024-09-13 DIAGNOSIS — Z87891 Personal history of nicotine dependence: Secondary | ICD-10-CM | POA: Insufficient documentation

## 2024-09-13 DIAGNOSIS — K648 Other hemorrhoids: Secondary | ICD-10-CM | POA: Diagnosis not present

## 2024-09-13 DIAGNOSIS — Z800 Family history of malignant neoplasm of digestive organs: Secondary | ICD-10-CM | POA: Insufficient documentation

## 2024-09-13 DIAGNOSIS — Z1211 Encounter for screening for malignant neoplasm of colon: Secondary | ICD-10-CM | POA: Diagnosis not present

## 2024-09-13 SURGERY — COLONOSCOPY
Anesthesia: General

## 2024-09-13 MED ORDER — PROPOFOL 10 MG/ML IV BOLUS
INTRAVENOUS | Status: DC | PRN
  Administered 2024-09-13: 100 mg via INTRAVENOUS

## 2024-09-13 MED ORDER — PROPOFOL 1000 MG/100ML IV EMUL
INTRAVENOUS | Status: AC
  Filled 2024-09-13: qty 100

## 2024-09-13 MED ORDER — PROPOFOL 500 MG/50ML IV EMUL
INTRAVENOUS | Status: DC | PRN
  Administered 2024-09-13: 150 ug/kg/min via INTRAVENOUS

## 2024-09-13 MED ORDER — SODIUM CHLORIDE 0.9 % IV SOLN
INTRAVENOUS | Status: DC
  Administered 2024-09-13: 500 mL via INTRAVENOUS

## 2024-09-13 NOTE — H&P (Signed)
 Outpatient short stay form Pre-procedure 09/13/2024 11:09 AM Denise Perez, M.D.  Primary Physician: Dorette Loron, M.D.  Reason for visit:  Colon cancer screening  History of present illness:  Patient presents for colonoscopy for colon cancer screening. The patient denies complaints of abdominal pain, significant change in bowel habits, or rectal bleeding.      Current Facility-Administered Medications:    0.9 %  sodium chloride  infusion, , Intravenous, Continuous, [place], Humzah Harty K, MD, Last Rate: 20 mL/hr at 09/13/24 1048, 500 mL at 09/13/24 1048  Medications Prior to Admission  Medication Sig Dispense Refill Last Dose/Taking   Cholecalciferol (VITAMIN D ) 2000 units CAPS Take 1 capsule (2,000 Units total) by mouth daily. 30 capsule 0 Past Week   MULTIPLE VITAMIN PO Take by mouth. ZINC/CALCIUM /D3   Past Week     Allergies  Allergen Reactions   Prednisone Other (See Comments)    Shaking     Past Medical History:  Diagnosis Date   Cervical disc disease    Cervical neuropathy    GERD (gastroesophageal reflux disease)    Osteoporosis    Tietze's disease     Review of systems:  Otherwise negative.    Physical Exam  Gen: Alert, oriented. Appears stated age.  HEENT: [place]/AT. PERRLA. Lungs: CTA, no wheezes. CV: RR nl S1, S2. Abd: soft, benign, no masses. BS+ Ext: No edema. Pulses 2+    Planned procedures: Proceed with colonoscopy. The patient understands the nature of the planned procedure, indications, risks, alternatives and potential complications including but not limited to bleeding, infection, perforation, damage to internal organs and possible oversedation/side effects from anesthesia. The patient agrees and gives consent to proceed.  Please refer to procedure notes for findings, recommendations and patient disposition/instructions.     Annalisa Colonna K. Perez, M.D. Gastroenterology 09/13/2024  11:09 AM

## 2024-09-13 NOTE — Transfer of Care (Signed)
 Immediate Anesthesia Transfer of Care Note  Patient: Denise Perez  Procedure(s) Performed: COLONOSCOPY  Patient Location: PACU  Anesthesia Type:General  Level of Consciousness: awake and sedated  Airway & Oxygen Therapy: Patient Spontanous Breathing and Patient connected to nasal cannula oxygen  Post-op Assessment: Report given to RN and Post -op Vital signs reviewed and stable  Post vital signs: Reviewed and stable  Last Vitals:  Vitals Value Taken Time  BP    Temp    Pulse    Resp    SpO2      Last Pain:  Vitals:   09/13/24 1037  TempSrc: Temporal  PainSc: 0-No pain         Complications: There were no known notable events for this encounter.

## 2024-09-13 NOTE — Anesthesia Preprocedure Evaluation (Signed)
 Anesthesia Evaluation  Patient identified by MRN, date of birth, ID band Patient awake    Reviewed: Allergy & Precautions, NPO status , Patient's Chart, lab work & pertinent test results  History of Anesthesia Complications Negative for: history of anesthetic complications  Airway Mallampati: II  TM Distance: >3 FB Neck ROM: Full    Dental  (+) Partial Upper, Dental Advidsory Given   Pulmonary neg pulmonary ROS, neg shortness of breath, neg sleep apnea, neg COPD, neg recent URI, former smoker   breath sounds clear to auscultation- rhonchi (-) wheezing      Cardiovascular Exercise Tolerance: Good (-) hypertension(-) angina (-) CAD, (-) Past MI, (-) Cardiac Stents and (-) CABG  Rhythm:Regular Rate:Normal - Systolic murmurs and - Diastolic murmurs    Neuro/Psych neg Seizures negative neurological ROS  negative psych ROS   GI/Hepatic Neg liver ROS,GERD  ,,  Endo/Other  negative endocrine ROSneg diabetes    Renal/GU negative Renal ROS     Musculoskeletal  (+) Arthritis ,    Abdominal  (+) - obese  Peds  Hematology negative hematology ROS (+)   Anesthesia Other Findings Past Medical History: No date: Cervical disc disease No date: Cervical neuropathy No date: GERD (gastroesophageal reflux disease) No date: Osteoporosis 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   Reproductive/Obstetrics                              Anesthesia Physical Anesthesia Plan  ASA: 2  Anesthesia Plan: General   Post-op Pain Management:    Induction: Intravenous  PONV Risk Score and Plan: 2 and Propofol  infusion and TIVA  Airway Management Planned: Natural Airway and Nasal Cannula  Additional Equipment:   Intra-op Plan:   Post-operative Plan:   Informed Consent: I have reviewed the patients History and Physical, chart, labs and discussed the procedure including the risks, benefits and alternatives for the proposed  anesthesia with the patient or authorized representative who has indicated his/her understanding and acceptance.     Dental advisory given  Plan Discussed with: CRNA and Anesthesiologist  Anesthesia Plan Comments:          Anesthesia Quick Evaluation

## 2024-09-13 NOTE — Op Note (Signed)
 [facility] Gastroenterology Patient Name: Denise Perez Procedure Date: 09/13/2024 11:19 AM MRN: [id number] Account #: [id number] Date of Birth: 1950-12-30 Admit Type: Outpatient Age: 73 Room: [facility] ENDO ROOM 1 Gender: Female Note Status: Finalized Instrument Name: Colon Scope [id number] Procedure:             Colonoscopy Indications:           Screening in patient at increased risk: Family history                         of 1st-degree relative with colorectal cancer Providers:             Jahlil Ziller K. Aundria MD, MD Referring MD:          Dorette FALCON. Sowles, MD (Referring MD) Medicines:             Propofol  per Anesthesia Complications:         No immediate complications. Estimated blood loss: None. Procedure:             Pre-Anesthesia Assessment:                        - The risks and benefits of the procedure and the                         sedation options and risks were discussed with the                         patient. All questions were answered and informed                         consent was obtained.                        - Patient identification and proposed procedure were                         verified prior to the procedure by the nurse. The                         procedure was verified in the procedure room.                        - ASA Grade Assessment: III - A patient with severe                         systemic disease.                        - After reviewing the risks and benefits, the patient                         was deemed in satisfactory condition to undergo the                         procedure.                        After obtaining informed consent, the colonoscope was  passed under direct vision. Throughout the procedure,                         the patient's blood pressure, pulse, and oxygen                         saturations were monitored continuously. The                         Colonoscope was introduced  through the anus and                         advanced to the the cecum, identified by appendiceal                         orifice and ileocecal valve. The colonoscopy was                         performed without difficulty. The patient tolerated                         the procedure well. The quality of the bowel                         preparation was good. The ileocecal valve, appendiceal                         orifice, and rectum were photographed. Findings:      The perianal and digital rectal examinations were normal. Pertinent       negatives include normal sphincter tone and no palpable rectal lesions.      Non-bleeding internal hemorrhoids were found during retroflexion. The       hemorrhoids were Grade I (internal hemorrhoids that do not prolapse).      The exam was otherwise without abnormality. Impression:            - Non-bleeding internal hemorrhoids.                        - The examination was otherwise normal.                        - No specimens collected. Recommendation:        - Patient has a contact number available for                         emergencies. The signs and symptoms of potential                         delayed complications were discussed with the patient.                         Return to normal activities tomorrow. Written                         discharge instructions were provided to the patient.                        - Resume previous diet.                        -  Continue present medications.                        - Repeat colonoscopy in 5 years for screening purposes.                        - Return to GI office PRN.                        - The findings and recommendations were discussed with                         the patient. Procedure Code(s):     --- Professional ---                        H9894, Colorectal cancer screening; colonoscopy on                         individual at high risk Diagnosis Code(s):     --- Professional ---                         K64.0, First degree hemorrhoids                        Z80.0, Family history of malignant neoplasm of                         digestive organs CPT copyright 2022 American Medical Association. All rights reserved. The codes documented in this report are preliminary and upon coder review may  be revised to meet current compliance requirements. [name] MD, MD 09/13/2024 11:40:37 AM This report has been signed electronically. Number of Addenda: 0 Note Initiated On: 09/13/2024 11:19 AM Scope Withdrawal Time: 0 hours 4 minutes 2 seconds  Total Procedure Duration: 0 hours 8 minutes 58 seconds  Estimated Blood Loss:  Estimated blood loss: none.      [facility]

## 2024-09-14 ENCOUNTER — Encounter: Payer: Self-pay | Admitting: Internal Medicine

## 2024-09-14 NOTE — Anesthesia Postprocedure Evaluation (Signed)
 Anesthesia Post Note  Patient: Denise Perez  Procedure(s) Performed: COLONOSCOPY  Patient location during evaluation: Endoscopy Anesthesia Type: General Level of consciousness: awake and alert Pain management: pain level controlled Vital Signs Assessment: post-procedure vital signs reviewed and stable Respiratory status: spontaneous breathing, nonlabored ventilation, respiratory function stable and patient connected to nasal cannula oxygen Cardiovascular status: blood pressure returned to baseline and stable Postop Assessment: no apparent nausea or vomiting Anesthetic complications: no   There were no known notable events for this encounter.   Last Vitals:  Vitals:   09/13/24 1152 09/13/24 1202  BP: 98/63 111/64  Pulse: (!) 58 (!) 50  Resp: 16 14  Temp:    SpO2: 100% 100%    Last Pain:  Vitals:   09/13/24 1202  TempSrc:   PainSc: 0-No pain                 Prentice Murphy

## 2024-09-21 ENCOUNTER — Encounter: Payer: Self-pay | Admitting: Family Medicine

## 2024-09-21 ENCOUNTER — Ambulatory Visit (INDEPENDENT_AMBULATORY_CARE_PROVIDER_SITE_OTHER): Admitting: Family Medicine

## 2024-09-21 VITALS — BP 110/74 | HR 86 | Temp 98.0°F | Resp 16 | Ht 66.0 in | Wt 137.3 lb

## 2024-09-21 DIAGNOSIS — H9202 Otalgia, left ear: Secondary | ICD-10-CM | POA: Diagnosis not present

## 2024-09-21 DIAGNOSIS — R053 Chronic cough: Secondary | ICD-10-CM

## 2024-09-21 DIAGNOSIS — J3089 Other allergic rhinitis: Secondary | ICD-10-CM | POA: Insufficient documentation

## 2024-09-21 DIAGNOSIS — Z23 Encounter for immunization: Secondary | ICD-10-CM

## 2024-09-21 DIAGNOSIS — R911 Solitary pulmonary nodule: Secondary | ICD-10-CM

## 2024-09-21 NOTE — Progress Notes (Signed)
 Name: Denise Perez   MRN: [id number]    DOB: [birth year]   Date:09/21/2024       Progress Note  Subjective  Chief Complaint  Chief Complaint  Patient presents with   Ear Pain    L ear comes and goes, ongoing for a week, not consistent    Discussed the use of AI scribe software for clinical note transcription with the patient, who gave verbal consent to proceed.  History of Present Illness Denise Perez is a 73 year old female who presents with intermittent left ear pain.  She experiences intermittent sharp pain in her left ear, which occurs when she manipulates her ear, such as when addressing an itch. The pain is not present at the time of the visit and does not occur spontaneously. There is no radiation of the pain to other areas. She reports no fever, chills, or recent cold symptoms.  She has chronic sinus issues, including year-round sinus congestion and a morning cough, which she attributes to overnight drainage. The cough resolves after clearing her throat in the morning. She has not been taking any medications for her sinus issues or cough and denies having asthma or reactive airway disease.  Her past medical history includes a history of COVID with a small lung nodule noted on CT done end of 2022. She has been under stress recently due to her husband's medical procedures.  She is a former smoker, having quit in 1987, denies SOB    Patient Active Problem List   Diagnosis Date Noted   Adhesive capsulitis of shoulder 08/19/2024   Osteoporosis, post-menopausal 08/19/2024   Closed fracture of proximal phalanx of little finger 06/07/2024   Vitamin D  deficiency 05/09/2022   COVID-19 long hauler 05/09/2022   Atherosclerosis of aorta 05/07/2020   FH: colon cancer 05/30/2019   Dyslipidemia 05/28/2016   Cervical disc disease 05/28/2016   Primary osteoarthritis involving multiple joints 06/11/2015   Synovial cyst of wrist 06/11/2015    Social History   Tobacco Use    Smoking status: Former    Current packs/day: 0.00    Average packs/day: 1.5 packs/day for 15.0 years (22.5 ttl pk-yrs)    Types: Cigarettes    Start date: 11/17/1970    Quit date: 11/17/1985    Years since quitting: 38.8   Smokeless tobacco: Never   Tobacco comments:    smoking cessation materials not required  Substance Use Topics   Alcohol use: Not Currently    Alcohol/week: 0.0 standard drinks of alcohol    Comment: 5 glasses of wine a year     Current Outpatient Medications:    Cholecalciferol (VITAMIN D ) 2000 units CAPS, Take 1 capsule (2,000 Units total) by mouth daily., Disp: 30 capsule, Rfl: 0   MULTIPLE VITAMIN PO, Take by mouth. ZINC/CALCIUM /D3, Disp: , Rfl:   Allergies  Allergen Reactions   Prednisone Other (See Comments)    Shaking    ROS  Ten systems reviewed and is negative except as mentioned in HPI    Objective  Vitals:   09/21/24 0750  BP: 110/74  Pulse: 86  Resp: 16  SpO2: 98%  Weight: 137 lb 4.8 oz (62.3 kg)  Height: 5' 6 (1.676 m)    Body mass index is 22.16 kg/m.    Physical Exam CONSTITUTIONAL: Patient appears well-developed and well-nourished. No distress. HEENT: Head atraumatic, normocephalic, neck supple. Left ear with a small red spot on superior ear canal but non-tender, otherwise normal. Throat and oral cavity  normal. CARDIOVASCULAR: Normal rate, regular rhythm and normal heart sounds. No murmur heard. No BLE edema. PULMONARY: Effort normal and breath sounds normal. No respiratory distress. ABDOMINAL: There is no tenderness or distention. MUSCULOSKELETAL: Normal gait. Without gross motor or sensory deficit. PSYCHIATRIC: Patient has a normal mood and affect. Behavior is normal. Judgment and thought content normal.   Assessment & Plan Left ear pain Intermittent sharp left ear pain, possibly TMJ-related due to stress and teeth grinding. Differential includes external ear inflammation or TMJ-related pain. - Administer Tylenol twice  daily for pain management. - Consider ear drops if external ear inflammation is suspected. - Refer to ENT if symptoms persists   Chronic cough and solitary pulmonary nodule Chronic morning cough possibly due to post-bronchial irritation from past COVID infection. Solitary pulmonary nodule identified in October 2022, low risk for malignancy. - Offered CT scan of the chest for further evaluation of the pulmonary nodule since she also has a cough but she would like to hold off for now  General Health Maintenance Discussed pneumonia vaccination with no contraindications. - Administered pneumonia vaccination. PCV 20

## 2024-09-21 NOTE — Patient Instructions (Signed)
 Temporomandibular Joint Syndrome  Temporomandibular joint syndrome (TMJ syndrome) is a condition that causes pain in the temporomandibular joints. These joints are located near your ears and allow your jaw to open and close. For people with TMJ syndrome, chewing, biting, or other movements of the jaw can be difficult or painful. TMJ syndrome is often mild and goes away within a few weeks. However, sometimes the condition becomes a long-term (chronic) problem. What are the causes? This condition may be caused by: Grinding your teeth or clenching your jaw. Some people do this when they are stressed. Arthritis. An injury to the jaw. A head or neck injury. Teeth or dentures that are not aligned well. In some cases, the cause of TMJ syndrome may not be known. What are the signs or symptoms? The most common symptom of this condition is aching pain on the side of the head in the area of the TMJ. Other symptoms may include: Pain when moving your jaw, such as when chewing or biting. Not being able to open your jaw all the way. Making a clicking sound when you open your mouth. Headache. Earache. Neck or shoulder pain. How is this diagnosed? This condition may be diagnosed based on: Your symptoms and medical history. A physical exam. Your health care provider may check the range of motion of your jaw. Imaging tests, such as X-rays or an MRI. You may also need to see your dentist, who will check if your teeth and jaw are lined up correctly. How is this treated? TMJ syndrome often goes away on its own. If treatment is needed, it may include: Eating soft foods and applying ice or heat. Medicines to relieve pain or inflammation. Medicines or massage to relax the muscles. A splint, bite plate, or mouthpiece to prevent teeth grinding or jaw clenching. Relaxation techniques or counseling to help reduce stress. A therapy for pain in which an electrical current is applied to the nerves through the skin  (transcutaneous electrical nerve stimulation). Acupuncture. This may help to relieve pain. Jaw surgery. This is rarely needed. Follow these instructions at home:  Eating and drinking Eat a soft diet if you are having trouble chewing. Avoid foods that require a lot of chewing. Do not chew gum. General instructions Take over-the-counter and prescription medicines only as told by your health care provider. If directed, put ice on the painful area. To do this: Put ice in a plastic bag. Place a towel between your skin and the bag. Leave the ice on for 20 minutes, 2-3 times a day. Remove the ice if your skin turns bright red. This is very important. If you cannot feel pain, heat, or cold, you have a greater risk of damage to the area. Apply a warm, wet cloth (warm compress) to the painful area as told. Massage your jaw area and do any jaw stretching exercises as told by your health care provider. If you were given a splint, bite plate, or mouthpiece, wear it as told by your health care provider. Keep all follow-up visits. This is important. Where to find more information [employer]: [employer] Contact a health care provider if: You have trouble eating. You have new or worsening symptoms. Get help right away if: Your jaw locks. Summary Temporomandibular joint syndrome (TMJ syndrome) is a condition that causes pain in the temporomandibular joints. These joints are located near your ears and allow your jaw to open and close. TMJ syndrome is often mild and goes away within  a few weeks. However, sometimes the condition becomes a long-term (chronic) problem. Symptoms include an aching pain on the side of the head in the area of the TMJ, pain when chewing or biting, and being unable to open your jaw all the way. You may also make a clicking sound when you open your mouth. TMJ syndrome often goes away on its own. If treatment is needed, it may  include medicines to relieve pain, reduce inflammation, or relax the muscles. A splint, bite plate, or mouthpiece may also be used to prevent teeth grinding or jaw clenching. This information is not intended to replace advice given to you by your health care provider. Make sure you discuss any questions you have with your health care provider. Document Revised: 06/16/2021 Document Reviewed: 06/16/2021 Elsevier Patient Education  2024 [employer].

## 2024-09-22 DIAGNOSIS — D225 Melanocytic nevi of trunk: Secondary | ICD-10-CM | POA: Diagnosis not present

## 2024-09-22 DIAGNOSIS — L821 Other seborrheic keratosis: Secondary | ICD-10-CM | POA: Diagnosis not present

## 2024-09-22 DIAGNOSIS — D2262 Melanocytic nevi of left upper limb, including shoulder: Secondary | ICD-10-CM | POA: Diagnosis not present

## 2024-09-22 DIAGNOSIS — D2271 Melanocytic nevi of right lower limb, including hip: Secondary | ICD-10-CM | POA: Diagnosis not present

## 2024-09-22 DIAGNOSIS — D2272 Melanocytic nevi of left lower limb, including hip: Secondary | ICD-10-CM | POA: Diagnosis not present

## 2024-09-22 DIAGNOSIS — D2261 Melanocytic nevi of right upper limb, including shoulder: Secondary | ICD-10-CM | POA: Diagnosis not present

## 2024-10-04 ENCOUNTER — Ambulatory Visit: Payer: Self-pay | Admitting: *Deleted

## 2024-10-04 NOTE — Telephone Encounter (Signed)
 FYI Only or Action Required?: FYI only for provider: appointment scheduled on 10/05/24.  Patient was last seen in primary care on 09/21/2024 by Glenard Mire, MD.  Called Nurse Triage reporting Facial Pain.  Symptoms began several days ago. 5 days ago   Interventions attempted: OTC medications: antihistamines, tylenol, aspirin  and Rest, hydration, or home remedies.  Symptoms are: gradually worsening.  Triage Disposition: See PCP When Office is Open (Within 3 Days)  Patient/caregiver understands and will follow disposition?: Yes    Patient reports blowing nose for small amount of blood in mornings upon awakening x 2 days since sx started and constant coughing getting worse.          Copied from CRM #[id number]. Topic: Clinical - Red Word Triage >> Oct 04, 2024  7:35 AM Darshell M wrote: Red Word that prompted transfer to Nurse Triage: Cold, cough, laryngitis, headaches, tenderness in sinus area, blood when she blows her nose in the morning for the last 2 days. No fever. Has been ill for the last 5 days. Reason for Disposition  Lots of coughing  Answer Assessment - Initial Assessment Questions Appt scheduled tomorrow with other provider none available with PCP. Patient unable to do VV, does not know how.      1. LOCATION: Where does it hurt?      Sinus area side of nose 2. ONSET: When did the sinus pain start?  (e.g., hours, days)      5 days  3. SEVERITY: How bad is the pain?   (Scale 0-10; or none, mild, moderate or severe)     Mild to moderate 4. RECURRENT SYMPTOM: Have you ever had sinus problems before? If Yes, ask: When was the last time? and What happened that time?      Yes dx as allergies 5. NASAL CONGESTION: Is the nose blocked? If Yes, ask: Can you open it or must you breathe through your mouth?     No  6. NASAL DISCHARGE: Do you have discharge from your nose? If so ask, What color?     Yes clear to  7. FEVER: Do you have a fever? If  Yes, ask: What is it, how was it measured, and when did it start?      no 8. OTHER SYMPTOMS: Do you have any other symptoms? (e.g., sore throat, cough, earache, difficulty breathing)     Headache cough some mucus,milky,  persistent at times, laryngitis, blowing nose blood streaks in the mornings.  9. PREGNANCY: Is there any chance you are pregnant? When was your last menstrual period?     na  Protocols used: Sinus Pain or Congestion-A-AH

## 2024-10-05 ENCOUNTER — Encounter: Payer: Self-pay | Admitting: Internal Medicine

## 2024-10-05 ENCOUNTER — Ambulatory Visit: Admitting: Internal Medicine

## 2024-10-05 ENCOUNTER — Other Ambulatory Visit: Payer: Self-pay

## 2024-10-05 VITALS — BP 110/72 | HR 97 | Temp 98.4°F | Resp 16 | Ht 66.0 in | Wt 138.6 lb

## 2024-10-05 DIAGNOSIS — J029 Acute pharyngitis, unspecified: Secondary | ICD-10-CM

## 2024-10-05 DIAGNOSIS — J069 Acute upper respiratory infection, unspecified: Secondary | ICD-10-CM

## 2024-10-05 DIAGNOSIS — R051 Acute cough: Secondary | ICD-10-CM

## 2024-10-05 LAB — POC COVID19/FLU A&B COMBO
Covid Antigen, POC: NEGATIVE
Influenza A Antigen, POC: NEGATIVE
Influenza B Antigen, POC: NEGATIVE

## 2024-10-05 LAB — POCT RAPID STREP A (OFFICE): Rapid Strep A Screen: NEGATIVE

## 2024-10-05 MED ORDER — BENZONATATE 100 MG PO CAPS: 100.0000 mg | ORAL_CAPSULE | Freq: Two times a day (BID) | ORAL | 0 refills | Status: AC | PRN

## 2024-10-05 NOTE — Progress Notes (Signed)
 Acute Office Visit  Subjective:     Patient ID: Denise Perez, female    DOB: 1951-08-25, 73 y.o.   MRN: [id number]  Chief Complaint  Patient presents with   Sore Throat    Cough, congestion, no fever for 6 days    Sore Throat  Associated symptoms include congestion and coughing. Pertinent negatives include no ear pain or shortness of breath.   Patient is in today for cough, congestion and no fever x 6 days.   Discussed the use of AI scribe software for clinical note transcription with the patient, who gave verbal consent to proceed.  History of Present Illness Denise Perez is a 73 year old female who presents with cough and congestion for six days.  She has experienced a cough and congestion for six days without fever. Her temperature peaked at 99.37F over the weekend but is now around 937F. Initially, she had a sore throat for two days. She has noticed blood in her nasal mucus upon waking for three days, which resolves later in the day. She denies using nasal sprays or neti pots. Initial sinus pain has resolved, but she now feels a burning sensation in the morning.  Her cough is mostly dry, with occasional rattling and off-white sputum production. She experiences shortness of breath after exertion, such as climbing stairs, but denies wheezing. She does not take any regular medications and recently received a pneumonia and flu shot during her annual check-up.   Review of Systems  Constitutional:  Negative for chills and fever.  HENT:  Positive for congestion and sore throat. Negative for ear pain and sinus pain.   Respiratory:  Positive for cough. Negative for sputum production, shortness of breath and wheezing.         Objective:    BP 110/72 (Cuff Size: Large)   Pulse 97   Temp 98.4 F (36.9 C) (Oral)   Resp 16   Ht 5' 6 (1.676 m)   Wt 138 lb 9.6 oz (62.9 kg)   SpO2 98%   BMI 22.37 kg/m  BP Readings from Last 3 Encounters:  10/05/24 110/72  09/21/24  110/74  09/13/24 111/64   Wt Readings from Last 3 Encounters:  10/05/24 138 lb 9.6 oz (62.9 kg)  09/21/24 137 lb 4.8 oz (62.3 kg)  09/13/24 136 lb (61.7 kg)      Physical Exam Constitutional:      Appearance: Normal appearance.  HENT:     Head: Normocephalic and atraumatic.     Right Ear: Tympanic membrane, ear canal and external ear normal.     Left Ear: Tympanic membrane, ear canal and external ear normal.     Nose: Congestion present.     Mouth/Throat:     Mouth: Mucous membranes are moist.     Pharynx: Posterior oropharyngeal erythema present.  Eyes:     Conjunctiva/sclera: Conjunctivae normal.  Cardiovascular:     Rate and Rhythm: Normal rate and regular rhythm.  Pulmonary:     Effort: Pulmonary effort is normal.     Breath sounds: Normal breath sounds. No wheezing, rhonchi or rales.  Skin:    General: Skin is warm and dry.  Neurological:     General: No focal deficit present.     Mental Status: She is alert. Mental status is at baseline.  Psychiatric:        Mood and Affect: Mood normal.        Behavior: Behavior normal.  Results for orders placed or performed in visit on 10/05/24  POC Covid19/Flu A&B Antigen  Result Value Ref Range   Influenza A Antigen, POC Negative Negative   Influenza B Antigen, POC Negative Negative   Covid Antigen, POC Negative Negative  POCT rapid strep A  Result Value Ref Range   Rapid Strep A Screen Negative Negative        Assessment & Plan:   Assessment and Plan Assessment & Plan Acute viral upper respiratory infection with acute cough Likely viral etiology, possibly parainfluenza, adenovirus, or rhinovirus. Symptoms self-limiting, expected resolution in 10-14 days. - POC flu, COVID and strep negative.  - Prescribed Tessalon  Perles for cough suppression, especially at night. - Encouraged hydration and rest. - Recommended vitamin C or zinc to boost immune system. - Instructed to monitor for worsening symptoms such as  one-sided sinus pain, fever, or productive cough, and to contact clinic if these occur. - Provided educational materials on viral illnesses.  - POC Covid19/Flu A&B Antigen - POCT rapid strep A - benzonatate  (TESSALON ) 100 MG capsule; Take 1 capsule (100 mg total) by mouth 2 (two) times daily as needed for cough.  Dispense: 20 capsule; Refill: 0   Return if symptoms worsen or fail to improve.  Sharyle Fischer, DO

## 2024-10-05 NOTE — Patient Instructions (Signed)
 Viral Respiratory Infection A respiratory infection is an illness that affects part of the respiratory system, such as the lungs, nose, or throat. A respiratory infection that is caused by a virus is called a viral respiratory infection. Common types of viral respiratory infections include: A cold. The flu (influenza). A respiratory syncytial virus (RSV) infection. What are the causes? This condition is caused by a virus. The virus may spread through contact with droplets or direct contact with infected people or their mucus or secretions. The virus may spread from person to person (is contagious). What are the signs or symptoms? Symptoms of this condition include: A stuffy or runny nose. A sore throat or cough. Shortness of breath or difficulty breathing. Yellow or green mucus (sputum). Other symptoms may include: A fever. Sweating or chills. Fatigue. Achy muscles. A headache. How is this diagnosed? This condition may be diagnosed based on: Your symptoms. A physical exam. Testing of secretions from the nose or throat. Chest X-ray. How is this treated? This condition may be treated with medicines, such as: Antiviral medicine. This may shorten the length of time a person has symptoms. Expectorants. These make it easier to cough up mucus. Decongestant nasal sprays. Acetaminophen or NSAIDs, such as ibuprofen, to relieve fever and pain. Antibiotic medicines are not prescribed for viral infections.This is because antibiotics are designed to kill bacteria. They do not kill viruses. Follow these instructions at home: Managing pain and congestion Take over-the-counter and prescription medicines only as told by your health care provider. If you have a sore throat, gargle with a mixture of salt and water 3-4 times a day or as needed. To make salt water, completely dissolve -1 tsp (3-6 g) of salt in 1 cup (237 mL) of warm water. Use nose drops made from salt water to ease congestion and  soften raw skin around your nose. Take 2 tsp (10 mL) of honey at bedtime to lessen coughing at night. Do not give honey to children who are younger than 1 year. Drink enough fluid to keep your urine pale yellow. This helps prevent dehydration and helps loosen up mucus. General instructions  Rest as much as possible. Do not drink alcohol. Do not use any products that contain nicotine or tobacco. These products include cigarettes, chewing tobacco, and vaping devices, such as e-cigarettes. If you need help quitting, ask your health care provider. Keep all follow-up visits. This is important. How is this prevented?     Get an annual flu shot. You may get the flu shot in late summer, fall, or winter. Ask your health care provider when you should get your flu shot. Avoid spreading your infection to other people. If you are sick: Wash your hands with soap and water often, especially after you cough or sneeze. Wash for at least 20 seconds. If soap and water are not available, use alcohol-based hand sanitizer. Cover your mouth when you cough. Cover your nose and mouth when you sneeze. Do not share cups or eating utensils. Clean commonly used objects often. Clean commonly touched surfaces. Stay home from work or school as told by your health care provider. Avoid contact with people who are sick during cold and flu season. This is generally fall and winter. Contact a health care provider if: Your symptoms last for 10 days or longer. Your symptoms get worse over time. You have severe sinus pain in your face or forehead. The glands in your jaw or neck become very swollen. You have shortness of breath. Get  help right away if you: Feel pain or pressure in your chest. Have trouble breathing. Faint or feel like you will faint. Have severe and persistent vomiting. Feel confused or disoriented. These symptoms may represent a serious problem that is an emergency. Do not wait to see if the symptoms will  go away. Get medical help right away. Call your local emergency services (911 in the [place] Do not drive yourself to the hospital. Summary A respiratory infection is an illness that affects part of the respiratory system, such as the lungs, nose, or throat. A respiratory infection that is caused by a virus is called a viral respiratory infection. Common types of viral respiratory infections include a cold, influenza, and respiratory syncytial virus (RSV) infection. Symptoms of this condition include a stuffy or runny nose, cough, fatigue, achy muscles, sore throat, and fevers or chills. Antibiotic medicines are not prescribed for viral infections. This is because antibiotics are designed to kill bacteria. They are not effective against viruses. This information is not intended to replace advice given to you by your health care provider. Make sure you discuss any questions you have with your health care provider. Document Revised: 02/07/2021 Document Reviewed: 02/07/2021 Elsevier Patient Education  2024 [employer].

## 2024-10-26 ENCOUNTER — Ambulatory Visit

## 2024-10-26 NOTE — Telephone Encounter (Unsigned)
 Copied from CRM #[id number]. Topic: Appointments - Appointment Scheduling >> Oct 26, 2024  3:27 PM Victoria B wrote: Patient needs to reschedule AWV . Please cb

## 2024-12-02 ENCOUNTER — Ambulatory Visit

## 2024-12-09 ENCOUNTER — Ambulatory Visit (INDEPENDENT_AMBULATORY_CARE_PROVIDER_SITE_OTHER)

## 2024-12-09 VITALS — BP 112/72 | Ht 66.0 in | Wt 139.1 lb

## 2024-12-09 DIAGNOSIS — Z0000 Encounter for general adult medical examination without abnormal findings: Secondary | ICD-10-CM

## 2024-12-09 NOTE — Progress Notes (Signed)
 "  Chief Complaint  Patient presents with   Medicare Wellness     Subjective:   Denise Perez is a 74 y.o. female who presents for a Medicare Annual Wellness Visit.  Visit info / Clinical Intake: Medicare Wellness Visit Type:: Subsequent Annual Wellness Visit Persons participating in visit and providing information:: patient Medicare Wellness Visit Mode:: In-person (required for WTM) Interpreter Needed?: No Pre-visit prep was completed: yes AWV questionnaire completed by patient prior to visit?: no Living arrangements:: lives with spouse/significant other Patient's Overall Health Status Rating: very good Typical amount of pain: none Does pain affect daily life?: no Are you currently prescribed opioids?: no  Dietary Habits and Nutritional Risks How many meals a day?: 2 Eats fruit and vegetables daily?: yes Most meals are obtained by: preparing own meals In the last 2 weeks, have you had any of the following?: none Diabetic:: no  Functional Status Activities of Daily Living (to include ambulation/medication): Independent Ambulation: Independent Medication Administration: Independent Home Management (perform basic housework or laundry): Independent Manage your own finances?: yes Primary transportation is: driving Concerns about vision?: no *vision screening is required for WTM* (GLASSES OCCASIONALLY- DR.BULAKOWSKI-YEARLY) Concerns about hearing?: no  Fall Screening Falls in the past year?: 0 Number of falls in past year: 0 Was there an injury with Fall?: 0 Fall Risk Category Calculator: 0 Patient Fall Risk Level: Low Fall Risk  Fall Risk Patient at Risk for Falls Due to: No Fall Risks Fall risk Follow up: Falls evaluation completed; Falls prevention discussed  Home and Transportation Safety: All rugs have non-skid backing?: yes All stairs or steps have railings?: yes Grab bars in the bathtub or shower?: yes Have non-skid surface in bathtub or shower?: yes Good  home lighting?: yes Regular seat belt use?: yes Hospital stays in the last year:: no  Cognitive Assessment Difficulty concentrating, remembering, or making decisions? : no Will 6CIT or Mini Cog be Completed: yes What year is it?: 0 points What month is it?: 0 points Give patient an address phrase to remember (5 components): [address] [place], [place] About what time is it?: 0 points Count backwards from 20 to 1: 0 points Say the months of the year in reverse: 0 points Repeat the address phrase from earlier: 0 points 6 CIT Score: 0 points  Advance Directives (For Healthcare) Does Patient Have a Medical Advance Directive?: No Type of Advance Directive: Healthcare Power of [place]; Living will Would patient like information on creating a medical advance directive?: No - Patient declined  Reviewed/Updated  Reviewed/Updated: Reviewed All (Medical, Surgical, Family, Medications, Allergies, Care Teams, Patient Goals)    Allergies (verified) Prednisone   Current Medications (verified) Outpatient Encounter Medications as of 12/09/2024  Medication Sig   Cholecalciferol (VITAMIN D ) 2000 units CAPS Take 1 capsule (2,000 Units total) by mouth daily.   MULTIPLE VITAMIN PO Take by mouth. ZINC/CALCIUM /D3   benzonatate  (TESSALON ) 100 MG capsule Take 1 capsule (100 mg total) by mouth 2 (two) times daily as needed for cough. (Patient not taking: Reported on 12/09/2024)   No facility-administered encounter medications on file as of 12/09/2024.    History: Past Medical History:  Diagnosis Date   Cervical disc disease    Cervical neuropathy    GERD (gastroesophageal reflux disease)    Osteoporosis    Tietze's disease    Past Surgical History:  Procedure Laterality Date   COLONOSCOPY N/A 09/13/2024   Procedure: COLONOSCOPY;  Surgeon: Toledo, Ladell POUR, MD;  Location: [facility] ENDOSCOPY;  Service: Gastroenterology;  Laterality: N/A;   COLONOSCOPY WITH PROPOFOL  N/A 08/13/2015   Procedure:  COLONOSCOPY WITH PROPOFOL ;  Surgeon: Deward CINDERELLA Piedmont, MD;  Location: [facility] ENDOSCOPY;  Service: Gastroenterology;  Laterality: N/A;   COLONOSCOPY WITH PROPOFOL  N/A 06/29/2019   Procedure: COLONOSCOPY WITH PROPOFOL ;  Surgeon: Toledo, Ladell POUR, MD;  Location: [facility] ENDOSCOPY;  Service: Gastroenterology;  Laterality: N/A;   ESOPHAGOGASTRODUODENOSCOPY (EGD) WITH PROPOFOL      ESOPHAGOGASTRODUODENOSCOPY (EGD) WITH PROPOFOL  N/A 06/29/2019   Procedure: ESOPHAGOGASTRODUODENOSCOPY (EGD) WITH PROPOFOL ;  Surgeon: Toledo, Ladell POUR, MD;  Location: [facility] ENDOSCOPY;  Service: Gastroenterology;  Laterality: N/A;   FRACTURE SURGERY     GUM SURGERY     repair of broken little finger Left    TOOTH EXTRACTION     Family History  Problem Relation Age of Onset   Cancer Father        Colon   Breast cancer Neg Hx    Social History   Occupational History   Occupation: Retired  Tobacco Use   Smoking status: Former    Current packs/day: 0.00    Average packs/day: 1.5 packs/day for 15.0 years (22.5 ttl pk-yrs)    Types: Cigarettes    Start date: 11/17/1970    Quit date: 11/17/1985    Years since quitting: 39.0   Smokeless tobacco: Never   Tobacco comments:    smoking cessation materials not required  Vaping Use   Vaping status: Never Used  Substance and Sexual Activity   Alcohol use: Not Currently    Alcohol/week: 0.0 standard drinks of alcohol    Comment: 5 glasses of wine a year   Drug use: No   Sexual activity: Not Currently    Partners: Male   Tobacco Counseling Counseling given: Not Answered Tobacco comments: smoking cessation materials not required  SDOH Screenings   Food Insecurity: No Food Insecurity (12/09/2024)  Housing: Unknown (12/09/2024)  Transportation Needs: No Transportation Needs (12/09/2024)  Utilities: Not At Risk (12/09/2024)  Alcohol Screen: Low Risk (08/19/2024)  Depression (PHQ2-9): Low Risk (12/09/2024)  Financial Resource Strain: Low Risk (08/19/2024)  Physical Activity:  Sufficiently Active (12/09/2024)  Social Connections: Socially Integrated (12/09/2024)  Stress: No Stress Concern Present (12/09/2024)  Tobacco Use: Medium Risk (12/09/2024)  Health Literacy: Adequate Health Literacy (12/09/2024)   See flowsheets for full screening details  Depression Screen PHQ 2 & 9 Depression Scale- Over the past 2 weeks, how often have you been bothered by any of the following problems? Little interest or pleasure in doing things: 0 Feeling down, depressed, or hopeless (PHQ Adolescent also includes...irritable): 0 PHQ-2 Total Score: 0 Trouble falling or staying asleep, or sleeping too much: 0 Feeling tired or having little energy: 0 Poor appetite or overeating (PHQ Adolescent also includes...weight loss): 0 Feeling bad about yourself - or that you are a failure or have let yourself or your family down: 0 Trouble concentrating on things, such as reading the newspaper or watching television (PHQ Adolescent also includes...like school work): 0 Moving or speaking so slowly that other people could have noticed. Or the opposite - being so fidgety or restless that you have been moving around a lot more than usual: 0 Thoughts that you would be better off dead, or of hurting yourself in some way: 0 PHQ-9 Total Score: 0 If you checked off any problems, how difficult have these problems made it for you to do your work, take care of things at home, or get along with other people?: Not difficult at all  Depression  Treatment Depression Interventions/Treatment : PHQ2-9 Score <4 Follow-up Not Indicated     Goals Addressed             This Visit's Progress    DIET - EAT MORE FRUITS AND VEGETABLES               Objective:    Today's Vitals   12/09/24 1454  BP: 112/72  Weight: 139 lb 1.6 oz (63.1 kg)  Height: 5' 6 (1.676 m)   Body mass index is 22.45 kg/m.  Hearing/Vision screen No results found. Immunizations and Health Maintenance Health Maintenance  Topic Date  Due   COVID-19 Vaccine (3 - Moderna risk series) 11/12/2020   Bone Density Scan  07/01/2025   Mammogram  07/07/2025   Medicare Annual Wellness (AWV)  12/09/2025   Colonoscopy  09/13/2029   DTaP/Tdap/Td (3 - Td or Tdap) 05/24/2033   Pneumococcal Vaccine: 50+ Years  Completed   Influenza Vaccine  Completed   Hepatitis C Screening  Completed   Zoster Vaccines- Shingrix  Completed   Meningococcal B Vaccine  Aged Out        Assessment/Plan:  This is a routine wellness examination for Finley.  Patient Care Team: Sowles, Krichna, MD as PCP - General (Family Medicine) Darliss Rogue, MD as PCP - Cardiology (Cardiology) Portia Fireman, OD (Optometry) Devaughn Todd ORN, DC as Consulting Physician (Chiropractic Medicine) Aundria Ladell POUR, MD as Consulting Physician (Gastroenterology) Dasher, Alm LABOR, MD (Dermatology)  I have personally reviewed and noted the following in the patients chart:   Medical and social history Use of alcohol, tobacco or illicit drugs  Current medications and supplements including opioid prescriptions. Functional ability and status Nutritional status Physical activity Advanced directives List of other physicians Hospitalizations, surgeries, and ER visits in previous 12 months Vitals Screenings to include cognitive, depression, and falls Referrals and appointments  No orders of the defined types were placed in this encounter.  In addition, I have reviewed and discussed with patient certain preventive protocols, quality metrics, and best practice recommendations. A written personalized care plan for preventive services as well as general preventive health recommendations were provided to patient.   Jhonnie GORMAN Das, LPN   [id number]/76/7973   Return in about 1 year (around 12/09/2025).  After Visit Summary: (In Person-Printed) AVS printed and given to the patient  Nurse Notes: UTD ON SHOTS; UTD ON MAMMOGRAM & BDS & COLONOSCOPY   "

## 2024-12-09 NOTE — Patient Instructions (Addendum)
 [name],  Thank you for taking the time for your Medicare Wellness Visit. I appreciate your continued commitment to your health goals. Please review the care plan we discussed, and feel free to reach out if I can assist you further.  Please note that Annual Wellness Visits do not include a physical exam. Some assessments may be limited, especially if the visit was conducted virtually. If needed, we may recommend an in-person follow-up with your provider.  Ongoing Care Seeing your primary care provider every 3 to 6 months helps us  monitor your health and provide consistent, personalized care. 05/26/25 @ 9:00 AM APPT W/ DR.SOWLES  Referrals If a referral was made during today's visit and you haven't received any updates within two weeks, please contact the referred provider directly to check on the status.  Recommended Screenings:  Health Maintenance  Topic Date Due   COVID-19 Vaccine (3 - Moderna risk series) 11/12/2020   Medicare Annual Wellness Visit  05/14/2024   Osteoporosis screening with Bone Density Scan  07/01/2025   Breast Cancer Screening  07/07/2025   Colon Cancer Screening  09/13/2029   DTaP/Tdap/Td vaccine (3 - Td or Tdap) 05/24/2033   Pneumococcal Vaccine for age over 8  Completed   Flu Shot  Completed   Hepatitis C Screening  Completed   Zoster (Shingles) Vaccine  Completed   Meningitis B Vaccine  Aged Out     Vision: Annual vision screenings are recommended for early detection of glaucoma, cataracts, and diabetic retinopathy. These exams can also reveal signs of chronic conditions such as diabetes and high blood pressure.  Dental: Annual dental screenings help detect early signs of oral cancer, gum disease, and other conditions linked to overall health, including heart disease and diabetes.  Please see the attached documents for additional preventive care recommendations.   NEXT AWV 12/15/25 @ 2:20 PM IN PERSON

## 2025-05-26 ENCOUNTER — Ambulatory Visit: Admitting: Family Medicine

## 2025-12-15 ENCOUNTER — Ambulatory Visit
